# Patient Record
Sex: Male | Born: 1945 | Race: White | Hispanic: No | Marital: Married | Smoking: Never smoker
Health system: Southern US, Community
[De-identification: ages and names within clinical notes are randomized; demographics above are authoritative.]

## PROBLEM LIST (undated history)

## (undated) DIAGNOSIS — I10 Essential (primary) hypertension: Secondary | ICD-10-CM

## (undated) DIAGNOSIS — B001 Herpesviral vesicular dermatitis: Secondary | ICD-10-CM

## (undated) DIAGNOSIS — L570 Actinic keratosis: Secondary | ICD-10-CM

## (undated) DIAGNOSIS — Z6835 Body mass index (BMI) 35.0-35.9, adult: Secondary | ICD-10-CM

## (undated) HISTORY — DX: Herpesviral vesicular dermatitis: B00.1

## (undated) HISTORY — PX: COLONOSCOPY: SHX174

## (undated) HISTORY — DX: Actinic keratosis: L57.0

## (undated) HISTORY — PX: COLON SURGERY: SHX602

## (undated) HISTORY — PX: HERNIA REPAIR: SHX51

## (undated) HISTORY — DX: Essential (primary) hypertension: I10

## (undated) HISTORY — DX: Body mass index (BMI) 35.0-35.9, adult: Z68.35

---

## 2006-02-05 ENCOUNTER — Ambulatory Visit: Payer: Self-pay | Admitting: Gastroenterology

## 2007-05-28 ENCOUNTER — Ambulatory Visit: Payer: Self-pay | Admitting: Gastroenterology

## 2010-09-08 ENCOUNTER — Ambulatory Visit: Payer: Self-pay | Admitting: Emergency Medicine

## 2010-09-15 ENCOUNTER — Ambulatory Visit: Payer: Self-pay | Admitting: Emergency Medicine

## 2014-11-18 DIAGNOSIS — E669 Obesity, unspecified: Secondary | ICD-10-CM | POA: Insufficient documentation

## 2014-11-18 DIAGNOSIS — I10 Essential (primary) hypertension: Secondary | ICD-10-CM | POA: Insufficient documentation

## 2014-11-18 DIAGNOSIS — Z6835 Body mass index (BMI) 35.0-35.9, adult: Secondary | ICD-10-CM | POA: Insufficient documentation

## 2014-11-19 ENCOUNTER — Encounter: Payer: Self-pay | Admitting: Family Medicine

## 2014-11-24 ENCOUNTER — Ambulatory Visit (INDEPENDENT_AMBULATORY_CARE_PROVIDER_SITE_OTHER): Payer: BLUE CROSS/BLUE SHIELD | Admitting: Family Medicine

## 2014-11-24 ENCOUNTER — Encounter: Payer: Self-pay | Admitting: Family Medicine

## 2014-11-24 VITALS — BP 114/70 | HR 65 | Temp 99.1°F | Ht 68.0 in | Wt 233.0 lb

## 2014-11-24 DIAGNOSIS — L259 Unspecified contact dermatitis, unspecified cause: Secondary | ICD-10-CM | POA: Diagnosis not present

## 2014-11-24 DIAGNOSIS — L570 Actinic keratosis: Secondary | ICD-10-CM | POA: Diagnosis not present

## 2014-11-24 DIAGNOSIS — N401 Enlarged prostate with lower urinary tract symptoms: Secondary | ICD-10-CM | POA: Diagnosis not present

## 2014-11-24 DIAGNOSIS — Z0000 Encounter for general adult medical examination without abnormal findings: Secondary | ICD-10-CM

## 2014-11-24 DIAGNOSIS — I10 Essential (primary) hypertension: Secondary | ICD-10-CM

## 2014-11-24 DIAGNOSIS — N138 Other obstructive and reflux uropathy: Secondary | ICD-10-CM | POA: Insufficient documentation

## 2014-11-24 DIAGNOSIS — B001 Herpesviral vesicular dermatitis: Secondary | ICD-10-CM | POA: Diagnosis not present

## 2014-11-24 DIAGNOSIS — Z1211 Encounter for screening for malignant neoplasm of colon: Secondary | ICD-10-CM | POA: Diagnosis not present

## 2014-11-24 LAB — URINALYSIS, ROUTINE W REFLEX MICROSCOPIC
Bilirubin, UA: NEGATIVE
Glucose, UA: NEGATIVE
Ketones, UA: NEGATIVE
Leukocytes, UA: NEGATIVE
Nitrite, UA: NEGATIVE
Protein, UA: NEGATIVE
RBC, UA: NEGATIVE
Specific Gravity, UA: 1.015 (ref 1.005–1.030)
Urobilinogen, Ur: 0.2 mg/dL (ref 0.2–1.0)
pH, UA: 7 (ref 5.0–7.5)

## 2014-11-24 MED ORDER — TRIAMCINOLONE ACETONIDE 0.1 % EX CREA: 1.0000 "application " | TOPICAL_CREAM | Freq: Two times a day (BID) | CUTANEOUS | Status: DC

## 2014-11-24 MED ORDER — VALACYCLOVIR HCL 500 MG PO TABS: 1000.0000 mg | ORAL_TABLET | Freq: Two times a day (BID) | ORAL | Status: DC

## 2014-11-24 MED ORDER — BENAZEPRIL HCL 40 MG PO TABS
40.0000 mg | ORAL_TABLET | Freq: Every day | ORAL | Status: DC
Start: 2014-11-24 — End: 2015-11-30

## 2014-11-24 MED ORDER — TAMSULOSIN HCL 0.4 MG PO CAPS: 0.4000 mg | ORAL_CAPSULE | Freq: Every day | ORAL | Status: DC

## 2014-11-24 NOTE — Assessment & Plan Note (Signed)
Patient has nonspecific dermatitis from time to time uses triamcinolone cream on a rare basis

## 2014-11-24 NOTE — Assessment & Plan Note (Signed)
The current medical regimen is effective;  continue present plan and medications.  

## 2014-11-24 NOTE — Progress Notes (Signed)
BP 114/70 mmHg  Pulse 65  Temp(Src) 99.1 F (37.3 C)  Ht 5\' 8"  (1.727 m)  Wt 233 lb (105.688 kg)  BMI 35.44 kg/m2  SpO2 99%   Subjective:    Patient ID: Logan Willis, male    DOB: 01/28/1945, 69 y.o.   MRN: [id number]  HPI: Logan Willis is a 69 y.o. male  Chief Complaint  Patient presents with  . Annual Exam   Patient doing well for hypertension no complaints from medications taken faithfully. Patient does have nocturia gets up 3-4 times at night to urinate small amounts no frequency urgency dysuria Patient does have slow stream and slow to start.  Relevant past medical, surgical, family and social history reviewed and updated as indicated. Interim medical history since our last visit reviewed. Allergies and medications reviewed and updated.  Other than noted above Review of Systems  Constitutional: Negative.   HENT: Negative.   Eyes: Negative.   Respiratory: Negative.   Cardiovascular: Negative.   Gastrointestinal: Negative.   Endocrine: Negative.   Genitourinary: Negative.   Musculoskeletal: Negative.   Skin: Negative.   Allergic/Immunologic: Negative.   Neurological: Negative.   Hematological: Negative.   Psychiatric/Behavioral: Negative.     Per HPI unless specifically indicated above     Objective:    BP 114/70 mmHg  Pulse 65  Temp(Src) 99.1 F (37.3 C)  Ht 5\' 8"  (1.727 m)  Wt 233 lb (105.688 kg)  BMI 35.44 kg/m2  SpO2 99%  Wt Readings from Last 3 Encounters:  11/24/14 233 lb (105.688 kg)  05/05/14 233 lb (105.688 kg)    Physical Exam  Constitutional: He is oriented to person, place, and time. He appears well-developed and well-nourished.  HENT:  Head: Normocephalic and atraumatic.  Right Ear: External ear normal.  Left Ear: External ear normal.  Eyes: Conjunctivae and EOM are normal. Pupils are equal, round, and reactive to light.  Neck: Normal range of motion. Neck supple.  Cardiovascular: Normal rate, regular rhythm, normal heart  sounds and intact distal pulses.   Pulmonary/Chest: Effort normal and breath sounds normal.  Abdominal: Soft. Bowel sounds are normal. There is no splenomegaly or hepatomegaly.  Genitourinary: Rectum normal and penis normal.  Prostate enlarged  Musculoskeletal: Normal range of motion.  Neurological: He is alert and oriented to person, place, and time. He has normal reflexes.  Skin: No rash noted. No erythema.  Psychiatric: He has a normal mood and affect. His behavior is normal. Judgment and thought content normal.    No results found for this or any previous visit.    Assessment & Plan:   Problem List Items Addressed This Visit      Cardiovascular and Mediastinum   Essential hypertension    The current medical regimen is effective;  continue present plan and medications.       Relevant Medications   benazepril (LOTENSIN) 40 MG tablet   Other Relevant Orders   Comprehensive metabolic panel   Lipid panel   CBC with Differential/Platelet   TSH     Digestive   Cold sore    Uses Valtrex on a when necessary basis which works well      Relevant Medications   valACYclovir (VALTREX) 500 MG tablet     Musculoskeletal and Integument   Contact dermatitis    Patient has nonspecific dermatitis from time to time uses triamcinolone cream on a rare basis      Relevant Medications   triamcinolone cream (KENALOG) 0.1 %  Actinic keratosis    Due to multiple actinic keratosis will refer to dermatology Dr. Nehemiah Massed      Relevant Orders   Ambulatory referral to Dermatology     Genitourinary   BPH (benign prostatic hypertrophy) with urinary obstruction    Discuss with restriction at night and avoiding and use of medication Discussed Flomax and Proscar We will give prescription for patient to hold on Flomax      Relevant Medications   tamsulosin (FLOMAX) 0.4 MG CAPS capsule   Other Relevant Orders   Comprehensive metabolic panel   Lipid panel   CBC with  Differential/Platelet   PSA   Urinalysis, Routine w reflex microscopic (not at [facility])   TSH    Other Visit Diagnoses    Colon cancer screening    -  Primary    Relevant Orders    Ambulatory referral to General Surgery    PE (physical exam), annual            Follow up plan: Return in about 6 months (around 05/24/2015) for Blood pressure med check, BMP.

## 2014-11-24 NOTE — Assessment & Plan Note (Addendum)
Discuss with restriction at night and avoiding and use of medication Discussed Flomax and Proscar We will give prescription for patient to hold on Flomax

## 2014-11-24 NOTE — Assessment & Plan Note (Signed)
Due to multiple actinic keratosis will refer to dermatology Dr. Nehemiah Massed

## 2014-11-24 NOTE — Assessment & Plan Note (Signed)
Uses Valtrex on a when necessary basis which works well

## 2014-11-25 ENCOUNTER — Encounter: Payer: Self-pay | Admitting: Family Medicine

## 2014-11-25 ENCOUNTER — Other Ambulatory Visit: Payer: Self-pay

## 2014-11-25 ENCOUNTER — Telehealth: Payer: Self-pay

## 2014-11-25 LAB — CBC WITH DIFFERENTIAL/PLATELET
Basophils Absolute: 0 10*3/uL (ref 0.0–0.2)
Basos: 0 %
EOS (ABSOLUTE): 0.1 10*3/uL (ref 0.0–0.4)
Eos: 2 %
Hematocrit: 46 % (ref 37.5–51.0)
Hemoglobin: 15 g/dL (ref 12.6–17.7)
Immature Grans (Abs): 0 10*3/uL (ref 0.0–0.1)
Immature Granulocytes: 0 %
Lymphocytes Absolute: 1.1 10*3/uL (ref 0.7–3.1)
Lymphs: 26 %
MCH: 29.1 pg (ref 26.6–33.0)
MCHC: 32.6 g/dL (ref 31.5–35.7)
MCV: 89 fL (ref 79–97)
Monocytes Absolute: 0.3 10*3/uL (ref 0.1–0.9)
Monocytes: 7 %
Neutrophils Absolute: 2.7 10*3/uL (ref 1.4–7.0)
Neutrophils: 65 %
Platelets: 151 10*3/uL (ref 150–379)
RBC: 5.15 x10E6/uL (ref 4.14–5.80)
RDW: 14.7 % (ref 12.3–15.4)
WBC: 4.2 10*3/uL (ref 3.4–10.8)

## 2014-11-25 LAB — LIPID PANEL
Chol/HDL Ratio: 3.7 ratio units (ref 0.0–5.0)
Cholesterol, Total: 149 mg/dL (ref 100–199)
HDL: 40 mg/dL (ref >39)
LDL Calculated: 78 mg/dL (ref 0–99)
Triglycerides: 153 mg/dL — ABNORMAL HIGH (ref 0–149)
VLDL Cholesterol Cal: 31 mg/dL (ref 5–40)

## 2014-11-25 LAB — COMPREHENSIVE METABOLIC PANEL
ALT: 32 IU/L (ref 0–44)
AST: 18 IU/L (ref 0–40)
Albumin/Globulin Ratio: 2.2 (ref 1.1–2.5)
Albumin: 4.2 g/dL (ref 3.6–4.8)
Alkaline Phosphatase: 48 IU/L (ref 39–117)
BUN/Creatinine Ratio: 13 (ref 10–22)
BUN: 15 mg/dL (ref 8–27)
Bilirubin Total: 0.5 mg/dL (ref 0.0–1.2)
CO2: 26 mmol/L (ref 18–29)
Calcium: 9.3 mg/dL (ref 8.6–10.2)
Chloride: 102 mmol/L (ref 97–106)
Creatinine, Ser: 1.19 mg/dL (ref 0.76–1.27)
GFR calc Af Amer: 73 mL/min/{1.73_m2} (ref >59)
GFR calc non Af Amer: 63 mL/min/{1.73_m2} (ref >59)
Globulin, Total: 1.9 g/dL (ref 1.5–4.5)
Glucose: 80 mg/dL (ref 65–99)
Potassium: 4.8 mmol/L (ref 3.5–5.2)
Sodium: 142 mmol/L (ref 136–144)
Total Protein: 6.1 g/dL (ref 6.0–8.5)

## 2014-11-25 LAB — TSH: TSH: 2.35 u[IU]/mL (ref 0.450–4.500)

## 2014-11-25 LAB — PSA: Prostate Specific Ag, Serum: 1.9 ng/mL (ref 0.0–4.0)

## 2014-11-25 NOTE — Telephone Encounter (Signed)
Gastroenterology Pre-Procedure Review  Request Date: 12/10/14 Requesting Physician: Dr. Jeananne Rama  PATIENT REVIEW QUESTIONS: The patient responded to the following health history questions as indicated:    1. Are you having any GI issues? no 2. Do you have a personal history of Polyps? yes (repeat 3 years) 3. Do you have a family history of Colon Cancer or Polyps? no 4. Diabetes Mellitus? no 5. Joint replacements in the past 12 months?no 6. Major health problems in the past 3 months?no 7. Any artificial heart valves, MVP, or defibrillator?no    MEDICATIONS & ALLERGIES:    Patient reports the following regarding taking any anticoagulation/antiplatelet therapy:   Plavix, Coumadin, Eliquis, Xarelto, Lovenox, Pradaxa, Brilinta, or Effient? no Aspirin? yes (asa 81mg )  Patient confirms/reports the following medications:  Current Outpatient Prescriptions  Medication Sig Dispense Refill  . aspirin EC 81 MG tablet Take 81 mg by mouth daily.    . benazepril (LOTENSIN) 40 MG tablet Take 1 tablet (40 mg total) by mouth daily. 90 tablet 4  . tamsulosin (FLOMAX) 0.4 MG CAPS capsule Take 1 capsule (0.4 mg total) by mouth daily. 90 capsule 4  . triamcinolone cream (KENALOG) 0.1 % Apply 1 application topically 2 (two) times daily. 30 g 0  . valACYclovir (VALTREX) 500 MG tablet Take 2 tablets (1,000 mg total) by mouth 2 (two) times daily. 28 tablet 12   No current facility-administered medications for this visit.    Patient confirms/reports the following allergies:  No Known Allergies  No orders of the defined types were placed in this encounter.    AUTHORIZATION INFORMATION Primary Insurance: 1D#: Group #:  Secondary Insurance: 1D#: Group #:  SCHEDULE INFORMATION: Date: 12/10/14 Time: Location: [place]

## 2014-11-30 ENCOUNTER — Encounter: Payer: Self-pay | Admitting: *Deleted

## 2014-12-08 NOTE — Discharge Instructions (Signed)

## 2014-12-10 ENCOUNTER — Ambulatory Visit: Payer: BLUE CROSS/BLUE SHIELD | Admitting: Anesthesiology

## 2014-12-10 ENCOUNTER — Encounter
Admission: RE | Disposition: A | Discharge status: Home / Self Care | Payer: Self-pay | Source: Ambulatory Visit | Attending: Gastroenterology

## 2014-12-10 ENCOUNTER — Ambulatory Visit
Admission: RE | Admit: 2014-12-10 | Discharge: 2014-12-10 | Disposition: A | Discharge status: Home / Self Care | Payer: BLUE CROSS/BLUE SHIELD | Source: Ambulatory Visit | Attending: Gastroenterology | Admitting: Gastroenterology

## 2014-12-10 ENCOUNTER — Other Ambulatory Visit: Payer: Self-pay | Admitting: Gastroenterology

## 2014-12-10 DIAGNOSIS — D122 Benign neoplasm of ascending colon: Secondary | ICD-10-CM | POA: Insufficient documentation

## 2014-12-10 DIAGNOSIS — Z860100 Personal history of colon polyps, unspecified: Secondary | ICD-10-CM | POA: Insufficient documentation

## 2014-12-10 DIAGNOSIS — D123 Benign neoplasm of transverse colon: Secondary | ICD-10-CM

## 2014-12-10 DIAGNOSIS — Z1211 Encounter for screening for malignant neoplasm of colon: Secondary | ICD-10-CM | POA: Insufficient documentation

## 2014-12-10 DIAGNOSIS — D125 Benign neoplasm of sigmoid colon: Secondary | ICD-10-CM | POA: Insufficient documentation

## 2014-12-10 DIAGNOSIS — Z6835 Body mass index (BMI) 35.0-35.9, adult: Secondary | ICD-10-CM | POA: Insufficient documentation

## 2014-12-10 DIAGNOSIS — Z79899 Other long term (current) drug therapy: Secondary | ICD-10-CM | POA: Insufficient documentation

## 2014-12-10 DIAGNOSIS — D124 Benign neoplasm of descending colon: Secondary | ICD-10-CM | POA: Insufficient documentation

## 2014-12-10 DIAGNOSIS — Z7982 Long term (current) use of aspirin: Secondary | ICD-10-CM | POA: Diagnosis not present

## 2014-12-10 HISTORY — PX: COLONOSCOPY WITH PROPOFOL: SHX5780

## 2014-12-10 SURGERY — COLONOSCOPY WITH PROPOFOL
Anesthesia: Monitor Anesthesia Care | Wound class: Clean Contaminated

## 2014-12-10 MED ORDER — LACTATED RINGERS IV SOLN
INTRAVENOUS | Status: DC
  Administered 2014-12-10: 08:00:00 via INTRAVENOUS

## 2014-12-10 MED ORDER — LIDOCAINE HCL (CARDIAC) 20 MG/ML IV SOLN
INTRAVENOUS | Status: DC | PRN
  Administered 2014-12-10: 40 mg via INTRAVENOUS

## 2014-12-10 MED ORDER — ACETAMINOPHEN 325 MG PO TABS: 325.0000 mg | ORAL_TABLET | ORAL | Status: DC | PRN

## 2014-12-10 MED ORDER — ACETAMINOPHEN 160 MG/5ML PO SOLN: 325.0000 mg | ORAL | Status: DC | PRN

## 2014-12-10 MED ORDER — STERILE WATER FOR IRRIGATION IR SOLN
Status: DC | PRN
  Administered 2014-12-10: 200 mL

## 2014-12-10 MED ORDER — SODIUM CHLORIDE 0.9 % IV SOLN: INTRAVENOUS | Status: DC

## 2014-12-10 MED ORDER — PROPOFOL 10 MG/ML IV BOLUS
INTRAVENOUS | Status: DC | PRN
  Administered 2014-12-10 (×6): 20 mg via INTRAVENOUS

## 2014-12-10 SURGICAL SUPPLY — 30 items
CANISTER SUCT 1200ML W/VALVE (MISCELLANEOUS) ×2 IMPLANT
FCP ESCP3.2XJMB 240X2.8X (MISCELLANEOUS)
FORCEPS BIOP RAD 4 LRG CAP 4 (CUTTING FORCEPS) IMPLANT
FORCEPS BIOP RJ4 240 W/NDL (MISCELLANEOUS)
FORCEPS ESCP3.2XJMB 240X2.8X (MISCELLANEOUS) IMPLANT
GOWN CVR UNV OPN BCK APRN NK (MISCELLANEOUS) ×2 IMPLANT
GOWN ISOL THUMB LOOP REG UNIV (MISCELLANEOUS) ×2
HEMOCLIP INSTINCT (CLIP) IMPLANT
INJECTOR VARIJECT VIN23 (MISCELLANEOUS) IMPLANT
KIT CO2 TUBING (TUBING) ×2 IMPLANT
KIT DEFENDO VALVE AND CONN (KITS) IMPLANT
KIT ENDO PROCEDURE OLY (KITS) ×2 IMPLANT
LIGATOR MULTIBAND 6SHOOTER MBL (MISCELLANEOUS) IMPLANT
MARKER SPOT ENDO TATTOO 5ML (MISCELLANEOUS) IMPLANT
PAD GROUND ADULT SPLIT (MISCELLANEOUS) IMPLANT
RESOLUTION 360 CLIP ×2 IMPLANT
SNARE SHORT THROW 13M SML OVAL (MISCELLANEOUS) ×2 IMPLANT
SNARE SHORT THROW 30M LRG OVAL (MISCELLANEOUS) IMPLANT
SPOT EX ENDOSCOPIC TATTOO (MISCELLANEOUS)
SUCTION POLY TRAP 4CHAMBER (MISCELLANEOUS) IMPLANT
TRAP SUCTION POLY (MISCELLANEOUS) IMPLANT
TUBING CONN 6MMX3.1M (TUBING)
TUBING SUCTION CONN 0.25 STRL (TUBING) IMPLANT
UNDERPAD 30X60 958B10 (PK) (MISCELLANEOUS) IMPLANT
VALVE BIOPSY ENDO (VALVE) IMPLANT
VARIJECT INJECTOR VIN23 (MISCELLANEOUS)
WATER AUXILLARY (MISCELLANEOUS) IMPLANT
WATER STERILE IRR 250ML POUR (IV SOLUTION) ×2 IMPLANT
WATER STERILE IRR 500ML POUR (IV SOLUTION) ×2 IMPLANT
WIDE-EYE POLYPTRAP (MISCELLANEOUS) ×6 IMPLANT

## 2014-12-10 NOTE — Transfer of Care (Signed)
Immediate Anesthesia Transfer of Care Note  Patient: Logan Willis  Procedure(s) Performed: Procedure(s): COLONOSCOPY WITH PROPOFOL (N/A)  Patient Location: PACU  Anesthesia Type: MAC  Level of Consciousness: awake, alert  and patient cooperative  Airway and Oxygen Therapy: Patient Spontanous Breathing and Patient connected to supplemental oxygen  Post-op Assessment: Post-op Vital signs reviewed, Patient's Cardiovascular Status Stable, Respiratory Function Stable, Patent Airway and No signs of Nausea or vomiting  Post-op Vital Signs: Reviewed and stable  Complications: No apparent anesthesia complications

## 2014-12-10 NOTE — Op Note (Addendum)
[facility] Gastroenterology Patient Name: Logan Willis Procedure Date: 12/10/2014 7:54 AM MRN: [id number] Account #: [id number] Date of Birth: 1946/01/08 Admit Type: Outpatient Age: 69 Room: [facility] OR ROOM 01 Gender: Male Note Status: Supervisor Override Procedure:         Colonoscopy Indications:       High risk colon cancer surveillance: Personal history of                     colonic polyps Providers:         Lucilla Lame, MD Referring MD:      Guadalupe Maple, MD (Referring MD) Medicines:         Propofol per Anesthesia Complications:     No immediate complications. Procedure:         Pre-Anesthesia Assessment:                    - Prior to the procedure, a History and Physical was                     performed, and patient medications and allergies were                     reviewed. The patient's tolerance of previous anesthesia                     was also reviewed. The risks and benefits of the procedure                     and the sedation options and risks were discussed with the                     patient. All questions were answered, and informed consent                     was obtained. Prior Anticoagulants: The patient has taken                     no previous anticoagulant or antiplatelet agents. ASA                     Grade Assessment: II - A patient with mild systemic                     disease. After reviewing the risks and benefits, the                     patient was deemed in satisfactory condition to undergo                     the procedure.                    After obtaining informed consent, the colonoscope was                     passed under direct vision. Throughout the procedure, the                     patient's blood pressure, pulse, and oxygen saturations                     were monitored continuously. The [place]  colonoscope (S#: [id number]) was introduced through the anus                     and  advanced to the the ileocolonic anastomosis. The                     colonoscopy was performed without difficulty. The patient                     tolerated the procedure well. The quality of the bowel                     preparation was excellent. Findings:      The perianal and digital rectal examinations were normal.      Four sessile polyps were found in the ascending colon. The polyps were 4       to 9 mm in size. These polyps were removed with a cold snare. Resection       and retrieval were complete. To prevent bleeding post-intervention, one       hemostatic clip was successfully placed (MRI compatible). There was no       bleeding at the end of the procedure.      A 3 mm polyp was found in the sigmoid colon. The polyp was sessile. The       polyp was removed with a cold biopsy forceps. Resection and retrieval       were complete.      There was evidence of a prior end-to-side ileo-colonic anastomosis in       the transverse colon. This was patent. This was characterized by healthy       appearing mucosa. This was traversed. Impression:        - Four 4 to 9 mm polyps in the ascending colon. Resected                     and retrieved. MRI-compatible clip was placed.                    - One 3 mm polyp in the sigmoid colon. Resected and                     retrieved.                    - Patent end-to-side ileo-colonic anastomosis,                     characterized by healthy appearing mucosa. Recommendation:    - Await pathology results.                    - Repeat colonoscopy in 4 years for surveillance. Lucilla Lame, MD 12/10/2014 8:22:36 AM This report has been signed electronically. Number of Addenda: 0 Note Initiated On: 12/10/2014 7:54 AM Scope Withdrawal Time: 0 hours 9 minutes 52 seconds  Total Procedure Duration: 0 hours 15 minutes 41 seconds       [facility]

## 2014-12-10 NOTE — Anesthesia Postprocedure Evaluation (Signed)
Anesthesia Post Note  Patient: Logan Willis  Procedure(s) Performed: Procedure(s) (LRB): COLONOSCOPY WITH PROPOFOL (N/A)  Patient location during evaluation: PACU Anesthesia Type: MAC Level of consciousness: awake Pain management: pain level controlled Vital Signs Assessment: post-procedure vital signs reviewed and stable Respiratory status: spontaneous breathing Cardiovascular status: blood pressure returned to baseline Anesthetic complications: no    Trecia Rogers

## 2014-12-10 NOTE — H&P (Signed)
  [facility]  [address], [place], [place] [address] Phone: [phone] Fax : [phone]  Primary Care Physician:  Golden Pop, MD Primary Gastroenterologist:  Dr. Allen Norris  Pre-Procedure History & Physical: HPI:  Logan Willis is a 69 y.o. male is here for an colonoscopy.   Past Medical History  Diagnosis Date  . Body mass index 35.0-35.9, adult   . Cold sore     Past Surgical History  Procedure Laterality Date  . Hernia repair    . Colon surgery    . Colonoscopy      Prior to Admission medications   Medication Sig Start Date End Date Taking? Authorizing Provider  aspirin EC 81 MG tablet Take 81 mg by mouth daily.   Yes Historical Provider, MD  benazepril (LOTENSIN) 40 MG tablet Take 1 tablet (40 mg total) by mouth daily. 11/24/14  Yes Guadalupe Maple, MD  valACYclovir (VALTREX) 500 MG tablet Take 2 tablets (1,000 mg total) by mouth 2 (two) times daily. Patient taking differently: Take 1,000 mg by mouth 2 (two) times daily as needed.  11/24/14  Yes Guadalupe Maple, MD  tamsulosin (FLOMAX) 0.4 MG CAPS capsule Take 1 capsule (0.4 mg total) by mouth daily. Patient not taking: Reported on 11/25/2014 11/24/14   Guadalupe Maple, MD  triamcinolone cream (KENALOG) 0.1 % Apply 1 application topically 2 (two) times daily. Patient not taking: Reported on 12/10/2014 11/24/14   Guadalupe Maple, MD    Allergies as of 11/25/2014  . (No Known Allergies)    Family History  Problem Relation Age of Onset  . Heart disease Mother 13  . Heart disease Father 21  . Diabetes Sister     Social History   Social History  . Marital Status: Married    Spouse Name: N/A  . Number of Children: N/A  . Years of Education: N/A   Occupational History  . Not on file.   Social History Main Topics  . Smoking status: Never Smoker   . Smokeless tobacco: Never Used  . Alcohol Use: No  . Drug Use: No  . Sexual Activity: Not on file   Other Topics Concern  . Not on  file   Social History Narrative    Review of Systems: See HPI, otherwise negative ROS  Physical Exam: BP 121/75 mmHg  Pulse 75  Temp(Src) 97.9 F (36.6 C) (Temporal)  Resp 16  Ht 5\' 8"  (1.727 m)  Wt 227 lb (102.967 kg)  BMI 34.52 kg/m2  SpO2 98% General:   Alert,  pleasant and cooperative in NAD Head:  Normocephalic and atraumatic. Neck:  Supple; no masses or thyromegaly. Lungs:  Clear throughout to auscultation.    Heart:  Regular rate and rhythm. Abdomen:  Soft, nontender and nondistended. Normal bowel sounds, without guarding, and without rebound.   Neurologic:  Alert and  oriented x4;  grossly normal neurologically.  Impression/Plan: Logan Willis is here for an colonoscopy to be performed for history of colon polyps  Risks, benefits, limitations, and alternatives regarding  colonoscopy have been reviewed with the patient.  Questions have been answered.  All parties agreeable.   Ollen Bowl, MD  12/10/2014, 7:27 AM

## 2014-12-10 NOTE — Anesthesia Procedure Notes (Signed)
Procedure Name: MAC Performed by: Georga Bora Pre-anesthesia Checklist: Patient identified, Emergency Drugs available, Suction available, Patient being monitored and Timeout performed Patient Re-evaluated:Patient Re-evaluated prior to inductionOxygen Delivery Method: Nasal cannula Placement Confirmation: positive ETCO2

## 2014-12-10 NOTE — Anesthesia Preprocedure Evaluation (Signed)
Anesthesia Evaluation  Patient identified by MRN, date of birth, ID band Patient awake and Patient confused    Reviewed: Allergy & Precautions, H&P , NPO status , Patient's Chart, lab work & pertinent test results, Unable to perform ROS - Chart review only  Airway Mallampati: I  TM Distance: >3 FB Neck ROM: full  Mouth opening: Limited Mouth Opening  Dental no notable dental hx.    Pulmonary neg pulmonary ROS,    Pulmonary exam normal        Cardiovascular hypertension, Normal cardiovascular exam     Neuro/Psych negative neurological ROS  negative psych ROS   GI/Hepatic negative GI ROS, Neg liver ROS,   Endo/Other  negative endocrine ROS  Renal/GU negative Renal ROS  negative genitourinary   Musculoskeletal   Abdominal   Peds  Hematology negative hematology ROS (+)   Anesthesia Other Findings   Reproductive/Obstetrics negative OB ROS                             Anesthesia Physical Anesthesia Plan  ASA: II  Anesthesia Plan: MAC   Post-op Pain Management:    Induction: Intravenous  Airway Management Planned: Nasal Cannula  Additional Equipment:   Intra-op Plan:   Post-operative Plan:   Informed Consent: I have reviewed the patients History and Physical, chart, labs and discussed the procedure including the risks, benefits and alternatives for the proposed anesthesia with the patient or authorized representative who has indicated his/her understanding and acceptance.     Plan Discussed with: CRNA  Anesthesia Plan Comments:         Anesthesia Quick Evaluation

## 2014-12-10 NOTE — OR Nursing (Signed)
PATIENT HAS HAD SURGERY, HAS NO CECUM, REACHED EXTENT/ ANASTOMOSIS AT 0807.

## 2014-12-11 ENCOUNTER — Encounter: Payer: Self-pay | Admitting: Gastroenterology

## 2014-12-15 ENCOUNTER — Encounter: Payer: Self-pay | Admitting: Gastroenterology

## 2015-05-24 ENCOUNTER — Ambulatory Visit (INDEPENDENT_AMBULATORY_CARE_PROVIDER_SITE_OTHER): Payer: BLUE CROSS/BLUE SHIELD | Admitting: Family Medicine

## 2015-05-24 ENCOUNTER — Encounter: Payer: Self-pay | Admitting: Family Medicine

## 2015-05-24 VITALS — BP 120/62 | HR 66 | Temp 98.0°F | Ht 68.1 in | Wt 228.0 lb

## 2015-05-24 DIAGNOSIS — N138 Other obstructive and reflux uropathy: Secondary | ICD-10-CM

## 2015-05-24 DIAGNOSIS — N401 Enlarged prostate with lower urinary tract symptoms: Secondary | ICD-10-CM | POA: Diagnosis not present

## 2015-05-24 DIAGNOSIS — Z0000 Encounter for general adult medical examination without abnormal findings: Secondary | ICD-10-CM | POA: Diagnosis not present

## 2015-05-24 DIAGNOSIS — I10 Essential (primary) hypertension: Secondary | ICD-10-CM | POA: Diagnosis not present

## 2015-05-24 NOTE — Progress Notes (Signed)
   BP 120/62 mmHg  Pulse 66  Temp(Src) 98 F (36.7 C)  Ht 5' 8.1" (1.73 m)  Wt 228 lb (103.42 kg)  BMI 34.56 kg/m2  SpO2 98%   Subjective:    Patient ID: Logan Willis, male    DOB: 22-Apr-1945, 70 y.o.   MRN: [id number]  HPI: Logan Willis is a 70 y.o. male  Chief Complaint  Patient presents with  . Hypertension  Patient recheck hypertension complaints from Benzapril no lightheaded dizzy so otherwise feeling well. Takes medicines faithfully without side effects. Patient has some BPH with lower urinary tract symptoms never started tamsulosin is ready to do so still having symptoms. Has lost 5 pounds and doing well feeling good On some psoriasis issues triamcinolone helps but still has some itching skin problems is seen dermatologist for actinic keratosis has follow-up appointment in August will check again on other treatments for psoriasis. Uses triamcinolone sparingly.  Relevant past medical, surgical, family and social history reviewed and updated as indicated. Interim medical history since our last visit reviewed. Allergies and medications reviewed and updated.  Review of Systems  Constitutional: Negative.   Respiratory: Negative.   Cardiovascular: Negative.   Genitourinary: Positive for difficulty urinating.    Per HPI unless specifically indicated above     Objective:    BP 120/62 mmHg  Pulse 66  Temp(Src) 98 F (36.7 C)  Ht 5' 8.1" (1.73 m)  Wt 228 lb (103.42 kg)  BMI 34.56 kg/m2  SpO2 98%  Wt Readings from Last 3 Encounters:  05/24/15 228 lb (103.42 kg)  12/10/14 227 lb (102.967 kg)  11/24/14 233 lb (105.688 kg)    Physical Exam  Constitutional: He is oriented to person, place, and time. He appears well-developed and well-nourished. No distress.  HENT:  Head: Normocephalic and atraumatic.  Right Ear: Hearing normal.  Left Ear: Hearing normal.  Nose: Nose normal.  Eyes: Conjunctivae and lids are normal. Right eye exhibits no discharge. Left eye  exhibits no discharge. No scleral icterus.  Cardiovascular: Normal rate, regular rhythm and normal heart sounds.   Pulmonary/Chest: Effort normal and breath sounds normal. No respiratory distress.  Musculoskeletal: Normal range of motion.  Neurological: He is alert and oriented to person, place, and time.  Skin: Skin is intact.  Slight psoriasis changes on legs  Psychiatric: He has a normal mood and affect. His speech is normal and behavior is normal. Judgment and thought content normal. Cognition and memory are normal.        Assessment & Plan:   Problem List Items Addressed This Visit      Cardiovascular and Mediastinum   Essential hypertension - Primary    The current medical regimen is effective;  continue present plan and medications.       Relevant Orders   Basic metabolic panel     Genitourinary   BPH (benign prostatic hypertrophy) with urinary obstruction    Will start tamsulosin and observe symptoms       Other Visit Diagnoses    Healthcare maintenance        Relevant Orders    Hepatitis C Antibody     will continue to use triamcinolone when necessary and sparingly also discuss with dermatology at next visit.  Follow up plan: Return in about 6 months (around 11/24/2015) for Physical Exam.

## 2015-05-24 NOTE — Assessment & Plan Note (Signed)
The current medical regimen is effective;  continue present plan and medications.  

## 2015-05-24 NOTE — Assessment & Plan Note (Signed)
Will start tamsulosin and observe symptoms

## 2015-05-25 ENCOUNTER — Encounter: Payer: Self-pay | Admitting: Family Medicine

## 2015-05-25 LAB — BASIC METABOLIC PANEL
BUN/Creatinine Ratio: 18 (ref 10–24)
BUN: 22 mg/dL (ref 8–27)
CO2: 24 mmol/L (ref 18–29)
Calcium: 9.2 mg/dL (ref 8.6–10.2)
Chloride: 102 mmol/L (ref 96–106)
Creatinine, Ser: 1.19 mg/dL (ref 0.76–1.27)
GFR calc Af Amer: 73 mL/min/{1.73_m2} (ref >59)
GFR calc non Af Amer: 63 mL/min/{1.73_m2} (ref >59)
Glucose: 71 mg/dL (ref 65–99)
Potassium: 4.5 mmol/L (ref 3.5–5.2)
Sodium: 140 mmol/L (ref 134–144)

## 2015-05-25 LAB — HEPATITIS C ANTIBODY: Hep C Virus Ab: <0.1 s/co ratio (ref 0.0–0.9)

## 2015-11-17 ENCOUNTER — Encounter (INDEPENDENT_AMBULATORY_CARE_PROVIDER_SITE_OTHER): Payer: Self-pay

## 2015-11-30 ENCOUNTER — Ambulatory Visit (INDEPENDENT_AMBULATORY_CARE_PROVIDER_SITE_OTHER): Payer: BLUE CROSS/BLUE SHIELD | Admitting: Family Medicine

## 2015-11-30 ENCOUNTER — Encounter: Payer: Self-pay | Admitting: Family Medicine

## 2015-11-30 VITALS — BP 123/76 | HR 61 | Temp 97.6°F | Ht 68.0 in | Wt 229.0 lb

## 2015-11-30 DIAGNOSIS — Z0000 Encounter for general adult medical examination without abnormal findings: Secondary | ICD-10-CM

## 2015-11-30 DIAGNOSIS — I10 Essential (primary) hypertension: Secondary | ICD-10-CM

## 2015-11-30 DIAGNOSIS — N401 Enlarged prostate with lower urinary tract symptoms: Secondary | ICD-10-CM

## 2015-11-30 DIAGNOSIS — Z23 Encounter for immunization: Secondary | ICD-10-CM | POA: Diagnosis not present

## 2015-11-30 DIAGNOSIS — L259 Unspecified contact dermatitis, unspecified cause: Secondary | ICD-10-CM

## 2015-11-30 DIAGNOSIS — Z6835 Body mass index (BMI) 35.0-35.9, adult: Secondary | ICD-10-CM

## 2015-11-30 DIAGNOSIS — B001 Herpesviral vesicular dermatitis: Secondary | ICD-10-CM

## 2015-11-30 DIAGNOSIS — N138 Other obstructive and reflux uropathy: Secondary | ICD-10-CM | POA: Diagnosis not present

## 2015-11-30 LAB — URINALYSIS, ROUTINE W REFLEX MICROSCOPIC
Bilirubin, UA: NEGATIVE
Glucose, UA: NEGATIVE
Ketones, UA: NEGATIVE
Leukocytes, UA: NEGATIVE
Nitrite, UA: NEGATIVE
Protein, UA: NEGATIVE
RBC, UA: NEGATIVE
Specific Gravity, UA: 1.01 (ref 1.005–1.030)
Urobilinogen, Ur: 0.2 mg/dL (ref 0.2–1.0)
pH, UA: 6.5 (ref 5.0–7.5)

## 2015-11-30 MED ORDER — TRIAMCINOLONE ACETONIDE 0.1 % EX CREA: 1.0000 "application " | TOPICAL_CREAM | Freq: Two times a day (BID) | CUTANEOUS | 0 refills | Status: DC

## 2015-11-30 MED ORDER — VALACYCLOVIR HCL 500 MG PO TABS
1000.0000 mg | ORAL_TABLET | Freq: Two times a day (BID) | ORAL | 12 refills | Status: DC
Start: 2015-11-30 — End: 2017-01-29

## 2015-11-30 MED ORDER — BENAZEPRIL HCL 40 MG PO TABS: 40.0000 mg | ORAL_TABLET | Freq: Every day | ORAL | 4 refills | Status: DC

## 2015-11-30 NOTE — Assessment & Plan Note (Signed)
Doing well now does not want meds

## 2015-11-30 NOTE — Assessment & Plan Note (Signed)
The current medical regimen is effective;  continue present plan and medications.  

## 2015-11-30 NOTE — Assessment & Plan Note (Signed)
Discuss wt loss 

## 2015-11-30 NOTE — Progress Notes (Signed)
BP 123/76   Pulse 61   Temp 97.6 F (36.4 C)   Ht 5\' 8"  (1.727 m)   Wt 229 lb (103.9 kg)   SpO2 99%   BMI 34.82 kg/m    Subjective:    Patient ID: Logan Willis, male    DOB: [birth year], 70 y.o.   MRN: [id number]  HPI: Logan Willis is a 70 y.o. male  Chief Complaint  Patient presents with  . Annual Exam  Patient all in all doing well had some allergy issues this summer. Stopped taking Flomax as didn't do anything and may be aggravating his allergy symptoms. Didn't notice whether was doing any good or not so hasn't restarted the medicine. Blood pressure doing well with Benzapril no side effects no issues Also discussed sleep apnea patient currently doesn't have any symptoms but will watch for any changes.  Relevant past medical, surgical, family and social history reviewed and updated as indicated. Interim medical history since our last visit reviewed. Allergies and medications reviewed and updated.  Review of Systems  Constitutional: Negative.   HENT: Negative.   Eyes: Negative.   Respiratory: Negative.   Cardiovascular: Negative.   Gastrointestinal: Negative.   Endocrine: Negative.   Genitourinary: Negative.   Musculoskeletal: Negative.   Skin: Negative.   Allergic/Immunologic: Negative.   Neurological: Negative.   Hematological: Negative.   Psychiatric/Behavioral: Negative.     Per HPI unless specifically indicated above     Objective:    BP 123/76   Pulse 61   Temp 97.6 F (36.4 C)   Ht 5\' 8"  (1.727 m)   Wt 229 lb (103.9 kg)   SpO2 99%   BMI 34.82 kg/m   Wt Readings from Last 3 Encounters:  11/30/15 229 lb (103.9 kg)  05/24/15 228 lb (103.4 kg)  12/10/14 227 lb (103 kg)    Physical Exam  Constitutional: He is oriented to person, place, and time. He appears well-developed and well-nourished.  HENT:  Head: Normocephalic and atraumatic.  Right Ear: External ear normal.  Left Ear: External ear normal.  Eyes: Conjunctivae and EOM are normal.  Pupils are equal, round, and reactive to light.  Neck: Normal range of motion. Neck supple.  Cardiovascular: Normal rate, regular rhythm, normal heart sounds and intact distal pulses.   Pulmonary/Chest: Effort normal and breath sounds normal.  Abdominal: Soft. Bowel sounds are normal. There is no splenomegaly or hepatomegaly.  Genitourinary: Rectum normal and penis normal.  Genitourinary Comments: Enlarged prostate  Musculoskeletal: Normal range of motion.  Neurological: He is alert and oriented to person, place, and time. He has normal reflexes.  Skin: No rash noted. No erythema.  Psychiatric: He has a normal mood and affect. His behavior is normal. Judgment and thought content normal.    Results for orders placed or performed in visit on [id number]  Basic metabolic panel  Result Value Ref Range   Glucose 71 65 - 99 mg/dL   BUN 22 8 - 27 mg/dL   Creatinine, Ser 1.19 0.76 - 1.27 mg/dL   GFR calc non Af Amer 63 >59 mL/min/1.73   GFR calc Af Amer 73 >59 mL/min/1.73   BUN/Creatinine Ratio 18 10 - 24   Sodium 140 134 - 144 mmol/L   Potassium 4.5 3.5 - 5.2 mmol/L   Chloride 102 96 - 106 mmol/L   CO2 24 18 - 29 mmol/L   Calcium 9.2 8.6 - 10.2 mg/dL  Hepatitis C Antibody  Result Value Ref Range   Hep  C Virus Ab <0.1 0.0 - 0.9 s/co ratio      Assessment & Plan:   Problem List Items Addressed This Visit      Cardiovascular and Mediastinum   Essential hypertension - Primary    The current medical regimen is effective;  continue present plan and medications.       Relevant Medications   benazepril (LOTENSIN) 40 MG tablet   Other Relevant Orders   CBC with Differential/Platelet   Comprehensive metabolic panel   Lipid Profile   Urinalysis, Routine w reflex microscopic (not at [facility])   PSA   TSH     Digestive   Cold sore   Relevant Medications   valACYclovir (VALTREX) 500 MG tablet     Musculoskeletal and Integument   Contact dermatitis   Relevant Medications    triamcinolone cream (KENALOG) 0.1 %     Genitourinary   Benign prostatic hyperplasia with urinary obstruction    Doing well now does not want meds        Other   Body mass index 35.0-35.9, adult    Discuss wt loss       Other Visit Diagnoses    PE (physical exam), annual       Need for diphtheria-tetanus-pertussis (Tdap) vaccine       Relevant Orders   Tdap vaccine greater than or equal to 7yo IM (Completed)       Follow up plan: Return for BMP.

## 2015-12-01 ENCOUNTER — Encounter: Payer: Self-pay | Admitting: Family Medicine

## 2015-12-01 LAB — COMPREHENSIVE METABOLIC PANEL
ALT: 23 IU/L (ref 0–44)
AST: 17 IU/L (ref 0–40)
Albumin/Globulin Ratio: 2.4 — ABNORMAL HIGH (ref 1.2–2.2)
Albumin: 4.6 g/dL (ref 3.6–4.8)
Alkaline Phosphatase: 58 IU/L (ref 39–117)
BUN/Creatinine Ratio: 16 (ref 10–24)
BUN: 18 mg/dL (ref 8–27)
Bilirubin Total: 0.5 mg/dL (ref 0.0–1.2)
CO2: 25 mmol/L (ref 18–29)
Calcium: 9.2 mg/dL (ref 8.6–10.2)
Chloride: 103 mmol/L (ref 96–106)
Creatinine, Ser: 1.1 mg/dL (ref 0.76–1.27)
GFR calc Af Amer: 79 mL/min/{1.73_m2} (ref >59)
GFR calc non Af Amer: 69 mL/min/{1.73_m2} (ref >59)
Globulin, Total: 1.9 g/dL (ref 1.5–4.5)
Glucose: 64 mg/dL — ABNORMAL LOW (ref 65–99)
Potassium: 4.1 mmol/L (ref 3.5–5.2)
Sodium: 145 mmol/L — ABNORMAL HIGH (ref 134–144)
Total Protein: 6.5 g/dL (ref 6.0–8.5)

## 2015-12-01 LAB — CBC WITH DIFFERENTIAL/PLATELET
Basophils Absolute: 0 10*3/uL (ref 0.0–0.2)
Basos: 0 %
EOS (ABSOLUTE): 0.1 10*3/uL (ref 0.0–0.4)
Eos: 2 %
Hematocrit: 44 % (ref 37.5–51.0)
Hemoglobin: 14.9 g/dL (ref 12.6–17.7)
Immature Grans (Abs): 0 10*3/uL (ref 0.0–0.1)
Immature Granulocytes: 0 %
Lymphocytes Absolute: 1.2 10*3/uL (ref 0.7–3.1)
Lymphs: 26 %
MCH: 28.8 pg (ref 26.6–33.0)
MCHC: 33.9 g/dL (ref 31.5–35.7)
MCV: 85 fL (ref 79–97)
Monocytes Absolute: 0.5 10*3/uL (ref 0.1–0.9)
Monocytes: 11 %
Neutrophils Absolute: 2.7 10*3/uL (ref 1.4–7.0)
Neutrophils: 61 %
Platelets: 166 10*3/uL (ref 150–379)
RBC: 5.17 x10E6/uL (ref 4.14–5.80)
RDW: 13.8 % (ref 12.3–15.4)
WBC: 4.5 10*3/uL (ref 3.4–10.8)

## 2015-12-01 LAB — PSA: Prostate Specific Ag, Serum: 1.9 ng/mL (ref 0.0–4.0)

## 2015-12-01 LAB — LIPID PANEL
Chol/HDL Ratio: 3.9 ratio units (ref 0.0–5.0)
Cholesterol, Total: 157 mg/dL (ref 100–199)
HDL: 40 mg/dL (ref >39)
LDL Calculated: 94 mg/dL (ref 0–99)
Triglycerides: 115 mg/dL (ref 0–149)
VLDL Cholesterol Cal: 23 mg/dL (ref 5–40)

## 2015-12-01 LAB — TSH: TSH: 1.65 u[IU]/mL (ref 0.450–4.500)

## 2016-02-03 ENCOUNTER — Other Ambulatory Visit: Payer: Self-pay | Admitting: Family Medicine

## 2016-02-03 DIAGNOSIS — N401 Enlarged prostate with lower urinary tract symptoms: Principal | ICD-10-CM

## 2016-02-03 DIAGNOSIS — N138 Other obstructive and reflux uropathy: Secondary | ICD-10-CM

## 2016-02-05 ENCOUNTER — Other Ambulatory Visit: Payer: Self-pay | Admitting: Family Medicine

## 2016-02-05 DIAGNOSIS — I10 Essential (primary) hypertension: Secondary | ICD-10-CM

## 2016-02-09 ENCOUNTER — Other Ambulatory Visit: Payer: Self-pay | Admitting: Family Medicine

## 2016-02-09 DIAGNOSIS — L259 Unspecified contact dermatitis, unspecified cause: Secondary | ICD-10-CM

## 2016-02-09 NOTE — Telephone Encounter (Signed)
Routing to provider. Next appt 05/30/16.

## 2016-02-18 ENCOUNTER — Other Ambulatory Visit: Payer: Self-pay | Admitting: Family Medicine

## 2016-02-18 DIAGNOSIS — I10 Essential (primary) hypertension: Secondary | ICD-10-CM

## 2016-02-18 NOTE — Telephone Encounter (Signed)
La11st OV: 11/30/15 Next OV: 05/30/16  BMP Latest Ref Rng & Units 11/30/2015 05/24/2015 11/24/2014  Glucose 65 - 99 mg/dL 64(L) 71 80  BUN 8 - 27 mg/dL 18 22 15   Creatinine 0.76 - 1.27 mg/dL 1.10 1.19 1.19  BUN/Creat Ratio 10 - 24 16 18 13   Sodium 134 - 144 mmol/L 145(H) 140 142  Potassium 3.5 - 5.2 mmol/L 4.1 4.5 4.8  Chloride 96 - 106 mmol/L 103 102 102  CO2 18 - 29 mmol/L 25 24 26   Calcium 8.6 - 10.2 mg/dL 9.2 9.2 9.3

## 2016-05-30 ENCOUNTER — Ambulatory Visit (INDEPENDENT_AMBULATORY_CARE_PROVIDER_SITE_OTHER): Payer: BLUE CROSS/BLUE SHIELD | Admitting: Family Medicine

## 2016-05-30 ENCOUNTER — Encounter: Payer: Self-pay | Admitting: Family Medicine

## 2016-05-30 VITALS — BP 107/71 | HR 68 | Ht 68.0 in | Wt 233.0 lb

## 2016-05-30 DIAGNOSIS — N138 Other obstructive and reflux uropathy: Secondary | ICD-10-CM

## 2016-05-30 DIAGNOSIS — I10 Essential (primary) hypertension: Secondary | ICD-10-CM

## 2016-05-30 DIAGNOSIS — N401 Enlarged prostate with lower urinary tract symptoms: Secondary | ICD-10-CM

## 2016-05-30 DIAGNOSIS — L259 Unspecified contact dermatitis, unspecified cause: Secondary | ICD-10-CM

## 2016-05-30 MED ORDER — TAMSULOSIN HCL 0.4 MG PO CAPS: 0.4000 mg | ORAL_CAPSULE | Freq: Every day | ORAL | 2 refills | Status: DC

## 2016-05-30 MED ORDER — TRIAMCINOLONE ACETONIDE 0.1 % EX CREA: 1.0000 "application " | TOPICAL_CREAM | Freq: Two times a day (BID) | CUTANEOUS | 0 refills | Status: DC

## 2016-05-30 NOTE — Assessment & Plan Note (Signed)
Still using triamcinolone when necessary on various spots he gets especially on his legs these itch and then resolve with intermittent use of triamcinolone.

## 2016-05-30 NOTE — Assessment & Plan Note (Signed)
The current medical regimen is effective;  continue present plan and medications.  

## 2016-05-30 NOTE — Progress Notes (Signed)
BP 107/71   Pulse 68   Ht 5\' 8"  (1.727 m)   Wt 233 lb (105.7 kg)   SpO2 98%   BMI 35.43 kg/m    Subjective:    Patient ID: Logan Willis, male    DOB: 01/12/1945, 71 y.o.   MRN: [id number]  HPI: Logan Willis is a 71 y.o. male  Chief Complaint  Patient presents with  . Follow-up  . Hypertension  Patient doing well with Benzapril no complaints takes medications faithfully without problems and good blood pressure control no low blood pressure high blood pressure excursions. Uses triamcinolone when necessary especially on leg lesions and does good control. Taking Allegra for congestion and allergies which helps Relevant past medical, surgical, family and social history reviewed and updated as indicated. Interim medical history since our last visit reviewed. Allergies and medications reviewed and updated.  Review of Systems  Constitutional: Negative.   Respiratory: Negative.   Cardiovascular: Negative.     Per HPI unless specifically indicated above     Objective:    BP 107/71   Pulse 68   Ht 5\' 8"  (1.727 m)   Wt 233 lb (105.7 kg)   SpO2 98%   BMI 35.43 kg/m   Wt Readings from Last 3 Encounters:  05/30/16 233 lb (105.7 kg)  11/30/15 229 lb (103.9 kg)  05/24/15 228 lb (103.4 kg)    Physical Exam  Constitutional: He is oriented to person, place, and time. He appears well-developed and well-nourished.  HENT:  Head: Normocephalic and atraumatic.  Eyes: Conjunctivae and EOM are normal.  Neck: Normal range of motion.  Cardiovascular: Normal rate, regular rhythm and normal heart sounds.   Pulmonary/Chest: Effort normal and breath sounds normal.  Musculoskeletal: Normal range of motion.  Neurological: He is alert and oriented to person, place, and time.  Skin: No erythema.  Psychiatric: He has a normal mood and affect. His behavior is normal. Judgment and thought content normal.    Results for orders placed or performed in visit on 11/30/15  CBC with  Differential/Platelet  Result Value Ref Range   WBC 4.5 3.4 - 10.8 x10E3/uL   RBC 5.17 4.14 - 5.80 x10E6/uL   Hemoglobin 14.9 12.6 - 17.7 g/dL   Hematocrit 44.0 37.5 - 51.0 %   MCV 85 79 - 97 fL   MCH 28.8 26.6 - 33.0 pg   MCHC 33.9 31.5 - 35.7 g/dL   RDW 13.8 12.3 - 15.4 %   Platelets 166 150 - 379 x10E3/uL   Neutrophils 61 Not Estab. %   Lymphs 26 Not Estab. %   Monocytes 11 Not Estab. %   Eos 2 Not Estab. %   Basos 0 Not Estab. %   Neutrophils Absolute 2.7 1.4 - 7.0 x10E3/uL   Lymphocytes Absolute 1.2 0.7 - 3.1 x10E3/uL   Monocytes Absolute 0.5 0.1 - 0.9 x10E3/uL   EOS (ABSOLUTE) 0.1 0.0 - 0.4 x10E3/uL   Basophils Absolute 0.0 0.0 - 0.2 x10E3/uL   Immature Granulocytes 0 Not Estab. %   Immature Grans (Abs) 0.0 0.0 - 0.1 x10E3/uL  Comprehensive metabolic panel  Result Value Ref Range   Glucose 64 (L) 65 - 99 mg/dL   BUN 18 8 - 27 mg/dL   Creatinine, Ser 1.10 0.76 - 1.27 mg/dL   GFR calc non Af Amer 69 >59 mL/min/1.73   GFR calc Af Amer 79 >59 mL/min/1.73   BUN/Creatinine Ratio 16 10 - 24   Sodium 145 (H) 134 - 144  mmol/L   Potassium 4.1 3.5 - 5.2 mmol/L   Chloride 103 96 - 106 mmol/L   CO2 25 18 - 29 mmol/L   Calcium 9.2 8.6 - 10.2 mg/dL   Total Protein 6.5 6.0 - 8.5 g/dL   Albumin 4.6 3.6 - 4.8 g/dL   Globulin, Total 1.9 1.5 - 4.5 g/dL   Albumin/Globulin Ratio 2.4 (H) 1.2 - 2.2   Bilirubin Total 0.5 0.0 - 1.2 mg/dL   Alkaline Phosphatase 58 39 - 117 IU/L   AST 17 0 - 40 IU/L   ALT 23 0 - 44 IU/L  Lipid Profile  Result Value Ref Range   Cholesterol, Total 157 100 - 199 mg/dL   Triglycerides 115 0 - 149 mg/dL   HDL 40 >39 mg/dL   VLDL Cholesterol Cal 23 5 - 40 mg/dL   LDL Calculated 94 0 - 99 mg/dL   Chol/HDL Ratio 3.9 0.0 - 5.0 ratio units  Urinalysis, Routine w reflex microscopic (not at [facility])  Result Value Ref Range   Specific Gravity, UA 1.010 1.005 - 1.030   pH, UA 6.5 5.0 - 7.5   Color, UA Yellow Yellow   Appearance Ur Clear Clear   Leukocytes, UA  Negative Negative   Protein, UA Negative Negative/Trace   Glucose, UA Negative Negative   Ketones, UA Negative Negative   RBC, UA Negative Negative   Bilirubin, UA Negative Negative   Urobilinogen, Ur 0.2 0.2 - 1.0 mg/dL   Nitrite, UA Negative Negative  PSA  Result Value Ref Range   Prostate Specific Ag, Serum 1.9 0.0 - 4.0 ng/mL  TSH  Result Value Ref Range   TSH 1.650 0.450 - 4.500 uIU/mL      Assessment & Plan:   Problem List Items Addressed This Visit      Cardiovascular and Mediastinum   Essential hypertension - Primary    The current medical regimen is effective;  continue present plan and medications.       Relevant Orders   Basic metabolic panel     Musculoskeletal and Integument   Contact dermatitis    Still using triamcinolone when necessary on various spots he gets especially on his legs these itch and then resolve with intermittent use of triamcinolone.      Relevant Medications   triamcinolone cream (KENALOG) 0.1 %     Genitourinary   Benign prostatic hyperplasia with urinary obstruction   Relevant Medications   tamsulosin (FLOMAX) 0.4 MG CAPS capsule     discuss weight loss diet exercise nutrition  Follow up plan: Return in about 6 months (around 11/30/2016) for Physical Exam.

## 2016-05-31 ENCOUNTER — Encounter: Payer: Self-pay | Admitting: Family Medicine

## 2016-05-31 LAB — BASIC METABOLIC PANEL
BUN/Creatinine Ratio: 15 (ref 10–24)
BUN: 19 mg/dL (ref 8–27)
CO2: 25 mmol/L (ref 18–29)
Calcium: 9.2 mg/dL (ref 8.6–10.2)
Chloride: 104 mmol/L (ref 96–106)
Creatinine, Ser: 1.24 mg/dL (ref 0.76–1.27)
GFR calc Af Amer: 69 mL/min/{1.73_m2} (ref >59)
GFR calc non Af Amer: 59 mL/min/{1.73_m2} — ABNORMAL LOW (ref >59)
Glucose: 105 mg/dL — ABNORMAL HIGH (ref 65–99)
Potassium: 4.8 mmol/L (ref 3.5–5.2)
Sodium: 140 mmol/L (ref 134–144)

## 2016-09-26 ENCOUNTER — Ambulatory Visit (INDEPENDENT_AMBULATORY_CARE_PROVIDER_SITE_OTHER): Payer: BLUE CROSS/BLUE SHIELD

## 2016-09-26 DIAGNOSIS — Z23 Encounter for immunization: Secondary | ICD-10-CM

## 2016-12-19 ENCOUNTER — Encounter: Payer: BLUE CROSS/BLUE SHIELD | Admitting: Family Medicine

## 2017-01-10 ENCOUNTER — Encounter: Payer: BLUE CROSS/BLUE SHIELD | Admitting: Family Medicine

## 2017-01-29 ENCOUNTER — Encounter: Payer: Self-pay | Admitting: Family Medicine

## 2017-01-29 ENCOUNTER — Ambulatory Visit (INDEPENDENT_AMBULATORY_CARE_PROVIDER_SITE_OTHER): Payer: BLUE CROSS/BLUE SHIELD | Admitting: Family Medicine

## 2017-01-29 VITALS — BP 116/76 | HR 62 | Ht 68.0 in | Wt 237.0 lb

## 2017-01-29 DIAGNOSIS — N401 Enlarged prostate with lower urinary tract symptoms: Secondary | ICD-10-CM

## 2017-01-29 DIAGNOSIS — Z7189 Other specified counseling: Secondary | ICD-10-CM | POA: Diagnosis not present

## 2017-01-29 DIAGNOSIS — Z0001 Encounter for general adult medical examination with abnormal findings: Secondary | ICD-10-CM

## 2017-01-29 DIAGNOSIS — I10 Essential (primary) hypertension: Secondary | ICD-10-CM | POA: Diagnosis not present

## 2017-01-29 DIAGNOSIS — B001 Herpesviral vesicular dermatitis: Secondary | ICD-10-CM | POA: Diagnosis not present

## 2017-01-29 DIAGNOSIS — L259 Unspecified contact dermatitis, unspecified cause: Secondary | ICD-10-CM | POA: Diagnosis not present

## 2017-01-29 DIAGNOSIS — N138 Other obstructive and reflux uropathy: Secondary | ICD-10-CM

## 2017-01-29 DIAGNOSIS — Z0000 Encounter for general adult medical examination without abnormal findings: Secondary | ICD-10-CM

## 2017-01-29 LAB — URINALYSIS, ROUTINE W REFLEX MICROSCOPIC
Bilirubin, UA: NEGATIVE
Glucose, UA: NEGATIVE
Ketones, UA: NEGATIVE
Leukocytes, UA: NEGATIVE
Nitrite, UA: NEGATIVE
Protein, UA: NEGATIVE
RBC, UA: NEGATIVE
Specific Gravity, UA: 1.01 (ref 1.005–1.030)
Urobilinogen, Ur: 0.2 mg/dL (ref 0.2–1.0)
pH, UA: 6 (ref 5.0–7.5)

## 2017-01-29 MED ORDER — TRIAMCINOLONE ACETONIDE 0.1 % EX CREA: 1.0000 "application " | TOPICAL_CREAM | Freq: Two times a day (BID) | CUTANEOUS | 0 refills | Status: DC

## 2017-01-29 MED ORDER — TAMSULOSIN HCL 0.4 MG PO CAPS: 0.4000 mg | ORAL_CAPSULE | Freq: Every day | ORAL | 4 refills | Status: DC

## 2017-01-29 MED ORDER — VALACYCLOVIR HCL 500 MG PO TABS: 1000.0000 mg | ORAL_TABLET | Freq: Two times a day (BID) | ORAL | 12 refills | Status: DC

## 2017-01-29 MED ORDER — BENAZEPRIL HCL 40 MG PO TABS: 40.0000 mg | ORAL_TABLET | Freq: Every day | ORAL | 4 refills | Status: DC

## 2017-01-29 NOTE — Assessment & Plan Note (Signed)
A voluntary discussion about advance care planning including the explanation and discussion of advance directives was extensively discussed  with the patient.  Explanation about the health care proxy and Living will was reviewed and packet with forms with explanation of how to fill them out was given.    

## 2017-01-29 NOTE — Assessment & Plan Note (Signed)
The current medical regimen is effective;  continue present plan and medications.  

## 2017-01-29 NOTE — Progress Notes (Signed)
BP 116/76   Pulse 62   Ht 5\' 8"  (2.458 m)   Wt 237 lb (107.5 kg)   SpO2 99%   BMI 36.04 kg/m    Subjective:    Patient ID: Logan Willis, male    DOB: 13-Mar-1945, 72 y.o.   MRN: [id number]  HPI: JUN OSMENT is a 72 y.o. male  Annual exam Doing well with ambulation and blood pressure doing well with benazepril no complaints takes occasional Valtrex for occasional cold sore and triamcinolone for dermatitis.  Does well with no complaints or side effects  Relevant past medical, surgical, family and social history reviewed and updated as indicated. Interim medical history since our last visit reviewed. Allergies and medications reviewed and updated.  Review of Systems  Constitutional: Negative.   HENT: Negative.   Eyes: Negative.   Respiratory: Negative.   Cardiovascular: Negative.   Gastrointestinal: Negative.   Endocrine: Negative.   Genitourinary: Negative.   Musculoskeletal: Negative.   Skin: Negative.   Allergic/Immunologic: Negative.   Neurological: Negative.   Hematological: Negative.   Psychiatric/Behavioral: Negative.     Per HPI unless specifically indicated above     Objective:    BP 116/76   Pulse 62   Ht 5\' 8"  (1.727 m)   Wt 237 lb (107.5 kg)   SpO2 99%   BMI 36.04 kg/m   Wt Readings from Last 3 Encounters:  01/29/17 237 lb (107.5 kg)  05/30/16 233 lb (105.7 kg)  11/30/15 229 lb (103.9 kg)    Physical Exam  Constitutional: He is oriented to person, place, and time. He appears well-developed and well-nourished.  HENT:  Head: Normocephalic and atraumatic.  Right Ear: External ear normal.  Left Ear: External ear normal.  Eyes: Conjunctivae and EOM are normal. Pupils are equal, round, and reactive to light.  Neck: Normal range of motion. Neck supple.  Cardiovascular: Normal rate, regular rhythm, normal heart sounds and intact distal pulses.  Pulmonary/Chest: Effort normal and breath sounds normal.  Abdominal: Soft. Bowel sounds are normal.  There is no splenomegaly or hepatomegaly.  Genitourinary: Rectum normal and penis normal.  Genitourinary Comments: BPH chjanges  Musculoskeletal: Normal range of motion.  Neurological: He is alert and oriented to person, place, and time. He has normal reflexes.  Skin: No rash noted. No erythema.  Psychiatric: He has a normal mood and affect. His behavior is normal. Judgment and thought content normal.    Results for orders placed or performed in visit on 05/39/76  Basic metabolic panel  Result Value Ref Range   Glucose 105 (H) 65 - 99 mg/dL   BUN 19 8 - 27 mg/dL   Creatinine, Ser 1.24 0.76 - 1.27 mg/dL   GFR calc non Af Amer 59 (L) >59 mL/min/1.73   GFR calc Af Amer 69 >59 mL/min/1.73   BUN/Creatinine Ratio 15 10 - 24   Sodium 140 134 - 144 mmol/L   Potassium 4.8 3.5 - 5.2 mmol/L   Chloride 104 96 - 106 mmol/L   CO2 25 18 - 29 mmol/L   Calcium 9.2 8.6 - 10.2 mg/dL      Assessment & Plan:   Problem List Items Addressed This Visit      Cardiovascular and Mediastinum   Essential hypertension - Primary    The current medical regimen is effective;  continue present plan and medications.       Relevant Medications   benazepril (LOTENSIN) 40 MG tablet   Other Relevant Orders   CBC  with Differential/Platelet   Comprehensive metabolic panel   Lipid panel   TSH   Urinalysis, Routine w reflex microscopic     Digestive   Cold sore    The current medical regimen is effective;  continue present plan and medications.       Relevant Medications   valACYclovir (VALTREX) 500 MG tablet     Musculoskeletal and Integument   Contact dermatitis    The current medical regimen is effective;  continue present plan and medications.       Relevant Medications   triamcinolone cream (KENALOG) 0.1 %     Genitourinary   Benign prostatic hyperplasia with urinary obstruction    The current medical regimen is effective;  continue present plan and medications.       Relevant  Medications   tamsulosin (FLOMAX) 0.4 MG CAPS capsule   Other Relevant Orders   Comprehensive metabolic panel   Lipid panel   PSA   TSH     Other   Advanced care planning/counseling discussion    A voluntary discussion about advance care planning including the explanation and discussion of advance directives was extensively discussed  with the patient.  Explanation about the health care proxy and Living will was reviewed and packet with forms with explanation of how to fill them out was given.          Other Visit Diagnoses    PE (physical exam), annual           Follow up plan: Return in about 6 months (around 07/29/2017), or if symptoms worsen or fail to improve, for BMP.

## 2017-01-30 LAB — CBC WITH DIFFERENTIAL/PLATELET
Basophils Absolute: 0 10*3/uL (ref 0.0–0.2)
Basos: 0 %
EOS (ABSOLUTE): 0.1 10*3/uL (ref 0.0–0.4)
Eos: 2 %
Hematocrit: 42.8 % (ref 37.5–51.0)
Hemoglobin: 14.7 g/dL (ref 13.0–17.7)
Immature Grans (Abs): 0 10*3/uL (ref 0.0–0.1)
Immature Granulocytes: 0 %
Lymphocytes Absolute: 1.3 10*3/uL (ref 0.7–3.1)
Lymphs: 24 %
MCH: 29.4 pg (ref 26.6–33.0)
MCHC: 34.3 g/dL (ref 31.5–35.7)
MCV: 86 fL (ref 79–97)
Monocytes Absolute: 0.5 10*3/uL (ref 0.1–0.9)
Monocytes: 8 %
Neutrophils Absolute: 3.7 10*3/uL (ref 1.4–7.0)
Neutrophils: 66 %
Platelets: 158 10*3/uL (ref 150–379)
RBC: 5 x10E6/uL (ref 4.14–5.80)
RDW: 14.5 % (ref 12.3–15.4)
WBC: 5.6 10*3/uL (ref 3.4–10.8)

## 2017-01-30 LAB — COMPREHENSIVE METABOLIC PANEL
ALT: 22 IU/L (ref 0–44)
AST: 16 IU/L (ref 0–40)
Albumin/Globulin Ratio: 2.6 — ABNORMAL HIGH (ref 1.2–2.2)
Albumin: 4.5 g/dL (ref 3.6–4.8)
Alkaline Phosphatase: 54 IU/L (ref 39–117)
BUN/Creatinine Ratio: 14 (ref 10–24)
BUN: 20 mg/dL (ref 8–27)
Bilirubin Total: 0.2 mg/dL (ref 0.0–1.2)
CO2: 23 mmol/L (ref 20–29)
Calcium: 9.2 mg/dL (ref 8.6–10.2)
Chloride: 102 mmol/L (ref 96–106)
Creatinine, Ser: 1.41 mg/dL — ABNORMAL HIGH (ref 0.76–1.27)
GFR calc Af Amer: 58 mL/min/{1.73_m2} — ABNORMAL LOW (ref >59)
GFR calc non Af Amer: 50 mL/min/{1.73_m2} — ABNORMAL LOW (ref >59)
Globulin, Total: 1.7 g/dL (ref 1.5–4.5)
Glucose: 89 mg/dL (ref 65–99)
Potassium: 4.6 mmol/L (ref 3.5–5.2)
Sodium: 141 mmol/L (ref 134–144)
Total Protein: 6.2 g/dL (ref 6.0–8.5)

## 2017-01-30 LAB — TSH: TSH: 1.69 u[IU]/mL (ref 0.450–4.500)

## 2017-01-30 LAB — PSA: Prostate Specific Ag, Serum: 2.1 ng/mL (ref 0.0–4.0)

## 2017-01-30 LAB — LIPID PANEL
Chol/HDL Ratio: 3.5 ratio (ref 0.0–5.0)
Cholesterol, Total: 143 mg/dL (ref 100–199)
HDL: 41 mg/dL (ref >39)
LDL Calculated: 81 mg/dL (ref 0–99)
Triglycerides: 103 mg/dL (ref 0–149)
VLDL Cholesterol Cal: 21 mg/dL (ref 5–40)

## 2017-01-31 ENCOUNTER — Telehealth: Payer: Self-pay | Admitting: Family Medicine

## 2017-01-31 DIAGNOSIS — N183 Chronic kidney disease, stage 3 unspecified: Secondary | ICD-10-CM

## 2017-01-31 NOTE — Telephone Encounter (Signed)
Phone call Discussed with patient finding renal function patient taking Advil and aspirin every day.  Reviewed not to take these medications at all will discontinue will recheck BMP 1 month.

## 2017-03-23 ENCOUNTER — Other Ambulatory Visit: Payer: BLUE CROSS/BLUE SHIELD

## 2017-03-23 DIAGNOSIS — N183 Chronic kidney disease, stage 3 unspecified: Secondary | ICD-10-CM

## 2017-03-23 LAB — URINALYSIS, ROUTINE W REFLEX MICROSCOPIC
Bilirubin, UA: NEGATIVE
Glucose, UA: NEGATIVE
Ketones, UA: NEGATIVE
Leukocytes, UA: NEGATIVE
Nitrite, UA: NEGATIVE
Protein, UA: NEGATIVE
RBC, UA: NEGATIVE
Specific Gravity, UA: 1.015 (ref 1.005–1.030)
Urobilinogen, Ur: 0.2 mg/dL (ref 0.2–1.0)
pH, UA: 7 (ref 5.0–7.5)

## 2017-03-23 NOTE — Progress Notes (Signed)
Patient requested urine with bmp at todays visit. Verbal order received from [place].

## 2017-03-24 LAB — BASIC METABOLIC PANEL
BUN/Creatinine Ratio: 15 (ref 10–24)
BUN: 17 mg/dL (ref 8–27)
CO2: 17 mmol/L — ABNORMAL LOW (ref 20–29)
Calcium: 9.2 mg/dL (ref 8.6–10.2)
Chloride: 107 mmol/L — ABNORMAL HIGH (ref 96–106)
Creatinine, Ser: 1.17 mg/dL (ref 0.76–1.27)
GFR calc Af Amer: 73 mL/min/{1.73_m2} (ref >59)
GFR calc non Af Amer: 63 mL/min/{1.73_m2} (ref >59)
Glucose: 79 mg/dL (ref 65–99)
Potassium: 4.8 mmol/L (ref 3.5–5.2)
Sodium: 145 mmol/L — ABNORMAL HIGH (ref 134–144)

## 2017-03-26 ENCOUNTER — Encounter: Payer: Self-pay | Admitting: Family Medicine

## 2017-07-31 ENCOUNTER — Ambulatory Visit (INDEPENDENT_AMBULATORY_CARE_PROVIDER_SITE_OTHER): Payer: BLUE CROSS/BLUE SHIELD | Admitting: Family Medicine

## 2017-07-31 ENCOUNTER — Encounter: Payer: Self-pay | Admitting: Family Medicine

## 2017-07-31 VITALS — BP 96/62 | HR 71 | Ht 68.0 in | Wt 234.0 lb

## 2017-07-31 DIAGNOSIS — N401 Enlarged prostate with lower urinary tract symptoms: Secondary | ICD-10-CM | POA: Diagnosis not present

## 2017-07-31 DIAGNOSIS — N138 Other obstructive and reflux uropathy: Secondary | ICD-10-CM | POA: Diagnosis not present

## 2017-07-31 DIAGNOSIS — I10 Essential (primary) hypertension: Secondary | ICD-10-CM | POA: Diagnosis not present

## 2017-07-31 DIAGNOSIS — Z23 Encounter for immunization: Secondary | ICD-10-CM | POA: Diagnosis not present

## 2017-07-31 MED ORDER — BENAZEPRIL HCL 20 MG PO TABS: 20.0000 mg | ORAL_TABLET | Freq: Every day | ORAL | 2 refills | Status: DC

## 2017-07-31 NOTE — Patient Instructions (Signed)

## 2017-07-31 NOTE — Assessment & Plan Note (Signed)
The current medical regimen is effective;  continue present plan and medications.  

## 2017-07-31 NOTE — Assessment & Plan Note (Signed)
Discuss hypertension good control with low blood pressure readings will decrease benazepril from 40 mg to 20 mg.

## 2017-07-31 NOTE — Progress Notes (Signed)
BP 96/62   Pulse 71   Ht 5\' 8"  (1.727 m)   Wt 234 lb (106.1 kg)   SpO2 98%   BMI 35.58 kg/m    Subjective:    Patient ID: Logan Willis, male    DOB: 07/21/1945, 72 y.o.   MRN: [id number]  HPI: Logan Willis is a 72 y.o. male  Chief Complaint  Patient presents with  . Follow-up  . Hypertension  Patient all in all doing well no complaints taking Benzapril 40 mg a day without problems blood pressures low here and on previous readings no lightheaded dizzy spells or other low blood pressure type symptoms. Has stopped all nonsteroidal medications is taking Valtrex on a as needed basis and doing well tamsulosin for BPH stable and doing well. Patient also with slight decline of renal function which improved with stopping aspirin Advil Aleve. Last BMP showed return to normal function. Patient had been given an antifungal agent for chronic tinea pedis.  Did not take because of concerns discussed okay to take with kidneys may wait until this fall.  Relevant past medical, surgical, family and social history reviewed and updated as indicated. Interim medical history since our last visit reviewed. Allergies and medications reviewed and updated.  Review of Systems  Constitutional: Negative.   Respiratory: Negative.   Cardiovascular: Negative.     Per HPI unless specifically indicated above     Objective:    BP 96/62   Pulse 71   Ht 5\' 8"  (1.727 m)   Wt 234 lb (106.1 kg)   SpO2 98%   BMI 35.58 kg/m   Wt Readings from Last 3 Encounters:  07/31/17 234 lb (106.1 kg)  01/29/17 237 lb (107.5 kg)  05/30/16 233 lb (105.7 kg)    Physical Exam  Constitutional: He is oriented to person, place, and time. He appears well-developed and well-nourished.  HENT:  Head: Normocephalic and atraumatic.  Eyes: Conjunctivae and EOM are normal.  Neck: Normal range of motion.  Cardiovascular: Normal rate, regular rhythm and normal heart sounds.  Pulmonary/Chest: Effort normal and breath  sounds normal.  Musculoskeletal: Normal range of motion.  Neurological: He is alert and oriented to person, place, and time.  Skin: No erythema.  Psychiatric: He has a normal mood and affect. His behavior is normal. Judgment and thought content normal.    Results for orders placed or performed in visit on 19/37/90  Basic metabolic panel  Result Value Ref Range   Glucose 79 65 - 99 mg/dL   BUN 17 8 - 27 mg/dL   Creatinine, Ser 1.17 0.76 - 1.27 mg/dL   GFR calc non Af Amer 63 >59 mL/min/1.73   GFR calc Af Amer 73 >59 mL/min/1.73   BUN/Creatinine Ratio 15 10 - 24   Sodium 145 (H) 134 - 144 mmol/L   Potassium 4.8 3.5 - 5.2 mmol/L   Chloride 107 (H) 96 - 106 mmol/L   CO2 17 (L) 20 - 29 mmol/L   Calcium 9.2 8.6 - 10.2 mg/dL  Urinalysis, Routine w reflex microscopic  Result Value Ref Range   Specific Gravity, UA 1.015 1.005 - 1.030   pH, UA 7.0 5.0 - 7.5   Color, UA Yellow Yellow   Appearance Ur Clear Clear   Leukocytes, UA Negative Negative   Protein, UA Negative Negative/Trace   Glucose, UA Negative Negative   Ketones, UA Negative Negative   RBC, UA Negative Negative   Bilirubin, UA Negative Negative   Urobilinogen, Ur 0.2  0.2 - 1.0 mg/dL   Nitrite, UA Negative Negative      Assessment & Plan:   Problem List Items Addressed This Visit      Cardiovascular and Mediastinum   Essential hypertension - Primary    Discuss hypertension good control with low blood pressure readings will decrease benazepril from 40 mg to 20 mg.      Relevant Medications   benazepril (LOTENSIN) 20 MG tablet   Other Relevant Orders   Basic metabolic panel     Genitourinary   Benign prostatic hyperplasia with urinary obstruction    The current medical regimen is effective;  continue present plan and medications.        Other Visit Diagnoses    Need for 23-polyvalent pneumococcal polysaccharide vaccine       Relevant Orders   Pneumococcal polysaccharide vaccine 23-valent greater than or  equal to 2yo subcutaneous/IM (Completed)    Reviewed CKD with resolution and rechecking today.  Follow up plan: Return in about 6 months (around 01/31/2018) for Physical Exam.

## 2017-08-01 ENCOUNTER — Encounter: Payer: Self-pay | Admitting: Family Medicine

## 2017-08-01 LAB — BASIC METABOLIC PANEL
BUN/Creatinine Ratio: 15 (ref 10–24)
BUN: 19 mg/dL (ref 8–27)
CO2: 22 mmol/L (ref 20–29)
Calcium: 9.2 mg/dL (ref 8.6–10.2)
Chloride: 107 mmol/L — ABNORMAL HIGH (ref 96–106)
Creatinine, Ser: 1.28 mg/dL — ABNORMAL HIGH (ref 0.76–1.27)
GFR calc Af Amer: 65 mL/min/{1.73_m2} (ref >59)
GFR calc non Af Amer: 56 mL/min/{1.73_m2} — ABNORMAL LOW (ref >59)
Glucose: 81 mg/dL (ref 65–99)
Potassium: 4.4 mmol/L (ref 3.5–5.2)
Sodium: 142 mmol/L (ref 134–144)

## 2017-10-02 ENCOUNTER — Ambulatory Visit (INDEPENDENT_AMBULATORY_CARE_PROVIDER_SITE_OTHER): Payer: BLUE CROSS/BLUE SHIELD

## 2017-10-02 DIAGNOSIS — Z23 Encounter for immunization: Secondary | ICD-10-CM | POA: Diagnosis not present

## 2017-10-02 NOTE — Patient Instructions (Signed)

## 2018-02-05 ENCOUNTER — Encounter: Payer: Self-pay | Admitting: Family Medicine

## 2018-02-05 ENCOUNTER — Ambulatory Visit (INDEPENDENT_AMBULATORY_CARE_PROVIDER_SITE_OTHER): Payer: 59 | Admitting: Family Medicine

## 2018-02-05 VITALS — BP 113/68 | HR 70 | Temp 98.2°F | Ht 67.8 in | Wt 236.0 lb

## 2018-02-05 DIAGNOSIS — I10 Essential (primary) hypertension: Secondary | ICD-10-CM

## 2018-02-05 DIAGNOSIS — L259 Unspecified contact dermatitis, unspecified cause: Secondary | ICD-10-CM | POA: Diagnosis not present

## 2018-02-05 DIAGNOSIS — N138 Other obstructive and reflux uropathy: Secondary | ICD-10-CM

## 2018-02-05 DIAGNOSIS — Z0000 Encounter for general adult medical examination without abnormal findings: Secondary | ICD-10-CM

## 2018-02-05 DIAGNOSIS — N401 Enlarged prostate with lower urinary tract symptoms: Secondary | ICD-10-CM

## 2018-02-05 DIAGNOSIS — B001 Herpesviral vesicular dermatitis: Secondary | ICD-10-CM | POA: Diagnosis not present

## 2018-02-05 DIAGNOSIS — Z7189 Other specified counseling: Secondary | ICD-10-CM

## 2018-02-05 LAB — URINALYSIS, ROUTINE W REFLEX MICROSCOPIC
Bilirubin, UA: NEGATIVE
Glucose, UA: NEGATIVE
Ketones, UA: NEGATIVE
Leukocytes, UA: NEGATIVE
Nitrite, UA: NEGATIVE
Protein, UA: NEGATIVE
RBC, UA: NEGATIVE
Specific Gravity, UA: 1.02 (ref 1.005–1.030)
Urobilinogen, Ur: 0.2 mg/dL (ref 0.2–1.0)
pH, UA: 5.5 (ref 5.0–7.5)

## 2018-02-05 MED ORDER — VALACYCLOVIR HCL 500 MG PO TABS: 1000.0000 mg | ORAL_TABLET | Freq: Two times a day (BID) | ORAL | 12 refills | Status: DC

## 2018-02-05 MED ORDER — BENAZEPRIL HCL 20 MG PO TABS: 20.0000 mg | ORAL_TABLET | Freq: Every day | ORAL | 4 refills | Status: DC

## 2018-02-05 MED ORDER — TAMSULOSIN HCL 0.4 MG PO CAPS: 0.4000 mg | ORAL_CAPSULE | Freq: Every day | ORAL | 4 refills | Status: DC

## 2018-02-05 MED ORDER — TRIAMCINOLONE ACETONIDE 0.1 % EX CREA: 1.0000 "application " | TOPICAL_CREAM | Freq: Two times a day (BID) | CUTANEOUS | 0 refills | Status: DC

## 2018-02-05 NOTE — Assessment & Plan Note (Signed)
The current medical regimen is effective;  continue present plan and medications.  

## 2018-02-05 NOTE — Assessment & Plan Note (Signed)
A voluntary discussion about advanced care planning including explanation and discussion of advanced directives was extentively discussed with the patient.  Explained about the healthcare proxy and living will was reviewed and packet with forms with expiration of how to fill them out was given.  Time spent: Encounter 16+ min individuals present: Patient 

## 2018-02-05 NOTE — Progress Notes (Signed)
BP 113/68   Pulse 70   Temp 98.2 F (36.8 C) (Oral)   Ht 5' 7.8" (1.722 m)   Wt 236 lb (107 kg)   SpO2 98%   BMI 36.10 kg/m    Subjective:    Patient ID: Logan Willis, male    DOB: [birth year], 73 y.o.   MRN: [id number]  HPI: Logan Willis is a 73 y.o. male  Chief Complaint  Patient presents with  . Annual Exam  Patient all in all doing well no real complaints on review of medication blood pressure meds working well without problems. BPH with tamsulosin doing well. As needed rare use of triamcinolone cream and Valtrex. Use of Allegra or Allegra-D on a as needed basis for congestion and does okay. Unfortunately patient had not been able to lose weight this year.   Relevant past medical, surgical, family and social history reviewed and updated as indicated. Interim medical history since our last visit reviewed. Allergies and medications reviewed and updated.  Review of Systems  Constitutional: Negative.   HENT: Negative.   Eyes: Negative.   Respiratory: Negative.   Cardiovascular: Negative.   Gastrointestinal: Negative.   Endocrine: Negative.   Genitourinary: Negative.   Musculoskeletal: Negative.   Skin: Negative.   Allergic/Immunologic: Negative.   Neurological: Negative.   Hematological: Negative.   Psychiatric/Behavioral: Negative.     Per HPI unless specifically indicated above     Objective:    BP 113/68   Pulse 70   Temp 98.2 F (36.8 C) (Oral)   Ht 5' 7.8" (1.722 m)   Wt 236 lb (107 kg)   SpO2 98%   BMI 36.10 kg/m   Wt Readings from Last 3 Encounters:  02/05/18 236 lb (107 kg)  07/31/17 234 lb (106.1 kg)  01/29/17 237 lb (107.5 kg)    Physical Exam Constitutional:      Appearance: He is well-developed.  HENT:     Head: Normocephalic and atraumatic.     Right Ear: External ear normal.     Left Ear: External ear normal.  Eyes:     Conjunctiva/sclera: Conjunctivae normal.     Pupils: Pupils are equal, round, and reactive to light.    Neck:     Musculoskeletal: Normal range of motion and neck supple.  Cardiovascular:     Rate and Rhythm: Normal rate and regular rhythm.     Heart sounds: Normal heart sounds.  Pulmonary:     Effort: Pulmonary effort is normal.     Breath sounds: Normal breath sounds.  Abdominal:     General: Bowel sounds are normal.     Palpations: Abdomen is soft. There is no hepatomegaly or splenomegaly.  Genitourinary:    Penis: Normal.      Prostate: Normal.     Rectum: Normal.  Musculoskeletal: Normal range of motion.  Skin:    Findings: No erythema or rash.  Neurological:     Mental Status: He is alert and oriented to person, place, and time.     Deep Tendon Reflexes: Reflexes are normal and symmetric.  Psychiatric:        Behavior: Behavior normal.        Thought Content: Thought content normal.        Judgment: Judgment normal.     Results for orders placed or performed in visit on 43/32/95  Basic metabolic panel  Result Value Ref Range   Glucose 81 65 - 99 mg/dL   BUN 19 8 - 27  mg/dL   Creatinine, Ser 1.28 (H) 0.76 - 1.27 mg/dL   GFR calc non Af Amer 56 (L) >59 mL/min/1.73   GFR calc Af Amer 65 >59 mL/min/1.73   BUN/Creatinine Ratio 15 10 - 24   Sodium 142 134 - 144 mmol/L   Potassium 4.4 3.5 - 5.2 mmol/L   Chloride 107 (H) 96 - 106 mmol/L   CO2 22 20 - 29 mmol/L   Calcium 9.2 8.6 - 10.2 mg/dL      Assessment & Plan:   Problem List Items Addressed This Visit      Cardiovascular and Mediastinum   Essential hypertension    The current medical regimen is effective;  continue present plan and medications.         Digestive   Cold sore    The current medical regimen is effective;  continue present plan and medications.         Musculoskeletal and Integument   Contact dermatitis    The current medical regimen is effective;  continue present plan and medications.         Genitourinary   Benign prostatic hyperplasia with urinary obstruction    The current  medical regimen is effective;  continue present plan and medications.         Other   Advanced care planning/counseling discussion    A voluntary discussion about advanced care planning including explanation and discussion of advanced directives was extentively discussed with the patient.  Explained about the healthcare proxy and living will was reviewed and packet with forms with expiration of how to fill them out was given.  Time spent: Encounter 16+ min individuals present: Patient       Other Visit Diagnoses    Routine general medical examination at a health care facility    -  Primary   Relevant Orders   CBC with Differential/Platelet   Comprehensive metabolic panel   TSH   PSA   Lipid panel   Urinalysis, Routine w reflex microscopic       Follow up plan: Return in about 6 months (around 08/06/2018) for BMP.

## 2018-02-06 ENCOUNTER — Encounter: Payer: Self-pay | Admitting: Family Medicine

## 2018-02-06 LAB — COMPREHENSIVE METABOLIC PANEL
ALT: 23 IU/L (ref 0–44)
AST: 17 IU/L (ref 0–40)
Albumin/Globulin Ratio: 2.5 — ABNORMAL HIGH (ref 1.2–2.2)
Albumin: 4.2 g/dL (ref 3.8–4.8)
Alkaline Phosphatase: 51 IU/L (ref 39–117)
BUN/Creatinine Ratio: 15 (ref 10–24)
BUN: 18 mg/dL (ref 8–27)
Bilirubin Total: 0.5 mg/dL (ref 0.0–1.2)
CO2: 22 mmol/L (ref 20–29)
Calcium: 9.2 mg/dL (ref 8.6–10.2)
Chloride: 105 mmol/L (ref 96–106)
Creatinine, Ser: 1.17 mg/dL (ref 0.76–1.27)
GFR calc Af Amer: 73 mL/min/{1.73_m2} (ref >59)
GFR calc non Af Amer: 63 mL/min/{1.73_m2} (ref >59)
Globulin, Total: 1.7 g/dL (ref 1.5–4.5)
Glucose: 90 mg/dL (ref 65–99)
Potassium: 4.3 mmol/L (ref 3.5–5.2)
Sodium: 141 mmol/L (ref 134–144)
Total Protein: 5.9 g/dL — ABNORMAL LOW (ref 6.0–8.5)

## 2018-02-06 LAB — LIPID PANEL
Chol/HDL Ratio: 3.7 ratio (ref 0.0–5.0)
Cholesterol, Total: 154 mg/dL (ref 100–199)
HDL: 42 mg/dL (ref >39)
LDL Calculated: 98 mg/dL (ref 0–99)
Triglycerides: 69 mg/dL (ref 0–149)
VLDL Cholesterol Cal: 14 mg/dL (ref 5–40)

## 2018-02-06 LAB — CBC WITH DIFFERENTIAL/PLATELET
Basophils Absolute: 0 10*3/uL (ref 0.0–0.2)
Basos: 1 %
EOS (ABSOLUTE): 0.1 10*3/uL (ref 0.0–0.4)
Eos: 2 %
Hematocrit: 43.1 % (ref 37.5–51.0)
Hemoglobin: 15.2 g/dL (ref 13.0–17.7)
Immature Grans (Abs): 0 10*3/uL (ref 0.0–0.1)
Immature Granulocytes: 0 %
Lymphocytes Absolute: 0.9 10*3/uL (ref 0.7–3.1)
Lymphs: 26 %
MCH: 30.3 pg (ref 26.6–33.0)
MCHC: 35.3 g/dL (ref 31.5–35.7)
MCV: 86 fL (ref 79–97)
Monocytes Absolute: 0.2 10*3/uL (ref 0.1–0.9)
Monocytes: 6 %
Neutrophils Absolute: 2.4 10*3/uL (ref 1.4–7.0)
Neutrophils: 65 %
Platelets: 142 10*3/uL — ABNORMAL LOW (ref 150–450)
RBC: 5.02 x10E6/uL (ref 4.14–5.80)
RDW: 13.4 % (ref 11.6–15.4)
WBC: 3.6 10*3/uL (ref 3.4–10.8)

## 2018-02-06 LAB — TSH: TSH: 1.72 u[IU]/mL (ref 0.450–4.500)

## 2018-02-06 LAB — PSA: Prostate Specific Ag, Serum: 2.4 ng/mL (ref 0.0–4.0)

## 2018-08-06 ENCOUNTER — Other Ambulatory Visit: Payer: Self-pay

## 2018-08-06 ENCOUNTER — Encounter: Payer: Self-pay | Admitting: Family Medicine

## 2018-08-06 ENCOUNTER — Ambulatory Visit (INDEPENDENT_AMBULATORY_CARE_PROVIDER_SITE_OTHER): Payer: 59 | Admitting: Family Medicine

## 2018-08-06 DIAGNOSIS — I10 Essential (primary) hypertension: Secondary | ICD-10-CM | POA: Diagnosis not present

## 2018-08-06 DIAGNOSIS — L259 Unspecified contact dermatitis, unspecified cause: Secondary | ICD-10-CM | POA: Diagnosis not present

## 2018-08-06 DIAGNOSIS — N138 Other obstructive and reflux uropathy: Secondary | ICD-10-CM

## 2018-08-06 DIAGNOSIS — N401 Enlarged prostate with lower urinary tract symptoms: Secondary | ICD-10-CM | POA: Diagnosis not present

## 2018-08-06 DIAGNOSIS — B001 Herpesviral vesicular dermatitis: Secondary | ICD-10-CM | POA: Diagnosis not present

## 2018-08-06 MED ORDER — TRIAMCINOLONE ACETONIDE 0.1 % EX CREA: 1.0000 "application " | TOPICAL_CREAM | Freq: Two times a day (BID) | CUTANEOUS | 0 refills | Status: DC

## 2018-08-06 NOTE — Assessment & Plan Note (Signed)
The current medical regimen is effective;  continue present plan and medications.  

## 2018-08-06 NOTE — Assessment & Plan Note (Signed)
Stable with occasional use of triamcinolone

## 2018-08-06 NOTE — Progress Notes (Signed)
There were no vitals taken for this visit.   Subjective:    Patient ID: Logan Willis, male    DOB: [birth year], 73 y.o.   MRN: [id number]  HPI: Logan Willis is a 73 y.o. male  Med check Discussed with patient all in all doing well no blood pressure concerns or issues. Patient does have some intermittent stopped up the ears wants to have those checked has an appointment coming up in August to have that done.  Otherwise needs physical in January. Needs some more triamcinolone for occasional rash and itching which seems to work well.  Relevant past medical, surgical, family and social history reviewed and updated as indicated. Interim medical history since our last visit reviewed. Allergies and medications reviewed and updated.  Review of Systems  Constitutional: Negative.   Respiratory: Negative.   Cardiovascular: Negative.     Per HPI unless specifically indicated above     Objective:    There were no vitals taken for this visit.  Wt Readings from Last 3 Encounters:  02/05/18 236 lb (107 kg)  07/31/17 234 lb (106.1 kg)  01/29/17 237 lb (107.5 kg)    Physical Exam  Results for orders placed or performed in visit on 02/05/18  CBC with Differential/Platelet  Result Value Ref Range   WBC 3.6 3.4 - 10.8 x10E3/uL   RBC 5.02 4.14 - 5.80 x10E6/uL   Hemoglobin 15.2 13.0 - 17.7 g/dL   Hematocrit 43.1 37.5 - 51.0 %   MCV 86 79 - 97 fL   MCH 30.3 26.6 - 33.0 pg   MCHC 35.3 31.5 - 35.7 g/dL   RDW 13.4 11.6 - 15.4 %   Platelets 142 (L) 150 - 450 x10E3/uL   Neutrophils 65 Not Estab. %   Lymphs 26 Not Estab. %   Monocytes 6 Not Estab. %   Eos 2 Not Estab. %   Basos 1 Not Estab. %   Neutrophils Absolute 2.4 1.4 - 7.0 x10E3/uL   Lymphocytes Absolute 0.9 0.7 - 3.1 x10E3/uL   Monocytes Absolute 0.2 0.1 - 0.9 x10E3/uL   EOS (ABSOLUTE) 0.1 0.0 - 0.4 x10E3/uL   Basophils Absolute 0.0 0.0 - 0.2 x10E3/uL   Immature Granulocytes 0 Not Estab. %   Immature Grans (Abs) 0.0 0.0 -  0.1 x10E3/uL  Comprehensive metabolic panel  Result Value Ref Range   Glucose 90 65 - 99 mg/dL   BUN 18 8 - 27 mg/dL   Creatinine, Ser 1.17 0.76 - 1.27 mg/dL   GFR calc non Af Amer 63 >59 mL/min/1.73   GFR calc Af Amer 73 >59 mL/min/1.73   BUN/Creatinine Ratio 15 10 - 24   Sodium 141 134 - 144 mmol/L   Potassium 4.3 3.5 - 5.2 mmol/L   Chloride 105 96 - 106 mmol/L   CO2 22 20 - 29 mmol/L   Calcium 9.2 8.6 - 10.2 mg/dL   Total Protein 5.9 (L) 6.0 - 8.5 g/dL   Albumin 4.2 3.8 - 4.8 g/dL   Globulin, Total 1.7 1.5 - 4.5 g/dL   Albumin/Globulin Ratio 2.5 (H) 1.2 - 2.2   Bilirubin Total 0.5 0.0 - 1.2 mg/dL   Alkaline Phosphatase 51 39 - 117 IU/L   AST 17 0 - 40 IU/L   ALT 23 0 - 44 IU/L  TSH  Result Value Ref Range   TSH 1.720 0.450 - 4.500 uIU/mL  PSA  Result Value Ref Range   Prostate Specific Ag, Serum 2.4 0.0 - 4.0 ng/mL  Lipid panel  Result Value Ref Range   Cholesterol, Total 154 100 - 199 mg/dL   Triglycerides 69 0 - 149 mg/dL   HDL 42 >39 mg/dL   VLDL Cholesterol Cal 14 5 - 40 mg/dL   LDL Calculated 98 0 - 99 mg/dL   Chol/HDL Ratio 3.7 0.0 - 5.0 ratio  Urinalysis, Routine w reflex microscopic  Result Value Ref Range   Specific Gravity, UA 1.020 1.005 - 1.030   pH, UA 5.5 5.0 - 7.5   Color, UA Yellow Yellow   Appearance Ur Clear Clear   Leukocytes, UA Negative Negative   Protein, UA Negative Negative/Trace   Glucose, UA Negative Negative   Ketones, UA Negative Negative   RBC, UA Negative Negative   Bilirubin, UA Negative Negative   Urobilinogen, Ur 0.2 0.2 - 1.0 mg/dL   Nitrite, UA Negative Negative      Assessment & Plan:   Problem List Items Addressed This Visit      Cardiovascular and Mediastinum   Essential hypertension    The current medical regimen is effective;  continue present plan and medications.       Relevant Orders   Basic metabolic panel     Digestive   Cold sore    Uses as needed Valtrex with good effect        Musculoskeletal and  Integument   Contact dermatitis    Stable with occasional use of triamcinolone      Relevant Medications   triamcinolone cream (KENALOG) 0.1 %     Genitourinary   Benign prostatic hyperplasia with urinary obstruction    The current medical regimen is effective;  continue present plan and medications.          Telemedicine using audio/video telecommunications for a synchronous communication visit. Today's visit due to COVID-19 isolation precautions I connected with and verified that I am speaking with the correct person using two identifiers.   I discussed the limitations, risks, security and privacy concerns of performing an evaluation and management service by telecommunication and the availability of in person appointments. I also discussed with the patient that there may be a patient responsible charge related to this service. The patient expressed understanding and agreed to proceed. The patient's location is home. I am at home.   I discussed the assessment and treatment plan with the patient. The patient was provided an opportunity to ask questions and all were answered. The patient agreed with the plan and demonstrated an understanding of the instructions.   The patient was advised to call back or seek an in-person evaluation if the symptoms worsen or if the condition fails to improve as anticipated.   I provided 21+ minutes of time during this encounter.  Follow up plan: Return in about 6 months (around 02/06/2019) for Physical Exam.

## 2018-08-06 NOTE — Assessment & Plan Note (Signed)
Uses as needed Valtrex with good effect

## 2018-08-07 ENCOUNTER — Encounter: Payer: 59 | Admitting: Nurse Practitioner

## 2018-08-10 DIAGNOSIS — N200 Calculus of kidney: Secondary | ICD-10-CM

## 2018-08-10 HISTORY — DX: Calculus of kidney: N20.0

## 2018-08-12 ENCOUNTER — Encounter: Payer: 59 | Admitting: Nurse Practitioner

## 2018-08-12 ENCOUNTER — Other Ambulatory Visit: Payer: Self-pay

## 2018-08-12 ENCOUNTER — Ambulatory Visit (INDEPENDENT_AMBULATORY_CARE_PROVIDER_SITE_OTHER): Payer: 59 | Admitting: Nurse Practitioner

## 2018-08-12 ENCOUNTER — Encounter: Payer: Self-pay | Admitting: Nurse Practitioner

## 2018-08-12 VITALS — BP 118/74 | HR 62 | Temp 98.4°F

## 2018-08-12 DIAGNOSIS — H6120 Impacted cerumen, unspecified ear: Secondary | ICD-10-CM | POA: Insufficient documentation

## 2018-08-12 DIAGNOSIS — I10 Essential (primary) hypertension: Secondary | ICD-10-CM

## 2018-08-12 DIAGNOSIS — H6122 Impacted cerumen, left ear: Secondary | ICD-10-CM | POA: Diagnosis not present

## 2018-08-12 NOTE — Assessment & Plan Note (Signed)
Ceruminosis is noted to left ear.  Wax was removed by warm water rinse/pressure and manual debridement. Instructions for home care to prevent wax buildup are given.  Recommend OTC Debrox and avoidance of Qtips.  Return for worsening or continued issues.

## 2018-08-12 NOTE — Progress Notes (Signed)
BP 118/74   Pulse 62   Temp 98.4 F (36.9 C) (Oral)   SpO2 96%    Subjective:    Patient ID: Logan Willis, male    DOB: [birth year], 73 y.o.   MRN: [id number]  HPI: Logan Willis is a 73 y.o. male  Chief Complaint  Patient presents with  . Ear Problem    L ear, pt states it is closed up when he wakes up but then it opens up    EAR CLOGGED (LEFT SIDE) Wakes up in morning and it is clogged up, but throughout day improves.  Feels it may be ear wax.  Has not been doing any treatment at home.  Present time one month. Duration: months Involved ear(s): left Severity: no pain present Fever: no Otorrhea: no Upper respiratory infection symptoms: no Pruritus: no Hearing loss: no Water immersion no Using Q-tips: yes Recurrent otitis media: no Status: stable Treatments attempted: none  Relevant past medical, surgical, family and social history reviewed and updated as indicated. Interim medical history since our last visit reviewed. Allergies and medications reviewed and updated.  Review of Systems  Constitutional: Negative for activity change, diaphoresis, fatigue and fever.  HENT: Negative for ear discharge, ear pain and hearing loss.   Respiratory: Negative for cough, chest tightness, shortness of breath and wheezing.   Cardiovascular: Negative for chest pain, palpitations and leg swelling.  Gastrointestinal: Negative for abdominal distention, abdominal pain, constipation, diarrhea, nausea and vomiting.  Psychiatric/Behavioral: Negative.     Per HPI unless specifically indicated above     Objective:    BP 118/74   Pulse 62   Temp 98.4 F (36.9 C) (Oral)   SpO2 96%   Wt Readings from Last 3 Encounters:  02/05/18 236 lb (107 kg)  07/31/17 234 lb (106.1 kg)  01/29/17 237 lb (107.5 kg)    Physical Exam Vitals signs and nursing note reviewed.  Constitutional:      General: He is not in acute distress.    Appearance: He is well-developed. He is not ill-appearing.   HENT:     Head: Normocephalic and atraumatic.     Right Ear: Hearing, tympanic membrane, ear canal and external ear normal. No drainage.     Left Ear: Hearing, ear canal and external ear normal. No drainage. There is impacted cerumen.     Ears:     Comments: Small amount of cerumen in right ear, able to visualize TM.  Moderate amount left ear and unable to visualize TM.    Mouth/Throat:     Pharynx: Uvula midline.  Eyes:     General: Lids are normal.        Right eye: No discharge.        Left eye: No discharge.     Conjunctiva/sclera: Conjunctivae normal.     Pupils: Pupils are equal, round, and reactive to light.  Neck:     Musculoskeletal: Normal range of motion and neck supple.  Cardiovascular:     Rate and Rhythm: Normal rate and regular rhythm.     Heart sounds: Normal heart sounds, S1 normal and S2 normal. No murmur. No gallop.   Pulmonary:     Effort: Pulmonary effort is normal.     Breath sounds: Normal breath sounds.  Abdominal:     General: Bowel sounds are normal.     Palpations: Abdomen is soft.  Musculoskeletal: Normal range of motion.     Right lower leg: No edema.  Left lower leg: No edema.  Skin:    General: Skin is warm and dry.  Neurological:     Mental Status: He is alert and oriented to person, place, and time.  Psychiatric:        Mood and Affect: Mood normal.        Behavior: Behavior normal.        Thought Content: Thought content normal.        Judgment: Judgment normal.    Ceruminosis is noted to left ear.  Wax is removed by warm water rinse/pressure and manual debridement. Instructions for home care to prevent wax buildup are given.  Results for orders placed or performed in visit on 02/05/18  CBC with Differential/Platelet  Result Value Ref Range   WBC 3.6 3.4 - 10.8 x10E3/uL   RBC 5.02 4.14 - 5.80 x10E6/uL   Hemoglobin 15.2 13.0 - 17.7 g/dL   Hematocrit 43.1 37.5 - 51.0 %   MCV 86 79 - 97 fL   MCH 30.3 26.6 - 33.0 pg   MCHC 35.3 31.5  - 35.7 g/dL   RDW 13.4 11.6 - 15.4 %   Platelets 142 (L) 150 - 450 x10E3/uL   Neutrophils 65 Not Estab. %   Lymphs 26 Not Estab. %   Monocytes 6 Not Estab. %   Eos 2 Not Estab. %   Basos 1 Not Estab. %   Neutrophils Absolute 2.4 1.4 - 7.0 x10E3/uL   Lymphocytes Absolute 0.9 0.7 - 3.1 x10E3/uL   Monocytes Absolute 0.2 0.1 - 0.9 x10E3/uL   EOS (ABSOLUTE) 0.1 0.0 - 0.4 x10E3/uL   Basophils Absolute 0.0 0.0 - 0.2 x10E3/uL   Immature Granulocytes 0 Not Estab. %   Immature Grans (Abs) 0.0 0.0 - 0.1 x10E3/uL  Comprehensive metabolic panel  Result Value Ref Range   Glucose 90 65 - 99 mg/dL   BUN 18 8 - 27 mg/dL   Creatinine, Ser 1.17 0.76 - 1.27 mg/dL   GFR calc non Af Amer 63 >59 mL/min/1.73   GFR calc Af Amer 73 >59 mL/min/1.73   BUN/Creatinine Ratio 15 10 - 24   Sodium 141 134 - 144 mmol/L   Potassium 4.3 3.5 - 5.2 mmol/L   Chloride 105 96 - 106 mmol/L   CO2 22 20 - 29 mmol/L   Calcium 9.2 8.6 - 10.2 mg/dL   Total Protein 5.9 (L) 6.0 - 8.5 g/dL   Albumin 4.2 3.8 - 4.8 g/dL   Globulin, Total 1.7 1.5 - 4.5 g/dL   Albumin/Globulin Ratio 2.5 (H) 1.2 - 2.2   Bilirubin Total 0.5 0.0 - 1.2 mg/dL   Alkaline Phosphatase 51 39 - 117 IU/L   AST 17 0 - 40 IU/L   ALT 23 0 - 44 IU/L  TSH  Result Value Ref Range   TSH 1.720 0.450 - 4.500 uIU/mL  PSA  Result Value Ref Range   Prostate Specific Ag, Serum 2.4 0.0 - 4.0 ng/mL  Lipid panel  Result Value Ref Range   Cholesterol, Total 154 100 - 199 mg/dL   Triglycerides 69 0 - 149 mg/dL   HDL 42 >39 mg/dL   VLDL Cholesterol Cal 14 5 - 40 mg/dL   LDL Calculated 98 0 - 99 mg/dL   Chol/HDL Ratio 3.7 0.0 - 5.0 ratio  Urinalysis, Routine w reflex microscopic  Result Value Ref Range   Specific Gravity, UA 1.020 1.005 - 1.030   pH, UA 5.5 5.0 - 7.5   Color, UA Yellow Yellow  Appearance Ur Clear Clear   Leukocytes, UA Negative Negative   Protein, UA Negative Negative/Trace   Glucose, UA Negative Negative   Ketones, UA Negative Negative    RBC, UA Negative Negative   Bilirubin, UA Negative Negative   Urobilinogen, Ur 0.2 0.2 - 1.0 mg/dL   Nitrite, UA Negative Negative      Assessment & Plan:   Problem List Items Addressed This Visit      Cardiovascular and Mediastinum   Essential hypertension   Relevant Orders   Basic metabolic panel     Nervous and Auditory   Cerumen impaction - Primary    Ceruminosis is noted to left ear.  Wax was removed by warm water rinse/pressure and manual debridement. Instructions for home care to prevent wax buildup are given.  Recommend OTC Debrox and avoidance of Qtips.  Return for worsening or continued issues.          Follow up plan: Return if symptoms worsen or fail to improve.

## 2018-08-12 NOTE — Patient Instructions (Signed)
DEBROX  Earwax Buildup, Adult The ears produce a substance called earwax that helps keep bacteria out of the ear and protects the skin in the ear canal. Occasionally, earwax can build up in the ear and cause discomfort or hearing loss. What increases the risk? This condition is more likely to develop in people who:  Are male.  Are elderly.  Naturally produce more earwax.  Clean their ears often with cotton swabs.  Use earplugs often.  Use in-ear headphones often.  Wear hearing aids.  Have narrow ear canals.  Have earwax that is overly thick or sticky.  Have eczema.  Are dehydrated.  Have excess hair in the ear canal. What are the signs or symptoms? Symptoms of this condition include:  Reduced or muffled hearing.  A feeling of fullness in the ear or feeling that the ear is plugged.  Fluid coming from the ear.  Ear pain.  Ear itch.  Ringing in the ear.  Coughing.  An obvious piece of earwax that can be seen inside the ear canal. How is this diagnosed? This condition may be diagnosed based on:  Your symptoms.  Your medical history.  An ear exam. During the exam, your health care provider will look into your ear with an instrument called an otoscope. You may have tests, including a hearing test. How is this treated? This condition may be treated by:  Using ear drops to soften the earwax.  Having the earwax removed by a health care provider. The health care provider may: ? Flush the ear with water. ? Use an instrument that has a loop on the end (curette). ? Use a suction device.  Surgery to remove the wax buildup. This may be done in severe cases. Follow these instructions at home:   Take over-the-counter and prescription medicines only as told by your health care provider.  Do not put any objects, including cotton swabs, into your ear. You can clean the opening of your ear canal with a washcloth or facial tissue.  Follow instructions from your  health care provider about cleaning your ears. Do not over-clean your ears.  Drink enough fluid to keep your urine clear or pale yellow. This will help to thin the earwax.  Keep all follow-up visits as told by your health care provider. If earwax builds up in your ears often or if you use hearing aids, consider seeing your health care provider for routine, preventive ear cleanings. Ask your health care provider how often you should schedule your cleanings.  If you have hearing aids, clean them according to instructions from the manufacturer and your health care provider. Contact a health care provider if:  You have ear pain.  You develop a fever.  You have blood, pus, or other fluid coming from your ear.  You have hearing loss.  You have ringing in your ears that does not go away.  Your symptoms do not improve with treatment.  You feel like the room is spinning (vertigo). Summary  Earwax can build up in the ear and cause discomfort or hearing loss.  The most common symptoms of this condition include reduced or muffled hearing and a feeling of fullness in the ear or feeling that the ear is plugged.  This condition may be diagnosed based on your symptoms, your medical history, and an ear exam.  This condition may be treated by using ear drops to soften the earwax or by having the earwax removed by a health care provider.  Do not  put any objects, including cotton swabs, into your ear. You can clean the opening of your ear canal with a washcloth or facial tissue. This information is not intended to replace advice given to you by your health care provider. Make sure you discuss any questions you have with your health care provider. Document Released: 02/03/2004 Document Revised: 12/08/2016 Document Reviewed: 03/08/2016 Elsevier Patient Education  2020 [employer].

## 2018-08-13 LAB — BASIC METABOLIC PANEL
BUN/Creatinine Ratio: 18 (ref 10–24)
BUN: 22 mg/dL (ref 8–27)
CO2: 21 mmol/L (ref 20–29)
Calcium: 9.3 mg/dL (ref 8.6–10.2)
Chloride: 104 mmol/L (ref 96–106)
Creatinine, Ser: 1.24 mg/dL (ref 0.76–1.27)
GFR calc Af Amer: 67 mL/min/{1.73_m2} (ref >59)
GFR calc non Af Amer: 58 mL/min/{1.73_m2} — ABNORMAL LOW (ref >59)
Glucose: 79 mg/dL (ref 65–99)
Potassium: 5 mmol/L (ref 3.5–5.2)
Sodium: 141 mmol/L (ref 134–144)

## 2018-08-13 NOTE — Progress Notes (Signed)
Normal test results noted.  Please call patient and make them aware of normal results and will continue to monitor at regular visits.  Have a great day.  Look forward to seeing you at your next visit.

## 2018-08-19 ENCOUNTER — Observation Stay
Admission: EM | Admit: 2018-08-19 | Discharge: 2018-08-20 | Disposition: A | Discharge status: Home / Self Care | Payer: No Typology Code available for payment source | Attending: Internal Medicine | Admitting: Internal Medicine

## 2018-08-19 ENCOUNTER — Other Ambulatory Visit: Payer: Self-pay

## 2018-08-19 ENCOUNTER — Emergency Department: Payer: No Typology Code available for payment source

## 2018-08-19 DIAGNOSIS — Z79899 Other long term (current) drug therapy: Secondary | ICD-10-CM | POA: Diagnosis not present

## 2018-08-19 DIAGNOSIS — N179 Acute kidney failure, unspecified: Secondary | ICD-10-CM | POA: Diagnosis not present

## 2018-08-19 DIAGNOSIS — N281 Cyst of kidney, acquired: Secondary | ICD-10-CM | POA: Insufficient documentation

## 2018-08-19 DIAGNOSIS — Z20828 Contact with and (suspected) exposure to other viral communicable diseases: Secondary | ICD-10-CM | POA: Insufficient documentation

## 2018-08-19 DIAGNOSIS — N200 Calculus of kidney: Secondary | ICD-10-CM

## 2018-08-19 DIAGNOSIS — N132 Hydronephrosis with renal and ureteral calculous obstruction: Principal | ICD-10-CM | POA: Insufficient documentation

## 2018-08-19 DIAGNOSIS — N201 Calculus of ureter: Secondary | ICD-10-CM

## 2018-08-19 DIAGNOSIS — N401 Enlarged prostate with lower urinary tract symptoms: Secondary | ICD-10-CM | POA: Insufficient documentation

## 2018-08-19 DIAGNOSIS — N138 Other obstructive and reflux uropathy: Secondary | ICD-10-CM | POA: Diagnosis not present

## 2018-08-19 DIAGNOSIS — I10 Essential (primary) hypertension: Secondary | ICD-10-CM | POA: Diagnosis not present

## 2018-08-19 DIAGNOSIS — K5900 Constipation, unspecified: Secondary | ICD-10-CM | POA: Diagnosis not present

## 2018-08-19 DIAGNOSIS — Z87442 Personal history of urinary calculi: Secondary | ICD-10-CM | POA: Insufficient documentation

## 2018-08-19 DIAGNOSIS — Z8249 Family history of ischemic heart disease and other diseases of the circulatory system: Secondary | ICD-10-CM | POA: Insufficient documentation

## 2018-08-19 LAB — COMPREHENSIVE METABOLIC PANEL
ALT: 22 U/L (ref 0–44)
AST: 18 U/L (ref 15–41)
Albumin: 4.4 g/dL (ref 3.5–5.0)
Alkaline Phosphatase: 48 U/L (ref 38–126)
Anion gap: 9 (ref 5–15)
BUN: 21 mg/dL (ref 8–23)
CO2: 23 mmol/L (ref 22–32)
Calcium: 9.1 mg/dL (ref 8.9–10.3)
Chloride: 108 mmol/L (ref 98–111)
Creatinine, Ser: 1.63 mg/dL — ABNORMAL HIGH (ref 0.61–1.24)
GFR calc Af Amer: 48 mL/min — ABNORMAL LOW (ref >60)
GFR calc non Af Amer: 42 mL/min — ABNORMAL LOW (ref >60)
Glucose, Bld: 113 mg/dL — ABNORMAL HIGH (ref 70–99)
Potassium: 4.3 mmol/L (ref 3.5–5.1)
Sodium: 140 mmol/L (ref 135–145)
Total Bilirubin: 1.3 mg/dL — ABNORMAL HIGH (ref 0.3–1.2)
Total Protein: 6.8 g/dL (ref 6.5–8.1)

## 2018-08-19 LAB — URINALYSIS, COMPLETE (UACMP) WITH MICROSCOPIC
Bacteria, UA: NONE SEEN
Bilirubin Urine: NEGATIVE
Glucose, UA: NEGATIVE mg/dL
Ketones, ur: 5 mg/dL — AB
Leukocytes,Ua: NEGATIVE
Nitrite: NEGATIVE
Protein, ur: 100 mg/dL — AB
RBC / HPF: >50 RBC/hpf — ABNORMAL HIGH (ref 0–5)
Specific Gravity, Urine: 1.03 (ref 1.005–1.030)
Squamous Epithelial / LPF: NONE SEEN (ref 0–5)
pH: 5 (ref 5.0–8.0)

## 2018-08-19 LAB — SARS CORONAVIRUS 2 (TAT 6-24 HRS): SARS Coronavirus 2: NEGATIVE

## 2018-08-19 LAB — CBC
HCT: 44.5 % (ref 39.0–52.0)
Hemoglobin: 15.1 g/dL (ref 13.0–17.0)
MCH: 29.3 pg (ref 26.0–34.0)
MCHC: 33.9 g/dL (ref 30.0–36.0)
MCV: 86.2 fL (ref 80.0–100.0)
Platelets: 159 10*3/uL (ref 150–400)
RBC: 5.16 MIL/uL (ref 4.22–5.81)
RDW: 14.1 % (ref 11.5–15.5)
WBC: 9.9 10*3/uL (ref 4.0–10.5)
nRBC: 0 % (ref 0.0–0.2)

## 2018-08-19 MED ORDER — MORPHINE SULFATE (PF) 2 MG/ML IV SOLN
INTRAVENOUS | Status: AC
  Filled 2018-08-19: qty 2

## 2018-08-19 MED ORDER — ONDANSETRON HCL 4 MG/2ML IJ SOLN: 4.0000 mg | Freq: Four times a day (QID) | INTRAMUSCULAR | Status: DC | PRN

## 2018-08-19 MED ORDER — SODIUM CHLORIDE 0.9 % IV SOLN
INTRAVENOUS | Status: DC
  Administered 2018-08-19 (×2): via INTRAVENOUS

## 2018-08-19 MED ORDER — IOHEXOL 300 MG/ML  SOLN
100.0000 mL | Freq: Once | INTRAMUSCULAR | Status: AC | PRN
  Administered 2018-08-19: 06:00:00 100 mL via INTRAVENOUS

## 2018-08-19 MED ORDER — ACETAMINOPHEN 650 MG RE SUPP: 650.0000 mg | Freq: Four times a day (QID) | RECTAL | Status: DC | PRN

## 2018-08-19 MED ORDER — MORPHINE SULFATE (PF) 4 MG/ML IV SOLN
4.0000 mg | Freq: Once | INTRAVENOUS | Status: AC
  Administered 2018-08-19: 07:00:00 4 mg via INTRAVENOUS

## 2018-08-19 MED ORDER — OXYCODONE-ACETAMINOPHEN 5-325 MG PO TABS: 1.0000 | ORAL_TABLET | ORAL | 0 refills | Status: DC | PRN

## 2018-08-19 MED ORDER — HYDRALAZINE HCL 20 MG/ML IJ SOLN: 10.0000 mg | Freq: Four times a day (QID) | INTRAMUSCULAR | Status: DC | PRN

## 2018-08-19 MED ORDER — HYDROCODONE-ACETAMINOPHEN 5-325 MG PO TABS
1.0000 | ORAL_TABLET | ORAL | Status: DC | PRN
  Administered 2018-08-19: 1 via ORAL
  Administered 2018-08-19 (×2): 2 via ORAL
  Filled 2018-08-19: qty 2
  Filled 2018-08-19: qty 1
  Filled 2018-08-19 (×2): qty 2

## 2018-08-19 MED ORDER — ENOXAPARIN SODIUM 40 MG/0.4ML ~~LOC~~ SOLN
40.0000 mg | SUBCUTANEOUS | Status: DC
  Administered 2018-08-19: 20:00:00 40 mg via SUBCUTANEOUS
  Filled 2018-08-19: qty 0.4

## 2018-08-19 MED ORDER — MORPHINE SULFATE (PF) 2 MG/ML IV SOLN: 2.0000 mg | INTRAVENOUS | Status: DC | PRN

## 2018-08-19 MED ORDER — HYDROMORPHONE HCL 1 MG/ML IJ SOLN
1.0000 mg | INTRAMUSCULAR | Status: DC | PRN
  Administered 2018-08-19: 09:00:00 1 mg via INTRAVENOUS

## 2018-08-19 MED ORDER — SODIUM CHLORIDE 0.9 % IV SOLN: INTRAVENOUS | Status: DC

## 2018-08-19 MED ORDER — MORPHINE SULFATE (PF) 4 MG/ML IV SOLN
INTRAVENOUS | Status: AC
  Filled 2018-08-19: qty 1

## 2018-08-19 MED ORDER — ACETAMINOPHEN 325 MG PO TABS: 650.0000 mg | ORAL_TABLET | Freq: Four times a day (QID) | ORAL | Status: DC | PRN

## 2018-08-19 MED ORDER — TAMSULOSIN HCL 0.4 MG PO CAPS
0.4000 mg | ORAL_CAPSULE | Freq: Every day | ORAL | Status: DC
  Administered 2018-08-19 – 2018-08-20 (×2): 0.4 mg via ORAL
  Filled 2018-08-19 (×2): qty 1

## 2018-08-19 MED ORDER — TRIAMCINOLONE ACETONIDE 0.1 % EX CREA
1.0000 "application " | TOPICAL_CREAM | Freq: Two times a day (BID) | CUTANEOUS | Status: DC
  Filled 2018-08-19: qty 15

## 2018-08-19 MED ORDER — MORPHINE SULFATE (PF) 4 MG/ML IV SOLN
4.0000 mg | Freq: Once | INTRAVENOUS | Status: AC
  Administered 2018-08-19: 4 mg via INTRAVENOUS

## 2018-08-19 MED ORDER — MORPHINE SULFATE (PF) 4 MG/ML IV SOLN
4.0000 mg | Freq: Once | INTRAVENOUS | Status: AC
  Administered 2018-08-19: 4 mg via INTRAVENOUS
  Filled 2018-08-19: qty 1

## 2018-08-19 MED ORDER — ONDANSETRON HCL 4 MG/2ML IJ SOLN
4.0000 mg | Freq: Once | INTRAMUSCULAR | Status: AC
  Administered 2018-08-19: 06:00:00 4 mg via INTRAVENOUS
  Filled 2018-08-19: qty 2

## 2018-08-19 MED ORDER — ONDANSETRON HCL 4 MG PO TABS: 4.0000 mg | ORAL_TABLET | Freq: Four times a day (QID) | ORAL | Status: DC | PRN

## 2018-08-19 MED ORDER — BENAZEPRIL HCL 20 MG PO TABS: 20.0000 mg | ORAL_TABLET | Freq: Every day | ORAL | Status: DC

## 2018-08-19 MED ORDER — SENNOSIDES-DOCUSATE SODIUM 8.6-50 MG PO TABS: 1.0000 | ORAL_TABLET | Freq: Every evening | ORAL | Status: DC | PRN

## 2018-08-19 MED ORDER — HYDROMORPHONE HCL 1 MG/ML IJ SOLN
INTRAMUSCULAR | Status: AC
  Filled 2018-08-19: qty 1

## 2018-08-19 NOTE — ED Provider Notes (Signed)
Patient with continued pain after multiple doses of morphine will admit to the hospital service   Lavonia Drafts, MD 08/19/18 [phone]

## 2018-08-19 NOTE — ED Notes (Signed)
ED TO INPATIENT HANDOFF REPORT  ED Nurse Name and Phone #: Gwenette Greet Pana Name/Age/Gender [facility] 73 y.o. male Room/Bed: ED05A/ED05A  Code Status   Code Status: Not on file  Home/SNF/Other Home Patient oriented to: self, place, time and situation Is this baseline? Yes   Triage Complete: Triage complete  Chief Complaint [name]  Triage Note Patient c/o left flank pain radiating to abdomen. Patient reports hx of kidney stones.    Allergies No Known Allergies  Level of Care/Admitting Diagnosis ED Disposition    ED Disposition Condition Comment   Admit  The patient appears reasonably stabilized for admission considering the current resources, flow, and capabilities available in the ED at this time, and I doubt any other [facility] requiring further screening and/or treatment in the ED prior to admission is  present.       B Medical/Surgery History Past Medical History:  Diagnosis Date  . Body mass index 35.0-35.9, adult   . Cold sore    Past Surgical History:  Procedure Laterality Date  . COLON SURGERY    . COLONOSCOPY    . COLONOSCOPY WITH PROPOFOL N/A 12/10/2014   Procedure: COLONOSCOPY WITH PROPOFOL;  Surgeon: Lucilla Lame, MD;  Location: [facility];  Service: Endoscopy;  Laterality: N/A;  . HERNIA REPAIR       A IV Location/Drains/Wounds Patient Lines/Drains/Airways Status   Active Line/Drains/Airways    Name:   Placement date:   Placement time:   Site:   Days:   Peripheral IV 08/19/18 Right Antecubital   08/19/18    0530    Antecubital   less than 1          Intake/Output Last 24 hours No intake or output data in the 24 hours ending 08/19/18 0753  Labs/Imaging Results for orders placed or performed during the hospital encounter of 08/19/18 (from the past 48 hour(s))  Urinalysis, Complete w Microscopic     Status: Abnormal   Collection Time: 08/19/18  5:30 AM  Result Value Ref Range   Color, Urine AMBER (A) YELLOW    Comment:  BIOCHEMICALS MAY BE AFFECTED BY COLOR   APPearance HAZY (A) CLEAR   Specific Gravity, Urine 1.030 1.005 - 1.030   pH 5.0 5.0 - 8.0   Glucose, UA NEGATIVE NEGATIVE mg/dL   Hgb urine dipstick LARGE (A) NEGATIVE   Bilirubin Urine NEGATIVE NEGATIVE   Ketones, ur 5 (A) NEGATIVE mg/dL   Protein, ur 100 (A) NEGATIVE mg/dL   Nitrite NEGATIVE NEGATIVE   Leukocytes,Ua NEGATIVE NEGATIVE   RBC / HPF >50 (H) 0 - 5 RBC/hpf   WBC, UA 21-50 0 - 5 WBC/hpf   Bacteria, UA NONE SEEN NONE SEEN   Squamous Epithelial / LPF NONE SEEN 0 - 5   Mucus PRESENT     Comment: Performed at [facility], [address] [place], [place] [address]  CBC     Status: None   Collection Time: 08/19/18  5:30 AM  Result Value Ref Range   WBC 9.9 4.0 - 10.5 K/uL   RBC 5.16 4.22 - 5.81 MIL/uL   Hemoglobin 15.1 13.0 - 17.0 g/dL   HCT 44.5 39.0 - 52.0 %   MCV 86.2 80.0 - 100.0 fL   MCH 29.3 26.0 - 34.0 pg   MCHC 33.9 30.0 - 36.0 g/dL   RDW 14.1 11.5 - 15.5 %   Platelets 159 150 - 400 K/uL   nRBC 0.0 0.0 - 0.2 %  Comment: Performed at [facility], [address] [place], [place] [address]  Comprehensive metabolic panel     Status: Abnormal   Collection Time: 08/19/18  5:30 AM  Result Value Ref Range   Sodium 140 135 - 145 mmol/L   Potassium 4.3 3.5 - 5.1 mmol/L   Chloride 108 98 - 111 mmol/L   CO2 23 22 - 32 mmol/L   Glucose, Bld 113 (H) 70 - 99 mg/dL   BUN 21 8 - 23 mg/dL   Creatinine, Ser 1.63 (H) 0.61 - 1.24 mg/dL   Calcium 9.1 8.9 - 10.3 mg/dL   Total Protein 6.8 6.5 - 8.1 g/dL   Albumin 4.4 3.5 - 5.0 g/dL   AST 18 15 - 41 U/L   ALT 22 0 - 44 U/L   Alkaline Phosphatase 48 38 - 126 U/L   Total Bilirubin 1.3 (H) 0.3 - 1.2 mg/dL   GFR calc non Af Amer 42 (L) >60 mL/min   GFR calc Af Amer 48 (L) >60 mL/min   Anion gap 9 5 - 15    Comment: Performed at [facility], [address] [place], [place] [address]   Ct Abdomen Pelvis W Contrast  Result Date: 08/19/2018 CLINICAL  DATA:  Flank pain with stone disease suspected EXAM: CT ABDOMEN AND PELVIS WITH CONTRAST TECHNIQUE: Multidetector CT imaging of the abdomen and pelvis was performed using the standard protocol following bolus administration of intravenous contrast. CONTRAST:  126mL OMNIPAQUE IOHEXOL 300 MG/ML  SOLN COMPARISON:  Noncontrast CT earlier the same day FINDINGS: Lower chest:  No contributory findings. Hepatobiliary: No focal liver abnormality.No evidence of biliary obstruction or stone. Pancreas: Unremarkable. Spleen: Unremarkable. Adrenals/Urinary Tract: Negative adrenals. 9 x 4 mm stone at the left UPJ with irregular shape. There is left hydronephrosis and delayed renal excretion . Right renal cysts including a 21 cm simple cyst projecting anteriorly and inferiorly from the kidney with parenchymal distortion. Pelviectasis on the right without hydronephrosis. Stomach/Bowel: No obstruction. Appendectomy. Mild left colonic diverticulosis. Vascular/Lymphatic: No acute vascular abnormality. No mass or adenopathy. Reproductive:Symmetric enlargement of the prostate gland. Other: No ascites or pneumoperitoneum. Umbilical hernia repair using mesh. Musculoskeletal: No acute abnormalities. Spondylosis with mild lumbar dextrocurvature. IMPRESSION: 1. Obstructing 9 x 4 mm left UPJ calculus. 2. Right renal cysts measuring up to 21 cm. Electronically Signed   By: Monte Fantasia M.D.   On: 08/19/2018 06:53   Ct Renal Stone Study  Result Date: 08/19/2018 CLINICAL DATA:  Sudden onset left flank pain EXAM: CT ABDOMEN AND PELVIS WITHOUT CONTRAST TECHNIQUE: Multidetector CT imaging of the abdomen and pelvis was performed following the standard protocol without IV contrast. COMPARISON:  08/04/2015 FINDINGS: Lower chest:  No contributory findings. Hepatobiliary: No focal liver abnormality.No evidence of biliary obstruction or stone. Pancreas: Unremarkable. Spleen: Unremarkable. Adrenals/Urinary Tract: Negative adrenals.  Irregularly-shaped stone at the left UPJ measuring 8 x 4 mm, with mild hydronephrosis and perinephric stranding.Large right renal cysts measuring up to 21 cm at the right lower kidney where there is renal parenchymal distortion. Stomach/Bowel: No obstruction. Appendectomy. Mild left colonic diverticulosis. Vascular/Lymphatic: No acute vascular abnormality. No mass or adenopathy. Reproductive:Mild symmetric prostate enlargement. Other: No ascites or pneumoperitoneum. Umbilical hernia repair using mesh Musculoskeletal: No acute abnormalities. IMPRESSION: 1. 8 x 4 mm left UPJ calculus causing mild hydronephrosis. 2. Right renal cysts measuring up to 21 cm. Electronically Signed   By: Monte Fantasia M.D.   On: 08/19/2018 06:31    Pending Labs [employer] (  From admission, onward)    Start     Ordered   08/19/18 0738  SARS CORONAVIRUS 2 Nasal Swab Aptima Multi Swab  (Asymptomatic/Tier 2 Patients Labs)  Once,   STAT    Question Answer Comment  Is this test for diagnosis or screening Screening   Symptomatic for COVID-19 as defined by CDC No   Hospitalized for COVID-19 No   Admitted to ICU for COVID-19 No   Previously tested for COVID-19 No   Resident in a congregate (group) care setting No   Employed in healthcare setting No      08/19/18 0737          Vitals/Pain Today's Vitals   08/19/18 0548 08/19/18 0645 08/19/18 0700 08/19/18 0743  BP:      Pulse:  63 61   Resp:      Temp:      TempSrc:      SpO2:  97% 96%   Weight:      Height:      PainSc: 8    4     Isolation Precautions No active isolations  Medications Medications  morphine 2 MG/ML injection (  Not Given 08/19/18 0736)  morphine 4 MG/ML injection (has no administration in time range)  morphine 4 MG/ML injection 4 mg (4 mg Intravenous Given 08/19/18 0549)  ondansetron (ZOFRAN) injection 4 mg (4 mg Intravenous Given 08/19/18 0549)  iohexol (OMNIPAQUE) 300 MG/ML solution 100 mL (100 mLs Intravenous Contrast Given 08/19/18  0608)  morphine 4 MG/ML injection 4 mg (4 mg Intravenous Given 08/19/18 0645)  morphine 4 MG/ML injection 4 mg (4 mg Intravenous Given 08/19/18 0736)    Mobility walks Low fall risk   Focused Assessments see assessments   R Recommendations: See Admitting Provider Note  Report given to:   Additional Notes: See [facility]

## 2018-08-19 NOTE — ED Triage Notes (Signed)
Patient c/o left flank pain radiating to abdomen. Patient reports hx of kidney stones.

## 2018-08-19 NOTE — H&P (Addendum)
[place] [facility] [place] NAME: [facility]    MR#:  [id number]  DATE OF BIRTH:  [birth year]  DATE OF ADMISSION:  08/19/2018  PRIMARY CARE PHYSICIAN: Guadalupe Maple, MD   REQUESTING/REFERRING PHYSICIAN: Lavonia Drafts, MD  CHIEF COMPLAINT:   Chief Complaint  Patient presents with   Flank Pain    HISTORY OF PRESENT ILLNESS:  73 year old male with past medical history of benign prostatic hyperplasia with urinary obstruction, kidney stones, and essential hypertension presenting to the ED with complaints of left flank pain with radiation to the left lower quadrant.  Patient reports onset of symptoms since Saturday which has become progressively more symptomatic over the past few days.  He has prior history of kidney stone and tubulovillous adenoma.  He denies dysuria, hematuria, fevers or chills, diarrhea or constipation. Patient however stated that he had nausea and vomiting x 1 on Sunday.  On arrival to the ED, he was afebrile with blood pressure 161/23mm Hg and pulse rate 65 beats/min. There were no focal neurological deficits; he was alert and oriented x4.  Initial labs revealed a creatinine level of 1.63, which was increased from his baseline of 1.24 from 7 days ago.  CBC unremarkable.  UA negative for UTI.  Renal ultrasound and CT abdomen and pelvis findings shows 8 x 4 mm left UPJ calculus causing mild hydronephrosis with right renal cyst consistent with ureteropelvic junction (UPJ)obstruction.  Due to continued pain after multiple doses of morphine patient was admitted to the hospitalist service for further management.  PAST MEDICAL HISTORY:   Past Medical History:  Diagnosis Date   Body mass index 35.0-35.9, adult    Cold sore     PAST SURGICAL HISTORY:   Past Surgical History:  Procedure Laterality Date   COLON SURGERY     COLONOSCOPY     COLONOSCOPY WITH PROPOFOL N/A 12/10/2014   Procedure: COLONOSCOPY WITH PROPOFOL;   Surgeon: Lucilla Lame, MD;  Location: [place];  Service: Endoscopy;  Laterality: N/A;   HERNIA REPAIR      SOCIAL HISTORY:   Social History   Tobacco Use   Smoking status: Never Smoker   Smokeless tobacco: Never Used  Substance Use Topics   Alcohol use: No    FAMILY HISTORY:   Family History  Problem Relation Age of Onset   Heart disease Mother 52   Heart disease Father 22   Diabetes Sister    Stroke Sister    Cancer Neg Hx    COPD Neg Hx     DRUG ALLERGIES:  No Known Allergies  REVIEW OF SYSTEMS:   Review of Systems  Constitutional: Negative for chills, fever, malaise/fatigue and weight loss.  HENT: Negative for congestion, hearing loss and sore throat.   Eyes: Negative for blurred vision and double vision.  Respiratory: Negative for cough, shortness of breath and wheezing.   Cardiovascular: Negative for chest pain, palpitations, orthopnea and leg swelling.  Gastrointestinal: Positive for nausea and vomiting. Negative for abdominal pain and diarrhea.  Genitourinary: Positive for flank pain. Negative for dysuria and urgency.  Musculoskeletal: Negative for myalgias.  Skin: Negative for rash.  Neurological: Negative for dizziness, sensory change, speech change, focal weakness and headaches.  Psychiatric/Behavioral: Negative for depression.   MEDICATIONS AT HOME:   Prior to Admission medications   Medication Sig Start Date End Date Taking? Authorizing Provider  benazepril (LOTENSIN) 20 MG tablet Take 1 tablet (20 mg total) by mouth daily. 02/05/18  Guadalupe Maple, MD  fexofenadine-pseudoephedrine (ALLEGRA-D 24) 180-240 MG 24 hr tablet Take 1 tablet by mouth daily as needed.    [provider]  oxyCODONE-acetaminophen (PERCOCET) 5-325 MG tablet Take 1 tablet by mouth every 4 (four) hours as needed. 08/19/18 08/19/19  Gregor Hams, MD  tamsulosin (FLOMAX) 0.4 MG CAPS capsule Take 1 capsule (0.4 mg total) by mouth daily. 02/05/18    Guadalupe Maple, MD  triamcinolone cream (KENALOG) 0.1 % Apply 1 application topically 2 (two) times daily. 08/06/18   Guadalupe Maple, MD  valACYclovir (VALTREX) 500 MG tablet Take 2 tablets (1,000 mg total) by mouth 2 (two) times daily. 02/05/18   Guadalupe Maple, MD      VITAL SIGNS:  Blood pressure 137/79, pulse 67, temperature 98.2 F (36.8 C), temperature source Oral, resp. rate 18, height 5\' 10"  (1.778 m), weight 102.1 kg, SpO2 97 %.  PHYSICAL EXAMINATION:   Physical Exam  GENERAL:  73 y.o.-year-old patient lying in the bed with no acute distress.  EYES: Pupils equal, round, reactive to light and accommodation. No scleral icterus. Extraocular muscles intact.  HEENT: Head atraumatic, normocephalic. Oropharynx and nasopharynx clear.  NECK:  Supple, no jugular venous distention. No thyroid enlargement, no tenderness.  LUNGS: Normal breath sounds bilaterally, no wheezing, rales,rhonchi or crepitation. No use of accessory muscles of respiration.  CARDIOVASCULAR: S1, S2 normal. No murmurs, rubs, or gallops.  ABDOMEN: Mild left costovertebral angle tenderness.  Nondistended. Bowel sounds present. No organomegaly or mass.  EXTREMITIES: No pedal edema, cyanosis, or clubbing. No rash or lesions. + pedal pulses MUSCULOSKELETAL: Normal bulk, and power was 5+ grip and elbow, knee, and ankle flexion and extension bilaterally.  NEUROLOGIC:Alert and oriented x 3. CN 2-12 intact. Sensation to light touch and cold stimuli intact bilaterally. . Babinski is downgoing. DTR's (biceps, patellar, and achilles) 2+ and symmetric throughout. Gait not tested due to safety concern. PSYCHIATRIC: The patient is alert and oriented x 3.  SKIN: No obvious rash, lesion, or ulcer.    DATA REVIEWED:  LABORATORY PANEL:   CBC Recent Labs  Lab 08/19/18 0530  WBC 9.9  HGB 15.1  HCT 44.5  PLT 159    ------------------------------------------------------------------------------------------------------------------  Chemistries  Recent Labs  Lab 08/19/18 0530  NA 140  K 4.3  CL 108  CO2 23  GLUCOSE 113*  BUN 21  CREATININE 1.63*  CALCIUM 9.1  AST 18  ALT 22  ALKPHOS 48  BILITOT 1.3*   ------------------------------------------------------------------------------------------------------------------  Cardiac Enzymes No results for input(s): TROPONINI in the last 168 hours. ------------------------------------------------------------------------------------------------------------------  RADIOLOGY:  Ct Abdomen Pelvis W Contrast  Result Date: 08/19/2018 CLINICAL DATA:  Flank pain with stone disease suspected EXAM: CT ABDOMEN AND PELVIS WITH CONTRAST TECHNIQUE: Multidetector CT imaging of the abdomen and pelvis was performed using the standard protocol following bolus administration of intravenous contrast. CONTRAST:  167mL OMNIPAQUE IOHEXOL 300 MG/ML  SOLN COMPARISON:  Noncontrast CT earlier the same day FINDINGS: Lower chest:  No contributory findings. Hepatobiliary: No focal liver abnormality.No evidence of biliary obstruction or stone. Pancreas: Unremarkable. Spleen: Unremarkable. Adrenals/Urinary Tract: Negative adrenals. 9 x 4 mm stone at the left UPJ with irregular shape. There is left hydronephrosis and delayed renal excretion . Right renal cysts including a 21 cm simple cyst projecting anteriorly and inferiorly from the kidney with parenchymal distortion. Pelviectasis on the right without hydronephrosis. Stomach/Bowel: No obstruction. Appendectomy. Mild left colonic diverticulosis. Vascular/Lymphatic: No acute vascular abnormality. No mass or adenopathy. Reproductive:Symmetric enlargement of the  prostate gland. Other: No ascites or pneumoperitoneum. Umbilical hernia repair using mesh. Musculoskeletal: No acute abnormalities. Spondylosis with mild lumbar dextrocurvature.  IMPRESSION: 1. Obstructing 9 x 4 mm left UPJ calculus. 2. Right renal cysts measuring up to 21 cm. Electronically Signed   By: Monte Fantasia M.D.   On: 08/19/2018 06:53   Ct Renal Stone Study  Result Date: 08/19/2018 CLINICAL DATA:  Sudden onset left flank pain EXAM: CT ABDOMEN AND PELVIS WITHOUT CONTRAST TECHNIQUE: Multidetector CT imaging of the abdomen and pelvis was performed following the standard protocol without IV contrast. COMPARISON:  08/04/2015 FINDINGS: Lower chest:  No contributory findings. Hepatobiliary: No focal liver abnormality.No evidence of biliary obstruction or stone. Pancreas: Unremarkable. Spleen: Unremarkable. Adrenals/Urinary Tract: Negative adrenals. Irregularly-shaped stone at the left UPJ measuring 8 x 4 mm, with mild hydronephrosis and perinephric stranding.Large right renal cysts measuring up to 21 cm at the right lower kidney where there is renal parenchymal distortion. Stomach/Bowel: No obstruction. Appendectomy. Mild left colonic diverticulosis. Vascular/Lymphatic: No acute vascular abnormality. No mass or adenopathy. Reproductive:Mild symmetric prostate enlargement. Other: No ascites or pneumoperitoneum. Umbilical hernia repair using mesh Musculoskeletal: No acute abnormalities. IMPRESSION: 1. 8 x 4 mm left UPJ calculus causing mild hydronephrosis. 2. Right renal cysts measuring up to 21 cm. Electronically Signed   By: Monte Fantasia M.D.   On: 08/19/2018 06:31    EKG:  EKG: there are no previous tracings available for comparison.  IMPRESSION AND PLAN:   73 y.o. male past medical history of benign prostatic hyperplasia with urinary obstruction, kidney stones, and essential hypertension presenting to the ED with complaints of left flank pain with radiation to the left lower quadrant.  1. Left flank pain - Patient with hx of kidney stones and BPH - CT abdomen/pelvis and CT renal study shows 8 x 4 mm left UPJ calculus causing mild hydronephrosis and right renal  cysts - Admit to medsurg unit for observation - Conservative management - No signs of pyelonephritis - Monitor renal function - Keep NPO - IVFs hydration while NPO - Morphine IV/PO PRN pain - Ondansetron PRN nausea/vomiting - Urology consult   2.  AKI -Likely Postrenal due to obstruction - Avoid nephrotoxins - IVFs - Monitor renal function   3. HTN + Goal BP <130/80 -Hold Benazepril in the setting of AKI  4. Hx of BPH - Flomax  5. DVT prophylaxis - Hold anti-coagulation for possible pyeloplasty   All the records are reviewed and case discussed with ED provider. Management plans discussed with the patient, family and they are in agreement.  CODE STATUS: FULL  TOTAL TIME TAKING CARE OF THIS PATIENT: 50 minutes.    on 08/19/2018 at 8:33 AM  Rufina Falco, DNP, FNP-BC [facility] Nurse Practitioner Between 7am to 6pm - Pager [phone]  After 6pm go to [email or web address] - password [email or web address] [facility]  Office  [phone]  CC: Primary care physician; Guadalupe Maple, MD

## 2018-08-19 NOTE — Progress Notes (Signed)
Advanced care plan. Purpose of the Encounter: CODE STATUS Parties in Attendance: Patient Patient's Decision Capacity: Good Subjective/Patient's story: 73 year old male with past medical history of benign prostatic hyperplasia with urinary obstruction, kidney stones, and essential hypertension presenting to the ED with complaints of left flank pain with radiation to the left lower quadrant. Patient reports onset of symptoms since Saturday which has become progressively more symptomatic over the past few days.  He has prior history of kidney stone and tubulovillous adenoma.  He denies dysuria, hematuria, fevers or chills, diarrhea or constipation. Patient however stated that he had nausea and vomiting x 1 on Sunday. On arrival to the ED, he was afebrile with blood pressure 161/45mm Hg and pulse rate 65 beats/min. There were no focal neurological deficits; he was alert and oriented x4.  Initial labs revealed a creatinine level of 1.63, which was increased from his baseline of 1.24 from 7 days ago.  CBC unremarkable.  UA negative for UTI.  Renal ultrasound and CT abdomen and pelvis findings shows 8 x 4 mm left UPJ calculus causing mild hydronephrosis with right renal cyst consistent with ureteropelvic junction (UPJ)obstruction.  Due to continued pain after multiple doses of morphine patient was admitted to the hospitalist service for further management. Objective/Medical story Patient needs admission for aggressive pain management secondary to kidney stone. Needs urology evaluation and further intervention like lithotripsy. Goals of care determination:  Advance care directives goals of care treatment plan discussed Patient wants everything done for now which includes CPR, intubation ventilator if the need arises CODE STATUS: Full code Time spent discussing advanced care planning: 16 minutes

## 2018-08-19 NOTE — ED Notes (Signed)
Report to [place], rn.

## 2018-08-19 NOTE — TOC Initial Note (Signed)
Transition of Care [facility]) - Initial/Assessment Note    Patient Details  Name: [facility] MRN: [id number] Date of Birth: May 10, 1945  Transition of [facility] CM/SW Contact:    Shelbie Hutching, RN Phone Number: 08/19/2018, 2:11 PM  Clinical Narrative:                 Patient placed under observation for left flank pain from kidney stone.  Patient is from home and is independent.  Patient reports that he still works full time for [employer] and still carries insurance with them.  Patient has only Medicare part A and not B.  No discharge needs identified.   Expected Discharge Plan: Home/Self Care Barriers to Discharge: Continued Medical Work up   Patient Goals and CMS Choice        Expected Discharge Plan and Services Expected Discharge Plan: Home/Self Care       Living arrangements for the past 2 months: Single Family Home                                      Prior Living Arrangements/Services Living arrangements for the past 2 months: Single Family Home Lives with:: Spouse Patient language and need for interpreter reviewed:: No Do you feel safe going back to the place where you live?: Yes      Need for Family Participation in Patient Care: No (Comment) Care giver support system in place?: No (comment)   Criminal Activity/Legal Involvement Pertinent to Current Situation/Hospitalization: No - Comment as needed  Activities of Daily Living Home Assistive Devices/Equipment: None ADL Screening (condition at time of admission) Patient's cognitive ability adequate to safely complete daily activities?: Yes Is the patient deaf or have difficulty hearing?: No Does the patient have difficulty seeing, even when wearing glasses/contacts?: No Does the patient have difficulty concentrating, remembering, or making decisions?: No Patient able to express need for assistance with ADLs?: Yes Does the patient have difficulty dressing or bathing?: No Independently performs ADLs?: Yes  (appropriate for developmental age) Does the patient have difficulty walking or climbing stairs?: No Weakness of Legs: None Weakness of Arms/Hands: None  Permission Sought/Granted                  Emotional Assessment Appearance:: Appears stated age Attitude/Demeanor/Rapport: Engaged Affect (typically observed): Accepting Orientation: : Oriented to Self, Oriented to Place, Oriented to  Time, Oriented to Situation Alcohol / Substance Use: Not Applicable Psych Involvement: No (comment)  Admission diagnosis:  Ureterolithiasis [N20.1] Patient Active Problem List   Diagnosis Date Noted  . Renal stone 08/19/2018  . Cerumen impaction 08/12/2018  . Advanced care planning/counseling discussion 01/29/2017  . Benign prostatic hyperplasia with urinary obstruction 11/24/2014  . Contact dermatitis 11/24/2014  . Actinic keratosis 11/24/2014  . Cold sore 11/24/2014  . Essential hypertension   . Body mass index 35.0-35.9, adult    PCP:  Guadalupe Maple, MD Pharmacy:   [id number] - [place], [address] [place] [address] Phone: [phone] Fax: [phone]     Social Determinants of Health (SDOH) Interventions    Readmission Risk Interventions No flowsheet data found.

## 2018-08-19 NOTE — ED Notes (Signed)
1st attempt to call report, floor unable to take report at this time.

## 2018-08-19 NOTE — Consult Note (Signed)
08/19/18 9:52 AM   Versie Starks 04-Dec-1945 [id number]  CC: Left flank pain  HPI: I was asked to see Mr. Mannella in consultation for left-sided flank pain from Dr. Corky Downs.  He is a healthy 73 year old male who presents to the emergency department with 48 hours of sharp and colicky left-sided flank pain that radiates to the left groin.  He also has had some nausea and one episode of emesis.  There are no aggravating or alleviating factors.  CT demonstrated a 8 mm left UPJ stone with mild upstream hydronephrosis.  There were no other renal stones.  CT was also notable for a large 20 cm simple right renal cyst.  There is no right-sided hydronephrosis.  He denies any fevers, chills, gross hematuria, dysuria, or other urinary symptoms.  He takes Flomax at baseline for BPH which is well controlled.  He has never required any surgical intervention for stones before, and passed a stone spontaneously around 20 years ago.  Currently, his pain is very well controlled and he appears very comfortable.  He had cereal for breakfast this morning at 6 AM.   PMH: Past Medical History:  Diagnosis Date  . Body mass index 35.0-35.9, adult   . Cold sore     Surgical History: Past Surgical History:  Procedure Laterality Date  . COLON SURGERY    . COLONOSCOPY    . COLONOSCOPY WITH PROPOFOL N/A 12/10/2014   Procedure: COLONOSCOPY WITH PROPOFOL;  Surgeon: Lucilla Lame, MD;  Location: [facility];  Service: Endoscopy;  Laterality: N/A;  . HERNIA REPAIR      Allergies: No Known Allergies  Family History: Family History  Problem Relation Age of Onset  . Heart disease Mother 82  . Heart disease Father 1  . Diabetes Sister   . Stroke Sister   . Cancer Neg Hx   . COPD Neg Hx     Social History:  reports that he has never smoked. He has never used smokeless tobacco. He reports that he does not drink alcohol or use drugs.  ROS: Please see flowsheet from today's date for complete review of  systems.  Physical Exam: BP 137/78   Pulse 66   Temp 98.2 F (36.8 C) (Oral)   Resp 17   Ht 5\' 10"  (1.778 m)   Wt 102.1 kg   SpO2 94%   BMI 32.28 kg/m    Constitutional:  Alert and oriented, No acute distress.  Appears comfortable Cardiovascular: Regular rate and rhythm Respiratory: Clear to auscultation bilaterally GI: Abdomen is soft, nontender, nondistended, no abdominal masses GU: Left CVA tenderness Lymph: No cervical or inguinal lymphadenopathy. Skin: No rashes, bruises or suspicious lesions. Neurologic: Grossly intact, no focal deficits, moving all 4 extremities. Psychiatric: Normal mood and affect.  Laboratory Data: Reviewed WBC 9.9 Creatinine 1.63 Urinalysis with no bacteria, greater than 50 RBCs, 20-50 WBCs, nitrite negative, no leukocytes  Pertinent Imaging: I have personally reviewed the CT stone protocol.  8 mm left UPJ stone with mild upstream hydronephrosis. 750HU, clearly seen on scout CT, 14cm SSD.  No other renal stones.  Right 20 cm large simple renal cyst with no hydronephrosis.  Assessment & Plan:   In summary, Logan Willis is a 73 year old healthy male with 48 hours of left-sided flank pain secondary to an 8 mm left UPJ stone with mild upstream hydronephrosis.  He has no clinical or laboratory signs of infection, and his pain and nausea are currently well controlled in the emergency department.  We  discussed various treatment options for urolithiasis including observation with or without medical expulsive therapy, shockwave lithotripsy (SWL), ureteroscopy and laser lithotripsy with stent placement, and percutaneous nephrolithotomy.  We discussed that management is based on stone size, location, density, patient co-morbidities, and patient preference.   Stones <81mm in size have a >80% spontaneous passage rate. Data surrounding the use of tamsulosin for medical expulsive therapy is controversial, but meta analyses suggests it is most efficacious for distal  stones between 5-56mm in size. Possible side effects include dizziness/lightheadedness, and retrograde ejaculation.  SWL has a lower stone free rate in a single procedure, but also a lower complication rate compared to ureteroscopy and avoids a stent and associated stent related symptoms. Possible complications include renal hematoma, steinstrasse, and need for additional treatment.  Ureteroscopy with laser lithotripsy and stent placement has a higher stone free rate than SWL in a single procedure, however increased complication rate including possible infection, ureteral injury, bleeding, and stent related morbidity. Common stent related symptoms include dysuria, urgency/frequency, and flank pain.  After an extensive discussion of the risks and benefits of the above treatment options, the patient would like to proceed with left shockwave lithotripsy on Thursday 8/13.  We discussed return precautions including uncontrolled pain or fever over 101.3.  Okay for discharge today with oral Dilaudid and Zofran No NSAIDs or aspirin in anticipation of shockwave lithotripsy Thursday Will complete surgical paperwork today prior to discharge Continue Flomax and strain urine Call if questions  [place] [employer], [employer] [address], [place], [place] [address] [phone]

## 2018-08-19 NOTE — ED Provider Notes (Signed)
[facility] Emergency Department Provider Note   First MD Initiated Contact with Patient 08/19/18 [phone]     (approximate)  I have reviewed the triage vital signs and the nursing notes.   HISTORY  Chief Complaint Flank Pain    HPI Logan Willis is a 73 y.o. male with below list of previous medical conditions including kidney stones presents to the emergency department secondary to 8 out of 10 left flank pain with radiation to the left lower quadrant which patient states is been occurring since Saturday.  Patient denies any dysuria no hematuria.  Patient denies any fever.  Patient denies any nausea or vomiting.  Patient denies any constipation or diarrhea.        Past Medical History:  Diagnosis Date  . Body mass index 35.0-35.9, adult   . Cold sore     Patient Active Problem List   Diagnosis Date Noted  . Cerumen impaction 08/12/2018  . Advanced care planning/counseling discussion 01/29/2017  . Benign prostatic hyperplasia with urinary obstruction 11/24/2014  . Contact dermatitis 11/24/2014  . Actinic keratosis 11/24/2014  . Cold sore 11/24/2014  . Essential hypertension   . Body mass index 35.0-35.9, adult     Past Surgical History:  Procedure Laterality Date  . COLON SURGERY    . COLONOSCOPY    . COLONOSCOPY WITH PROPOFOL N/A 12/10/2014   Procedure: COLONOSCOPY WITH PROPOFOL;  Surgeon: Lucilla Lame, MD;  Location: [facility];  Service: Endoscopy;  Laterality: N/A;  . HERNIA REPAIR      Prior to Admission medications   Medication Sig Start Date End Date Taking? Authorizing Provider  benazepril (LOTENSIN) 20 MG tablet Take 1 tablet (20 mg total) by mouth daily. 02/05/18   Guadalupe Maple, MD  fexofenadine-pseudoephedrine (ALLEGRA-D 24) 180-240 MG 24 hr tablet Take 1 tablet by mouth daily as needed.    [provider]  tamsulosin (FLOMAX) 0.4 MG CAPS capsule Take 1 capsule (0.4 mg total) by mouth daily. 02/05/18   Guadalupe Maple, MD  triamcinolone cream (KENALOG) 0.1 % Apply 1 application topically 2 (two) times daily. 08/06/18   Guadalupe Maple, MD  valACYclovir (VALTREX) 500 MG tablet Take 2 tablets (1,000 mg total) by mouth 2 (two) times daily. 02/05/18   Guadalupe Maple, MD    Allergies Patient has no known allergies.  Family History  Problem Relation Age of Onset  . Heart disease Mother 20  . Heart disease Father 42  . Diabetes Sister   . Stroke Sister   . Cancer Neg Hx   . COPD Neg Hx     Social History Social History   Tobacco Use  . Smoking status: Never Smoker  . Smokeless tobacco: Never Used  Substance Use Topics  . Alcohol use: No  . Drug use: No    Review of Systems Constitutional: No fever/chills Eyes: No visual changes. ENT: No sore throat. Cardiovascular: Denies chest pain. Respiratory: Denies shortness of breath. Gastrointestinal: Positive for left flank pain.  No nausea, no vomiting.  No diarrhea.  No constipation. Genitourinary: Negative for dysuria. Musculoskeletal: Negative for neck pain.  Negative for back pain. Integumentary: Negative for rash. Neurological: Negative for headaches, focal weakness or numbness.  ____________________________________________   PHYSICAL EXAM:  VITAL SIGNS: ED Triage Vitals  Enc Vitals Group     BP 08/19/18 0528 (!) 161/81     Pulse Rate 08/19/18 0528 (!) 58     Resp 08/19/18 0528 18  Temp 08/19/18 0528 98.2 F (36.8 C)     Temp Source 08/19/18 0528 Oral     SpO2 08/19/18 0528 97 %     Weight 08/19/18 0522 102.1 kg (225 lb)     Height 08/19/18 0522 1.778 m (5\' 10" )     Head Circumference --      Peak Flow --      Pain Score 08/19/18 0522 8     Pain Loc --      Pain Edu? --      Excl. in [place]? --     Constitutional: Alert and oriented.  Apparent discomfort Eyes: Conjunctivae are normal.  Mouth/Throat: Mucous membranes are moist. Neck: No stridor.  No meningeal signs.   Cardiovascular: Normal rate, regular rhythm.  Good peripheral circulation. Grossly normal heart sounds. Respiratory: Normal respiratory effort.  No retractions. Gastrointestinal: Soft and nontender. No distention.  Musculoskeletal: No lower extremity tenderness nor edema. No gross deformities of extremities. Neurologic:  Normal speech and language. No gross focal neurologic deficits are appreciated.  Skin:  Skin is warm, dry and intact. Psychiatric: Mood and affect are normal. Speech and behavior are normal.  ____________________________________________   LABS (all labs ordered are listed, but only abnormal results are displayed)  Labs Reviewed  URINALYSIS, COMPLETE (UACMP) WITH MICROSCOPIC - Abnormal; Notable for the following components:      Result Value   Color, Urine AMBER (*)    APPearance HAZY (*)    Hgb urine dipstick LARGE (*)    Ketones, ur 5 (*)    Protein, ur 100 (*)    RBC / HPF >50 (*)    All other components within normal limits  COMPREHENSIVE METABOLIC PANEL - Abnormal; Notable for the following components:   Glucose, Bld 113 (*)    Creatinine, Ser 1.63 (*)    Total Bilirubin 1.3 (*)    GFR calc non Af Amer 42 (*)    GFR calc Af Amer 48 (*)    All other components within normal limits  CBC   ___________________________  RADIOLOGY I, [place] N BROWN, personally viewed and evaluated these images (plain radiographs) as part of my medical decision making, as well as reviewing the written report by the radiologist.  ED MD interpretation: 8 x 4 mm left UPJ calculus with mild hydronephrosis.  Right renal cyst measuring 21 cm.  Official radiology report(s): Ct Renal Stone Study  Result Date: 08/19/2018 CLINICAL DATA:  Sudden onset left flank pain EXAM: CT ABDOMEN AND PELVIS WITHOUT CONTRAST TECHNIQUE: Multidetector CT imaging of the abdomen and pelvis was performed following the standard protocol without IV contrast. COMPARISON:  08/04/2015 FINDINGS: Lower chest:  No contributory findings. Hepatobiliary: No  focal liver abnormality.No evidence of biliary obstruction or stone. Pancreas: Unremarkable. Spleen: Unremarkable. Adrenals/Urinary Tract: Negative adrenals. Irregularly-shaped stone at the left UPJ measuring 8 x 4 mm, with mild hydronephrosis and perinephric stranding.Large right renal cysts measuring up to 21 cm at the right lower kidney where there is renal parenchymal distortion. Stomach/Bowel: No obstruction. Appendectomy. Mild left colonic diverticulosis. Vascular/Lymphatic: No acute vascular abnormality. No mass or adenopathy. Reproductive:Mild symmetric prostate enlargement. Other: No ascites or pneumoperitoneum. Umbilical hernia repair using mesh Musculoskeletal: No acute abnormalities. IMPRESSION: 1. 8 x 4 mm left UPJ calculus causing mild hydronephrosis. 2. Right renal cysts measuring up to 21 cm. Electronically Signed   By: Monte Fantasia M.D.   On: 08/19/2018 06:31      Procedures   ____________________________________________   INITIAL IMPRESSION / MDM /  ASSESSMENT AND PLAN / ED COURSE  As part of my medical decision making, I reviewed the following data within the electronic MEDICAL RECORD NUMBER  73 year old male presenting with above-stated history and physical exam consistent with left ureterolithiasis which was confirmed on CT which revealed a 8 x 4 mm left UPJ calculus with mild hydronephrosis.  Incidental finding of a right renal cyst measuring 21 cm was also noted.  Patient was informed of all clinical findings including the cyst.  Patient given IV morphine 4 mg and minimal improvement of pain and as such an additional 4 mg was administered.  Patient also given IV Zofran 4 mg.     ____________________________________________  FINAL CLINICAL IMPRESSION(S) / ED DIAGNOSES  Final diagnoses:  Ureterolithiasis     MEDICATIONS GIVEN DURING THIS VISIT:  Medications  morphine 4 MG/ML injection 4 mg (4 mg Intravenous Given 08/19/18 0549)  ondansetron (ZOFRAN) injection 4 mg (4  mg Intravenous Given 08/19/18 0549)  iohexol (OMNIPAQUE) 300 MG/ML solution 100 mL (100 mLs Intravenous Contrast Given 08/19/18 0608)     ED Discharge Orders    None      *Please note:  DONYEA BEVERLIN was evaluated in Emergency Department on 08/19/2018 for the symptoms described in the history of present illness. He was evaluated in the context of the global COVID-19 pandemic, which necessitated consideration that the patient might be at risk for infection with the SARS-CoV-2 virus that causes COVID-19. Institutional protocols and algorithms that pertain to the evaluation of patients at risk for COVID-19 are in a state of rapid change based on information released by regulatory bodies including the CDC and federal and state organizations. These policies and algorithms were followed during the patient's care in the ED.  Some ED evaluations and interventions may be delayed as a result of limited staffing during the pandemic.*  Note:  This document was prepared using Dragon voice recognition software and may include unintentional dictation errors.   Gregor Hams, MD 08/19/18 [phone]

## 2018-08-20 ENCOUNTER — Telehealth: Payer: Self-pay | Admitting: Radiology

## 2018-08-20 ENCOUNTER — Other Ambulatory Visit: Payer: Self-pay | Admitting: Radiology

## 2018-08-20 DIAGNOSIS — N201 Calculus of ureter: Secondary | ICD-10-CM

## 2018-08-20 LAB — CBC
HCT: 40.7 % (ref 39.0–52.0)
Hemoglobin: 13.4 g/dL (ref 13.0–17.0)
MCH: 29.5 pg (ref 26.0–34.0)
MCHC: 32.9 g/dL (ref 30.0–36.0)
MCV: 89.6 fL (ref 80.0–100.0)
Platelets: 118 10*3/uL — ABNORMAL LOW (ref 150–400)
RBC: 4.54 MIL/uL (ref 4.22–5.81)
RDW: 14 % (ref 11.5–15.5)
WBC: 8.2 10*3/uL (ref 4.0–10.5)
nRBC: 0 % (ref 0.0–0.2)

## 2018-08-20 LAB — BASIC METABOLIC PANEL
Anion gap: 7 (ref 5–15)
BUN: 27 mg/dL — ABNORMAL HIGH (ref 8–23)
CO2: 27 mmol/L (ref 22–32)
Calcium: 8.6 mg/dL — ABNORMAL LOW (ref 8.9–10.3)
Chloride: 105 mmol/L (ref 98–111)
Creatinine, Ser: 1.94 mg/dL — ABNORMAL HIGH (ref 0.61–1.24)
GFR calc Af Amer: 39 mL/min — ABNORMAL LOW (ref >60)
GFR calc non Af Amer: 34 mL/min — ABNORMAL LOW (ref >60)
Glucose, Bld: 110 mg/dL — ABNORMAL HIGH (ref 70–99)
Potassium: 4.3 mmol/L (ref 3.5–5.1)
Sodium: 139 mmol/L (ref 135–145)

## 2018-08-20 MED ORDER — OXYCODONE-ACETAMINOPHEN 5-325 MG PO TABS: 1.0000 | ORAL_TABLET | ORAL | 0 refills | Status: DC | PRN

## 2018-08-20 MED ORDER — DULCOLAX 5 MG PO TBEC: 5.0000 mg | DELAYED_RELEASE_TABLET | Freq: Every day | ORAL | 1 refills | Status: DC | PRN

## 2018-08-20 MED ORDER — ALUM & MAG HYDROXIDE-SIMETH 200-200-20 MG/5ML PO SUSP
30.0000 mL | ORAL | Status: DC | PRN
  Administered 2018-08-20: 05:00:00 30 mL via ORAL
  Filled 2018-08-20: qty 30

## 2018-08-20 NOTE — Progress Notes (Signed)
Pt being discharged home, discharge instructions and prescriptions reviewed with pt, states understanding, pt with no complaints 

## 2018-08-20 NOTE — Progress Notes (Signed)
Urology Inpatient Progress Note  Subjective: Logan Willis is a 73 y.o. male who presented to the ED on 08/19/2018 with complaints of 48 hours of left flank pain radiating to the abdomen.  CT stone study with 8 mm left UPJ calculus with mild hydronephrosis, noninfected.  Also with a >20 cm right renal simple cyst.  Today patient reports resolution of left flank pain.  He was up ambulating in his room at the time of my visit.  He states he continues to have some periumbilical pain.  He believes this to be associated with constipation and says that he took a Maalox yesterday with some symptom palliation.  He has no questions or concerns at this time.  Anti-infectives: Anti-infectives (From admission, onward)   None      Current Facility-Administered Medications  Medication Dose Route Frequency Provider Last Rate Last Dose  . acetaminophen (TYLENOL) tablet 650 mg  650 mg Oral Q6H PRN Saundra Shelling, MD       Or  . acetaminophen (TYLENOL) suppository 650 mg  650 mg Rectal Q6H PRN Pyreddy, Reatha Harps, MD      . alum & mag hydroxide-simeth (MAALOX/MYLANTA) 200-200-20 MG/5ML suspension 30 mL  30 mL Oral Q4H PRN Saundra Shelling, MD   30 mL at 08/20/18 0524  . enoxaparin (LOVENOX) injection 40 mg  40 mg Subcutaneous Q24H Saundra Shelling, MD   40 mg at 08/19/18 2008  . hydrALAZINE (APRESOLINE) injection 10 mg  10 mg Intravenous Q6H PRN Lang Snow, NP      . HYDROcodone-acetaminophen (NORCO/VICODIN) 5-325 MG per tablet 1-2 tablet  1-2 tablet Oral Q4H PRN Saundra Shelling, MD   1 tablet at 08/19/18 2317  . HYDROmorphone (DILAUDID) injection 1 mg  1 mg Intravenous Q4H PRN Lang Snow, NP   1 mg at 08/19/18 0921  . ondansetron (ZOFRAN) tablet 4 mg  4 mg Oral Q6H PRN Saundra Shelling, MD       Or  . ondansetron (ZOFRAN) injection 4 mg  4 mg Intravenous Q6H PRN Pyreddy, Pavan, MD      . senna-docusate (Senokot-S) tablet 1 tablet  1 tablet Oral QHS PRN Saundra Shelling, MD      . tamsulosin  (FLOMAX) capsule 0.4 mg  0.4 mg Oral Daily Pyreddy, Pavan, MD   0.4 mg at 08/20/18 0716  . triamcinolone cream (KENALOG) 0.1 % 1 application  1 application Topical BID Pyreddy, Pavan, MD         Objective: Vital signs in last 24 hours: Temp:  [97.8 F (36.6 C)-98.7 F (37.1 C)] 97.9 F (36.6 C) (08/11 0440) Pulse Rate:  [57-62] 57 (08/11 0440) Resp:  [16-20] 18 (08/11 0440) BP: (105-133)/(64-73) 105/66 (08/11 0440) SpO2:  [95 %-98 %] 98 % (08/11 0440)  Intake/Output from previous day: 08/10 0701 - 08/11 0700 In: 1638.5 [P.O.:240; I.V.:1398.5] Out: 250 [Urine:250] Intake/Output this shift: No intake/output data recorded.  Physical Exam Vitals signs reviewed.  Constitutional:      General: He is not in acute distress.    Appearance: He is not ill-appearing, toxic-appearing or diaphoretic.  HENT:     Head: Normocephalic and atraumatic.  Pulmonary:     Effort: Pulmonary effort is normal. No respiratory distress.  Abdominal:     General: Abdomen is protuberant.  Neurological:     Mental Status: He is alert and oriented to person, place, and time.  Psychiatric:        Mood and Affect: Mood normal.  Behavior: Behavior normal.    Lab Results:  Recent Labs    08/19/18 0530 08/20/18 0627  WBC 9.9 8.2  HGB 15.1 13.4  HCT 44.5 40.7  PLT 159 118*   BMET Recent Labs    08/19/18 0530 08/20/18 0627  NA 140 139  K 4.3 4.3  CL 108 105  CO2 23 27  GLUCOSE 113* 110*  BUN 21 27*  CREATININE 1.63* 1.94*  CALCIUM 9.1 8.6*   Studies/Results: Ct Renal Stone Study  Result Date: 08/19/2018 CLINICAL DATA:  Sudden onset left flank pain EXAM: CT ABDOMEN AND PELVIS WITHOUT CONTRAST TECHNIQUE: Multidetector CT imaging of the abdomen and pelvis was performed following the standard protocol without IV contrast. COMPARISON:  08/04/2015 FINDINGS: Lower chest:  No contributory findings. Hepatobiliary: No focal liver abnormality.No evidence of biliary obstruction or stone.  Pancreas: Unremarkable. Spleen: Unremarkable. Adrenals/Urinary Tract: Negative adrenals. Irregularly-shaped stone at the left UPJ measuring 8 x 4 mm, with mild hydronephrosis and perinephric stranding.Large right renal cysts measuring up to 21 cm at the right lower kidney where there is renal parenchymal distortion. Stomach/Bowel: No obstruction. Appendectomy. Mild left colonic diverticulosis. Vascular/Lymphatic: No acute vascular abnormality. No mass or adenopathy. Reproductive:Mild symmetric prostate enlargement. Other: No ascites or pneumoperitoneum. Umbilical hernia repair using mesh Musculoskeletal: No acute abnormalities. IMPRESSION: 1. 8 x 4 mm left UPJ calculus causing mild hydronephrosis. 2. Right renal cysts measuring up to 21 cm. Electronically Signed   By: Monte Fantasia M.D.   On: 08/19/2018 06:31   I personally reviewed the imaging above and agree with radiologic findings.  Assessment & Plan: Patient is clinically stable with adequate pain control.  Okay to discharge at this time.  We will proceed with ESWL on an outpatient basis this Thursday, 08/22/2018.  Patient should be discharged on pain medication, a bowel regimen of 2 Dulcolax today and 1 tomorrow.  No NSAIDs at this time given procedure in 48 hours.  Our surgical scheduler, Amy, will contact him this afternoon with details regarding his procedure.  Thank you for involving me in the care of this patient, please contact me with any questions or concerns.  Debroah Loop, PA-C 08/20/2018

## 2018-08-20 NOTE — Discharge Summary (Signed)
[facility] [place] NAME: Logan Willis    MR#:  [id number]  DATE OF BIRTH:  1945-06-06  DATE OF ADMISSION:  08/19/2018 ADMITTING PHYSICIAN: Saundra Shelling, MD  DATE OF DISCHARGE: 08/20/2018 10:04 AM  PRIMARY CARE PHYSICIAN: Guadalupe Maple, MD   ADMISSION DIAGNOSIS:  Ureterolithiasis [N20.1]  DISCHARGE DIAGNOSIS:  Active Problems:   Renal stone Flank pain secondary to renal stone Acute kidney injury  SECONDARY DIAGNOSIS:   Past Medical History:  Diagnosis Date  . Body mass index 35.0-35.9, adult   . Cold sore      ADMITTING HISTORY 73 year old male with past medical history of benign prostatic hyperplasia with urinary obstruction, kidney stones, and essential hypertension presenting to the ED with complaints of left flank pain with radiation to the left lower quadrant. Patient reports onset of symptoms since Saturday which has become progressively more symptomatic over the past few days.  He has prior history of kidney stone and tubulovillous adenoma.  He denies dysuria, hematuria, fevers or chills, diarrhea or constipation. Patient however stated that he had nausea and vomiting x 1 on Sunday. On arrival to the ED, he was afebrile with blood pressure 161/16mm Hg and pulse rate 65 beats/min. There were no focal neurological deficits; he was alert and oriented x4.  Initial labs revealed a creatinine level of 1.63, which was increased from his baseline of 1.24 from 7 days ago.  CBC unremarkable.  UA negative for UTI.  Renal ultrasound and CT abdomen and pelvis findings shows 8 x 4 mm left UPJ calculus causing mild hydronephrosis with right renal cyst consistent with ureteropelvic junction (UPJ)obstruction.  Due to continued pain after multiple doses of morphine patient was admitted to the hospitalist service for further management.   HOSPITAL COURSE:  Patient admitted to medical floor.  Was hydrated with IV fluids and flank pain was  controlled with IV narcotic pain medication.  Was worked up with CT kidney stone protocol which showed left ureteropelvic junction calculus 8/4 millimeter in size.  Patient was seen by urology.  Urology services have recommended lithotripsy as outpatient and has scheduled for Thursday.  Patient's pain is well controlled.  Tolerated diet well.  Will be discharged home and follow-up with urology on Thursday for lithotripsy.  CONSULTS OBTAINED:  Treatment Team:  Billey Co, MD  DRUG ALLERGIES:  No Known Allergies  DISCHARGE MEDICATIONS:   Allergies as of 08/20/2018   No Known Allergies     Medication List    STOP taking these medications   triamcinolone cream 0.1 % Commonly known as: KENALOG     TAKE these medications   benazepril 20 MG tablet Commonly known as: LOTENSIN Take 1 tablet (20 mg total) by mouth daily.   Dulcolax 5 MG EC tablet Generic drug: bisacodyl Take 1 tablet (5 mg total) by mouth daily as needed for moderate constipation.   fexofenadine-pseudoephedrine 180-240 MG 24 hr tablet Commonly known as: ALLEGRA-D 24 Take 1 tablet by mouth daily as needed.   oxyCODONE-acetaminophen 5-325 MG tablet Commonly known as: Percocet Take 1 tablet by mouth every 4 (four) hours as needed for moderate pain or severe pain.   tamsulosin 0.4 MG Caps capsule Commonly known as: FLOMAX Take 1 capsule (0.4 mg total) by mouth daily.   valACYclovir 500 MG tablet Commonly known as: Valtrex Take 2 tablets (1,000 mg total) by mouth 2 (two) times daily.       Today  Patient seen today No complaints  of flank pain No fever Tolerating diet well Hemodynamically stable VITAL SIGNS:  Blood pressure 105/66, pulse (!) 57, temperature 97.9 F (36.6 C), resp. rate 18, height 5\' 10"  (1.778 m), weight 102.1 kg, SpO2 98 %.  I/O:    Intake/Output Summary (Last 24 hours) at 08/20/2018 1106 Last data filed at 08/20/2018 0900 Gross per 24 hour  Intake 1742.38 ml  Output 250 ml   Net 1492.38 ml    PHYSICAL EXAMINATION:  Physical Exam  GENERAL:  73 y.o.-year-old patient lying in the bed with no acute distress.  LUNGS: Normal breath sounds bilaterally, no wheezing, rales,rhonchi or crepitation. No use of accessory muscles of respiration.  CARDIOVASCULAR: S1, S2 normal. No murmurs, rubs, or gallops.  ABDOMEN: Soft, non-tender, non-distended. Bowel sounds present. No organomegaly or mass.  NEUROLOGIC: Moves all 4 extremities. PSYCHIATRIC: The patient is alert and oriented x 3.  SKIN: No obvious rash, lesion, or ulcer.   DATA REVIEW:   CBC Recent Labs  Lab 08/20/18 0627  WBC 8.2  HGB 13.4  HCT 40.7  PLT 118*    Chemistries  Recent Labs  Lab 08/19/18 0530 08/20/18 0627  NA 140 139  K 4.3 4.3  CL 108 105  CO2 23 27  GLUCOSE 113* 110*  BUN 21 27*  CREATININE 1.63* 1.94*  CALCIUM 9.1 8.6*  AST 18  --   ALT 22  --   ALKPHOS 48  --   BILITOT 1.3*  --     Cardiac Enzymes No results for input(s): TROPONINI in the last 168 hours.  Microbiology Results  Results for orders placed or performed during the hospital encounter of 08/19/18  SARS CORONAVIRUS 2 Nasal Swab Aptima Multi Swab     Status: None   Collection Time: 08/19/18  7:41 AM   Specimen: Aptima Multi Swab; Nasal Swab  Result Value Ref Range Status   SARS Coronavirus 2 NEGATIVE NEGATIVE Final    Comment: (NOTE) SARS-CoV-2 target nucleic acids are NOT DETECTED. The SARS-CoV-2 RNA is generally detectable in upper and lower respiratory specimens during the acute phase of infection. Negative results do not preclude SARS-CoV-2 infection, do not rule out co-infections with other pathogens, and should not be used as the sole basis for treatment or other patient management decisions. Negative results must be combined with clinical observations, patient history, and epidemiological information. The expected result is Negative. Fact Sheet for  Patients: SugarRoll.be Fact Sheet for Healthcare Providers: https://www.woods-mathews.com/ This test is not yet approved or cleared by the [place] FDA and  has been authorized for detection and/or diagnosis of SARS-CoV-2 by FDA under an Emergency Use Authorization (EUA). This EUA will remain  in effect (meaning this test can be used) for the duration of the COVID-19 declaration under Section 56 4(b)(1) of the Act, 21 U.S.C. section 360bbb-3(b)(1), unless the authorization is terminated or revoked sooner. Performed at [facility], [place] [address], [place], [place] [address]     RADIOLOGY:  Ct Abdomen Pelvis W Contrast  Result Date: 08/19/2018 CLINICAL DATA:  Flank pain with stone disease suspected EXAM: CT ABDOMEN AND PELVIS WITH CONTRAST TECHNIQUE: Multidetector CT imaging of the abdomen and pelvis was performed using the standard protocol following bolus administration of intravenous contrast. CONTRAST:  184mL OMNIPAQUE IOHEXOL 300 MG/ML  SOLN COMPARISON:  Noncontrast CT earlier the same day FINDINGS: Lower chest:  No contributory findings. Hepatobiliary: No focal liver abnormality.No evidence of biliary obstruction or stone. Pancreas: Unremarkable. Spleen: Unremarkable. Adrenals/Urinary Tract: Negative adrenals. 9  x 4 mm stone at the left UPJ with irregular shape. There is left hydronephrosis and delayed renal excretion . Right renal cysts including a 21 cm simple cyst projecting anteriorly and inferiorly from the kidney with parenchymal distortion. Pelviectasis on the right without hydronephrosis. Stomach/Bowel: No obstruction. Appendectomy. Mild left colonic diverticulosis. Vascular/Lymphatic: No acute vascular abnormality. No mass or adenopathy. Reproductive:Symmetric enlargement of the prostate gland. Other: No ascites or pneumoperitoneum. Umbilical hernia repair using mesh. Musculoskeletal: No acute abnormalities. Spondylosis with mild  lumbar dextrocurvature. IMPRESSION: 1. Obstructing 9 x 4 mm left UPJ calculus. 2. Right renal cysts measuring up to 21 cm. Electronically Signed   By: Monte Fantasia M.D.   On: 08/19/2018 06:53   Ct Renal Stone Study  Result Date: 08/19/2018 CLINICAL DATA:  Sudden onset left flank pain EXAM: CT ABDOMEN AND PELVIS WITHOUT CONTRAST TECHNIQUE: Multidetector CT imaging of the abdomen and pelvis was performed following the standard protocol without IV contrast. COMPARISON:  08/04/2015 FINDINGS: Lower chest:  No contributory findings. Hepatobiliary: No focal liver abnormality.No evidence of biliary obstruction or stone. Pancreas: Unremarkable. Spleen: Unremarkable. Adrenals/Urinary Tract: Negative adrenals. Irregularly-shaped stone at the left UPJ measuring 8 x 4 mm, with mild hydronephrosis and perinephric stranding.Large right renal cysts measuring up to 21 cm at the right lower kidney where there is renal parenchymal distortion. Stomach/Bowel: No obstruction. Appendectomy. Mild left colonic diverticulosis. Vascular/Lymphatic: No acute vascular abnormality. No mass or adenopathy. Reproductive:Mild symmetric prostate enlargement. Other: No ascites or pneumoperitoneum. Umbilical hernia repair using mesh Musculoskeletal: No acute abnormalities. IMPRESSION: 1. 8 x 4 mm left UPJ calculus causing mild hydronephrosis. 2. Right renal cysts measuring up to 21 cm. Electronically Signed   By: Monte Fantasia M.D.   On: 08/19/2018 06:31    Follow up with PCP in 1 week.  Management plans discussed with the patient, family and they are in agreement.  CODE STATUS: Full code    Code Status Orders  (From admission, onward)         Start     Ordered   08/19/18 0908  Full code  Continuous     08/19/18 0907        Code Status History    This patient has a current code status but no historical code status.   Advance Care Planning Activity      TOTAL TIME TAKING CARE OF THIS PATIENT ON DAY OF DISCHARGE: more  than 35 minutes.   Saundra Shelling M.D on 08/20/2018 at 11:06 AM  Between 7am to 6pm - Pager - [phone]  After 6pm go to [email or web address] - password [email or web address] [facility]  Office  [phone]  CC: Primary care physician; Guadalupe Maple, MD  Note: This dictation was prepared with Dragon dictation along with smaller phrase technology. Any transcriptional errors that result from this process are unintentional.

## 2018-08-20 NOTE — Telephone Encounter (Signed)
Notified patient of extracorporeal shockwave lithotripsy scheduled on 08/22/2018 at 7:45. Patient should arrive at that time to Same Day Surgery. Pre-op & discharge instructions for lithotripsy were provided to patient.    Advised patient to hold NSAIDS & aspirin products for 3 days prior to the procedure beginning on 08/20/2018.  Patient's questions were answered & he expresses understanding of these instructions.    Ranell Patrick, RN

## 2018-08-22 ENCOUNTER — Encounter
Admission: RE | Disposition: A | Discharge status: Home / Self Care | Payer: Self-pay | Source: Home / Self Care | Attending: Urology

## 2018-08-22 ENCOUNTER — Encounter: Payer: Self-pay | Admitting: *Deleted

## 2018-08-22 ENCOUNTER — Other Ambulatory Visit: Payer: Self-pay

## 2018-08-22 ENCOUNTER — Ambulatory Visit
Admission: RE | Admit: 2018-08-22 | Discharge: 2018-08-22 | Disposition: A | Discharge status: Home / Self Care | Payer: 59 | Attending: Urology | Admitting: Urology

## 2018-08-22 ENCOUNTER — Ambulatory Visit: Payer: 59

## 2018-08-22 DIAGNOSIS — E669 Obesity, unspecified: Secondary | ICD-10-CM | POA: Diagnosis not present

## 2018-08-22 DIAGNOSIS — Z6832 Body mass index (BMI) 32.0-32.9, adult: Secondary | ICD-10-CM | POA: Diagnosis not present

## 2018-08-22 DIAGNOSIS — N132 Hydronephrosis with renal and ureteral calculous obstruction: Secondary | ICD-10-CM | POA: Diagnosis not present

## 2018-08-22 DIAGNOSIS — I10 Essential (primary) hypertension: Secondary | ICD-10-CM | POA: Insufficient documentation

## 2018-08-22 DIAGNOSIS — N281 Cyst of kidney, acquired: Secondary | ICD-10-CM | POA: Insufficient documentation

## 2018-08-22 DIAGNOSIS — N201 Calculus of ureter: Secondary | ICD-10-CM

## 2018-08-22 DIAGNOSIS — N200 Calculus of kidney: Secondary | ICD-10-CM | POA: Diagnosis present

## 2018-08-22 HISTORY — PX: EXTRACORPOREAL SHOCK WAVE LITHOTRIPSY: SHX1557

## 2018-08-22 SURGERY — LITHOTRIPSY, ESWL
Anesthesia: Moderate Sedation | Laterality: Left

## 2018-08-22 MED ORDER — SENNOSIDES-DOCUSATE SODIUM 8.6-50 MG PO TABS: 2.0000 | ORAL_TABLET | Freq: Two times a day (BID) | ORAL | 1 refills | Status: AC

## 2018-08-22 MED ORDER — ONDANSETRON HCL 4 MG/2ML IJ SOLN
4.0000 mg | Freq: Once | INTRAMUSCULAR | Status: AC | PRN
  Administered 2018-08-22: 09:00:00 4 mg via INTRAVENOUS

## 2018-08-22 MED ORDER — OXYCODONE-ACETAMINOPHEN 5-325 MG PO TABS: 1.0000 | ORAL_TABLET | ORAL | 0 refills | Status: AC | PRN

## 2018-08-22 MED ORDER — CIPROFLOXACIN HCL 500 MG PO TABS
ORAL_TABLET | ORAL | Status: AC
  Administered 2018-08-22: 09:00:00 500 mg via ORAL
  Filled 2018-08-22: qty 1

## 2018-08-22 MED ORDER — DIPHENHYDRAMINE HCL 25 MG PO CAPS
25.0000 mg | ORAL_CAPSULE | ORAL | Status: AC
  Administered 2018-08-22: 09:00:00 25 mg via ORAL

## 2018-08-22 MED ORDER — ONDANSETRON HCL 4 MG/2ML IJ SOLN
INTRAMUSCULAR | Status: AC
  Administered 2018-08-22: 4 mg via INTRAVENOUS
  Filled 2018-08-22: qty 2

## 2018-08-22 MED ORDER — DIAZEPAM 5 MG PO TABS
ORAL_TABLET | ORAL | Status: AC
  Administered 2018-08-22: 09:00:00 10 mg via ORAL
  Filled 2018-08-22: qty 2

## 2018-08-22 MED ORDER — DIPHENHYDRAMINE HCL 25 MG PO CAPS
ORAL_CAPSULE | ORAL | Status: AC
  Administered 2018-08-22: 25 mg via ORAL
  Filled 2018-08-22: qty 1

## 2018-08-22 MED ORDER — ONDANSETRON HCL 2 MG/ML IV SOLN: 4.0000 mg | Freq: Once | INTRAVENOUS | Status: DC | PRN

## 2018-08-22 MED ORDER — CIPROFLOXACIN HCL 500 MG PO TABS
500.0000 mg | ORAL_TABLET | ORAL | Status: AC
  Administered 2018-08-22: 09:00:00 500 mg via ORAL

## 2018-08-22 MED ORDER — DIAZEPAM 5 MG PO TABS
10.0000 mg | ORAL_TABLET | ORAL | Status: AC
  Administered 2018-08-22: 09:00:00 10 mg via ORAL

## 2018-08-22 MED ORDER — SODIUM CHLORIDE 0.9 % IV SOLN
INTRAVENOUS | Status: DC
  Administered 2018-08-22: 09:00:00 via INTRAVENOUS

## 2018-08-22 NOTE — Discharge Instructions (Signed)

## 2018-08-22 NOTE — H&P (Signed)
UROLOGY H&P UPDATE  Agree with prior H&P dated 08/19/18. Ongoing left flank pain secondary to an 38mm left UPJ stone with hydronephrosis.   Cardiac: RRR Lungs: CTA bilaterally  Laterality: LEFT Procedure: SWL  Urine: Urinalysis 08/19/18 no bacteria, >50 RBCs, 20-50 WBCs, nitrite negative  Stone clearly seen on KUB today, 61mm left proximal ureter, 750 HU, 14cm SSD.  Informed consent obtained, we specifically discussed the risks of bleeding, infection, post-operative pain, steinstrasse, obstructive fragments, and possible need for additional procedures.  Billey Co, MD 08/22/2018

## 2018-09-09 ENCOUNTER — Ambulatory Visit
Admission: RE | Admit: 2018-09-09 | Discharge: 2018-09-09 | Disposition: A | Discharge status: Home / Self Care | Payer: 59 | Source: Ambulatory Visit | Attending: Urology | Admitting: Urology

## 2018-09-09 ENCOUNTER — Encounter: Payer: Self-pay | Admitting: Urology

## 2018-09-09 ENCOUNTER — Other Ambulatory Visit: Payer: Self-pay | Admitting: Urology

## 2018-09-09 ENCOUNTER — Other Ambulatory Visit: Payer: Self-pay | Admitting: Family Medicine

## 2018-09-09 ENCOUNTER — Ambulatory Visit (INDEPENDENT_AMBULATORY_CARE_PROVIDER_SITE_OTHER): Payer: 59 | Admitting: Urology

## 2018-09-09 ENCOUNTER — Other Ambulatory Visit: Payer: Self-pay

## 2018-09-09 VITALS — BP 109/68 | HR 79 | Ht 67.0 in | Wt 236.0 lb

## 2018-09-09 DIAGNOSIS — N200 Calculus of kidney: Secondary | ICD-10-CM

## 2018-09-09 DIAGNOSIS — N281 Cyst of kidney, acquired: Secondary | ICD-10-CM

## 2018-09-09 DIAGNOSIS — N201 Calculus of ureter: Secondary | ICD-10-CM

## 2018-09-09 NOTE — Progress Notes (Signed)
   09/09/2018 3:26 PM   [name] Dec 09, 1945 [id number]  Reason for visit: Follow up left SWL  HPI: I saw Mr. Logan Willis back in clinic after follow-up of shockwave lithotripsy for an 8 mm left UPJ stone.  He had complete resolution of his pain after the procedure and has been doing well.  Follow-up KUB today shows no residual fragments.  Of note, he has a very large right 20 cm renal cyst that is completely asymptomatic.  Physical Exam: BP 109/68 (BP Location: Left Arm, Patient Position: Sitting, Cuff Size: Normal)   Pulse 79   Ht 5\' 7"  (1.702 m)   Wt 236 lb (107 kg)   BMI 36.96 kg/m    In summary, he is a 73 year old male with 1 prior episode of stones status post successful left shockwave lithotripsy for an 8 mm left UPJ stone.  He is completely asymptomatic, and there were no residual fragments on KUB today.  We discussed general stone prevention strategies including adequate hydration with goal of producing 2.5 L of urine daily, increasing citric acid intake, increasing calcium intake during high oxalate meals, minimizing animal protein, and decreasing salt intake. Information about dietary recommendations given today.   RTC as needed or if develops right-sided flank pain  A total of 15 minutes were spent face-to-face with the patient, greater than 50% was spent in patient education, counseling, and coordination of care regarding nephrolithiasis and renal cyst.   Billey Co, [place] [address], [place], [place] [address] [phone]

## 2018-09-24 ENCOUNTER — Other Ambulatory Visit: Payer: Self-pay

## 2018-09-24 ENCOUNTER — Ambulatory Visit (INDEPENDENT_AMBULATORY_CARE_PROVIDER_SITE_OTHER): Payer: 59

## 2018-09-24 DIAGNOSIS — Z23 Encounter for immunization: Secondary | ICD-10-CM | POA: Diagnosis not present

## 2019-02-12 ENCOUNTER — Encounter: Payer: Self-pay | Admitting: Nurse Practitioner

## 2019-02-12 ENCOUNTER — Other Ambulatory Visit: Payer: Self-pay

## 2019-02-12 ENCOUNTER — Ambulatory Visit (INDEPENDENT_AMBULATORY_CARE_PROVIDER_SITE_OTHER): Payer: No Typology Code available for payment source | Admitting: Nurse Practitioner

## 2019-02-12 VITALS — BP 111/71 | HR 69 | Temp 97.6°F | Ht 68.0 in | Wt 238.8 lb

## 2019-02-12 DIAGNOSIS — Z0000 Encounter for general adult medical examination without abnormal findings: Secondary | ICD-10-CM

## 2019-02-12 DIAGNOSIS — Z6835 Body mass index (BMI) 35.0-35.9, adult: Secondary | ICD-10-CM

## 2019-02-12 DIAGNOSIS — Z87442 Personal history of urinary calculi: Secondary | ICD-10-CM | POA: Insufficient documentation

## 2019-02-12 DIAGNOSIS — N401 Enlarged prostate with lower urinary tract symptoms: Secondary | ICD-10-CM

## 2019-02-12 DIAGNOSIS — I10 Essential (primary) hypertension: Secondary | ICD-10-CM

## 2019-02-12 DIAGNOSIS — N138 Other obstructive and reflux uropathy: Secondary | ICD-10-CM

## 2019-02-12 DIAGNOSIS — Z1211 Encounter for screening for malignant neoplasm of colon: Secondary | ICD-10-CM

## 2019-02-12 LAB — MICROALBUMIN, URINE WAIVED
Creatinine, Urine Waived: 50 mg/dL (ref 10–300)
Microalb, Ur Waived: 10 mg/L (ref 0–19)
Microalb/Creat Ratio: <30 mg/g (ref <30)

## 2019-02-12 MED ORDER — TAMSULOSIN HCL 0.4 MG PO CAPS: 0.4000 mg | ORAL_CAPSULE | Freq: Every day | ORAL | 4 refills | Status: DC

## 2019-02-12 MED ORDER — BENAZEPRIL HCL 20 MG PO TABS: 20.0000 mg | ORAL_TABLET | Freq: Every day | ORAL | 4 refills | Status: DC

## 2019-02-12 NOTE — Assessment & Plan Note (Signed)
Chronic, stable with minimal symptoms.  Check PSA today.  Continue current medication regimen and adjust as needed.  Return in 6 months. °

## 2019-02-12 NOTE — Patient Instructions (Signed)
We are recommending the vaccine to everyone who has not had an allergic reaction to any of the components of the vaccine. If you have specific questions about the vaccine, please bring them up with your health care provider to discuss them.   We will likely not be getting the vaccine in the office for the first rounds of vaccinations. The way they are releasing the vaccines is going to be through the health systems (like [place], [place], [facility], [place]) or through your county health department.   The [facility] Department is giving vaccines to those 75+ starting 01/15/19  M-F 7AM to 4PM [place] [address] [place], [place] in a drive through tent  If you are 65+ you can get a vaccine through [facility] by signing up for an appointment.  You can sign up by going to: [email or web address].  You can get more information by going to: [email or web address]   DASH Eating Plan DASH stands for "Dietary Approaches to Stop Hypertension." The DASH eating plan is a healthy eating plan that has been shown to reduce high blood pressure (hypertension). It may also reduce your risk for type 2 diabetes, heart disease, and stroke. The DASH eating plan may also help with weight loss. What are tips for following this plan?  General guidelines  Avoid eating more than 2,300 mg (milligrams) of salt (sodium) a day. If you have hypertension, you may need to reduce your sodium intake to 1,500 mg a day.  Limit alcohol intake to no more than 1 drink a day for nonpregnant women and 2 drinks a day for men. One drink equals 12 oz of beer, 5 oz of wine, or 1 oz of hard liquor.  Work with your health care provider to maintain a healthy body weight or to lose weight. Ask what an ideal weight is for you.  Get at least 30 minutes of exercise that causes your heart to beat faster (aerobic exercise) most days of the week. Activities may include walking, swimming,  or biking.  Work with your health care provider or diet and nutrition specialist (dietitian) to adjust your eating plan to your individual calorie needs. Reading food labels   Check food labels for the amount of sodium per serving. Choose foods with less than 5 percent of the Daily Value of sodium. Generally, foods with less than 300 mg of sodium per serving fit into this eating plan.  To find whole grains, look for the word "whole" as the first word in the ingredient list. Shopping  Buy products labeled as "low-sodium" or "no salt added."  Buy fresh foods. Avoid canned foods and premade or frozen meals. Cooking  Avoid adding salt when cooking. Use salt-free seasonings or herbs instead of table salt or sea salt. Check with your health care provider or pharmacist before using salt substitutes.  Do not fry foods. Cook foods using healthy methods such as baking, boiling, grilling, and broiling instead.  Cook with heart-healthy oils, such as olive, canola, soybean, or sunflower oil. Meal planning  Eat a balanced diet that includes: ? 5 or more servings of fruits and vegetables each day. At each meal, try to fill half of your plate with fruits and vegetables. ? Up to 6-8 servings of whole grains each day. ? Less than 6 oz of lean meat, poultry, or fish each day. A 3-oz serving of meat is about the same size as a deck of cards. One egg equals 1 oz. ?  2 servings of low-fat dairy each day. ? A serving of nuts, seeds, or beans 5 times each week. ? Heart-healthy fats. Healthy fats called Omega-3 fatty acids are found in foods such as flaxseeds and coldwater fish, like sardines, salmon, and mackerel.  Limit how much you eat of the following: ? Canned or prepackaged foods. ? Food that is high in trans fat, such as fried foods. ? Food that is high in saturated fat, such as fatty meat. ? Sweets, desserts, sugary drinks, and other foods with added sugar. ? Full-fat dairy products.  Do not salt  foods before eating.  Try to eat at least 2 vegetarian meals each week.  Eat more home-cooked food and less restaurant, buffet, and fast food.  When eating at a restaurant, ask that your food be prepared with less salt or no salt, if possible. What foods are recommended? The items listed may not be a complete list. Talk with your dietitian about what dietary choices are best for you. Grains Whole-grain or whole-wheat bread. Whole-grain or whole-wheat pasta. Brown rice. [place]. Bulgur. Whole-grain and low-sodium cereals. Pita bread. Low-fat, low-sodium crackers. Whole-wheat flour tortillas. Vegetables Fresh or frozen vegetables (raw, steamed, roasted, or grilled). Low-sodium or reduced-sodium tomato and vegetable juice. Low-sodium or reduced-sodium tomato sauce and tomato paste. Low-sodium or reduced-sodium canned vegetables. Fruits All fresh, dried, or frozen fruit. Canned fruit in natural juice (without added sugar). Meat and other protein foods Skinless chicken or [place]. Ground chicken or [place]. Pork with fat trimmed off. Fish and seafood. Egg whites. Dried beans, peas, or lentils. Unsalted nuts, nut butters, and seeds. Unsalted canned beans. Lean cuts of beef with fat trimmed off. Low-sodium, lean deli meat. Dairy Low-fat (1%) or fat-free (skim) milk. Fat-free, low-fat, or reduced-fat cheeses. Nonfat, low-sodium ricotta or cottage cheese. Low-fat or nonfat yogurt. Low-fat, low-sodium cheese. Fats and oils Soft margarine without trans fats. Vegetable oil. Low-fat, reduced-fat, or light mayonnaise and salad dressings (reduced-sodium). Canola, safflower, olive, soybean, and sunflower oils. Avocado. Seasoning and other foods Herbs. Spices. Seasoning mixes without salt. Unsalted popcorn and pretzels. Fat-free sweets. What foods are not recommended? The items listed may not be a complete list. Talk with your dietitian about what dietary choices are best for you. Grains Baked goods  made with fat, such as croissants, muffins, or some breads. Dry pasta or rice meal packs. Vegetables Creamed or fried vegetables. Vegetables in a cheese sauce. Regular canned vegetables (not low-sodium or reduced-sodium). Regular canned tomato sauce and paste (not low-sodium or reduced-sodium). Regular tomato and vegetable juice (not low-sodium or reduced-sodium). Angie Fava. Olives. Fruits Canned fruit in a light or heavy syrup. Fried fruit. Fruit in cream or butter sauce. Meat and other protein foods Fatty cuts of meat. Ribs. Fried meat. Berniece Salines. Sausage. Bologna and other processed lunch meats. Salami. Fatback. Hotdogs. Bratwurst. Salted nuts and seeds. Canned beans with added salt. Canned or smoked fish. Whole eggs or egg yolks. Chicken or [place] with skin. Dairy Whole or 2% milk, cream, and half-and-half. Whole or full-fat cream cheese. Whole-fat or sweetened yogurt. Full-fat cheese. Nondairy creamers. Whipped toppings. Processed cheese and cheese spreads. Fats and oils Butter. Stick margarine. Lard. Shortening. Ghee. Bacon fat. Tropical oils, such as coconut, palm kernel, or palm oil. Seasoning and other foods Salted popcorn and pretzels. Onion salt, garlic salt, seasoned salt, table salt, and sea salt. Worcestershire sauce. Tartar sauce. Barbecue sauce. Teriyaki sauce. Soy sauce, including reduced-sodium. Steak sauce. Canned and packaged gravies. Fish sauce. Oyster sauce. Cocktail sauce.  Horseradish that you find on the shelf. Ketchup. Mustard. Meat flavorings and tenderizers. Bouillon cubes. Hot sauce and Tabasco sauce. Premade or packaged marinades. Premade or packaged taco seasonings. Relishes. Regular salad dressings. Where to find more information:  National Heart, Lung, and [place]: https://Nayak-eaton.com/  American Heart Association: www.heart.org Summary  The DASH eating plan is a healthy eating plan that has been shown to reduce high blood pressure (hypertension). It may also reduce  your risk for type 2 diabetes, heart disease, and stroke.  With the DASH eating plan, you should limit salt (sodium) intake to 2,300 mg a day. If you have hypertension, you may need to reduce your sodium intake to 1,500 mg a day.  When on the DASH eating plan, aim to eat more fresh fruits and vegetables, whole grains, lean proteins, low-fat dairy, and heart-healthy fats.  Work with your health care provider or diet and nutrition specialist (dietitian) to adjust your eating plan to your individual calorie needs. This information is not intended to replace advice given to you by your health care provider. Make sure you discuss any questions you have with your health care provider. Document Revised: 12/08/2016 Document Reviewed: 12/20/2015 Elsevier Patient Education  2020 [employer].

## 2019-02-12 NOTE — Progress Notes (Signed)
BP 111/71   Pulse 69   Temp 97.6 F (36.4 C) (Oral)   Ht 5\' 8"  (1.727 m)   Wt 238 lb 12.8 oz (108.3 kg)   SpO2 97%   BMI 36.31 kg/m    Subjective:    Patient ID: Logan Willis, male    DOB: [birth year], 74 y.o.   MRN: [id number]  HPI: Logan Willis is a 74 y.o. male presenting on 02/12/2019 for comprehensive medical examination. Current medical complaints include:none  He currently lives with: wife Interim Problems from his last visit: no   HYPERTENSION Continues on Benazepril 20 MG daily. Hypertension status: stable  Satisfied with current treatment? yes Duration of hypertension: chronic BP monitoring frequency:  not checking BP range:  BP medication side effects:  no Medication compliance: good compliance Previous BP meds:Benazepril Aspirin: no Recurrent headaches: no Visual changes: no Palpitations: no Dyspnea: no Chest pain: no Lower extremity edema: no Dizzy/lightheaded: no  The 10-year ASCVD risk score Mikey Bussing DC Jr., et al., 2013) is: 16.9%   Values used to calculate the score:     Age: 62 years     Sex: Male     Is Non-Hispanic African American: No     Diabetic: No     Tobacco smoker: No     Systolic Blood Pressure: 99991111 mmHg     Is BP treated: Yes     HDL Cholesterol: 42 mg/dL     Total Cholesterol: 154 mg/dL  BPH Continues on Flomax daily. BPH status: stable Satisfied with current treatment?: yes Medication side effects: no Medication compliance: good compliance Duration: chronic Nocturia: 1-2x per night Urinary frequency:no Incomplete voiding: no Urgency: no Weak urinary stream: no Straining to start stream: no Dysuria: no Onset: gradual Severity: mild   Functional Status Survey: Is the patient deaf or have difficulty hearing?: No Does the patient have difficulty seeing, even when wearing glasses/contacts?: No Does the patient have difficulty concentrating, remembering, or making decisions?: No Does the patient have difficulty walking  or climbing stairs?: No Does the patient have difficulty dressing or bathing?: No Does the patient have difficulty doing errands alone such as visiting a doctor's office or shopping?: No  FALL RISK: Fall Risk  02/12/2019 02/05/2018 07/31/2017 01/29/2017 11/30/2015  Falls in the past year? 0 0 No No No  Number falls in past yr: 0 0 - - -  Injury with Fall? 0 0 - - -  Follow up Falls evaluation completed - - - -    Depression Screen Depression screen [facility] 2/9 02/12/2019 02/05/2018 07/31/2017 01/29/2017 11/30/2015  Decreased Interest 0 0 0 0 0  Down, Depressed, Hopeless 0 0 0 0 0  PHQ - 2 Score 0 0 0 0 0  Altered sleeping - 1 - - -  Tired, decreased energy - 0 - - -  Change in appetite - 0 - - -  Feeling bad or failure about yourself  - 0 - - -  Trouble concentrating - 0 - - -  Moving slowly or fidgety/restless - 0 - - -  Suicidal thoughts - 0 - - -  PHQ-9 Score - 1 - - -  Difficult doing work/chores - Not difficult at all - - -    Advanced Directives <no information>  Past Medical History:  Past Medical History:  Diagnosis Date  . Body mass index 35.0-35.9, adult   . Cold sore   . Hypertension   . Kidney stone 08/2018    Surgical History:  Past Surgical History:  Procedure Laterality Date  . COLON SURGERY    . COLONOSCOPY    . COLONOSCOPY WITH PROPOFOL N/A 12/10/2014   Procedure: COLONOSCOPY WITH PROPOFOL;  Surgeon: Lucilla Lame, MD;  Location: [facility];  Service: Endoscopy;  Laterality: N/A;  . EXTRACORPOREAL SHOCK WAVE LITHOTRIPSY Left 08/22/2018   Procedure: EXTRACORPOREAL SHOCK WAVE LITHOTRIPSY (ESWL);  Surgeon: Billey Co, MD;  Location: [facility];  Service: Urology;  Laterality: Left;  . HERNIA REPAIR      Medications:  Current Outpatient Medications on File Prior to Visit  Medication Sig  . fexofenadine-pseudoephedrine (ALLEGRA-D 24) 180-240 MG 24 hr tablet Take 1 tablet by mouth daily as needed.  . triamcinolone cream (KENALOG) 0.1 % Apply 1 application  topically 2 (two) times daily.  . valACYclovir (VALTREX) 500 MG tablet Take 2 tablets (1,000 mg total) by mouth 2 (two) times daily. (Patient not taking: Reported on 08/22/2018)   No current facility-administered medications on file prior to visit.    Allergies:  No Known Allergies  Social History:  Social History   Socioeconomic History  . Marital status: Married    Spouse name: Not on file  . Number of children: Not on file  . Years of education: Not on file  . Highest education level: Not on file  Occupational History  . Not on file  Tobacco Use  . Smoking status: Never Smoker  . Smokeless tobacco: Never Used  Substance and Sexual Activity  . Alcohol use: No  . Drug use: No  . Sexual activity: Yes    Birth control/protection: None  Other Topics Concern  . Not on file  Social History Narrative  . Not on file   Social Determinants of Health   Financial Resource Strain:   . Difficulty of Paying Living Expenses: Not on file  Food Insecurity:   . Worried About Charity fundraiser in the Last Year: Not on file  . Ran Out of Food in the Last Year: Not on file  Transportation Needs:   . Lack of Transportation (Medical): Not on file  . Lack of Transportation (Non-Medical): Not on file  Physical Activity:   . Days of Exercise per Week: Not on file  . Minutes of Exercise per Session: Not on file  Stress:   . Feeling of Stress : Not on file  Social Connections:   . Frequency of Communication with Friends and Family: Not on file  . Frequency of Social Gatherings with Friends and Family: Not on file  . Attends Religious Services: Not on file  . Active Member of Clubs or Organizations: Not on file  . Attends Archivist Meetings: Not on file  . Marital Status: Not on file  Intimate Partner Violence:   . Fear of Current or Ex-Partner: Not on file  . Emotionally Abused: Not on file  . Physically Abused: Not on file  . Sexually Abused: Not on file   Social  History   Tobacco Use  Smoking Status Never Smoker  Smokeless Tobacco Never Used   Social History   Substance and Sexual Activity  Alcohol Use No    Family History:  Family History  Problem Relation Age of Onset  . Heart disease Mother 75  . Heart disease Father 66  . Diabetes Sister   . Stroke Sister   . Cancer Neg Hx   . COPD Neg Hx     Past medical history, surgical history, medications, allergies, family history and  social history reviewed with patient today and changes made to appropriate areas of the chart.   Review of Systems - negative All other ROS negative except what is listed above and in the HPI.      Objective:    BP 111/71   Pulse 69   Temp 97.6 F (36.4 C) (Oral)   Ht 5\' 8"  (1.727 m)   Wt 238 lb 12.8 oz (108.3 kg)   SpO2 97%   BMI 36.31 kg/m   Wt Readings from Last 3 Encounters:  02/12/19 238 lb 12.8 oz (108.3 kg)  09/09/18 236 lb (107 kg)  08/22/18 225 lb (102.1 kg)    Physical Exam Vitals and nursing note reviewed.  Constitutional:      General: He is awake. He is not in acute distress.    Appearance: He is well-developed. He is obese. He is not ill-appearing.  HENT:     Head: Normocephalic and atraumatic.     Right Ear: Hearing, tympanic membrane, ear canal and external ear normal. No drainage.     Left Ear: Hearing, tympanic membrane, ear canal and external ear normal. No drainage.     Nose: Nose normal.     Mouth/Throat:     Pharynx: Uvula midline.  Eyes:     General: Lids are normal.        Right eye: No discharge.        Left eye: No discharge.     Extraocular Movements: Extraocular movements intact.     Conjunctiva/sclera: Conjunctivae normal.     Pupils: Pupils are equal, round, and reactive to light.     Visual Fields: Right eye visual fields normal and left eye visual fields normal.  Neck:     Thyroid: No thyromegaly.     Vascular: No carotid bruit or JVD.     Trachea: Trachea normal.  Cardiovascular:     Rate and  Rhythm: Normal rate and regular rhythm.     Heart sounds: Normal heart sounds, S1 normal and S2 normal. No murmur. No gallop.   Pulmonary:     Effort: Pulmonary effort is normal. No accessory muscle usage or respiratory distress.     Breath sounds: Normal breath sounds.  Abdominal:     General: Bowel sounds are normal.     Palpations: Abdomen is soft. There is no hepatomegaly or splenomegaly.     Tenderness: There is no abdominal tenderness.  Musculoskeletal:        General: Normal range of motion.     Cervical back: Normal range of motion and neck supple.     Right lower leg: No edema.     Left lower leg: No edema.  Lymphadenopathy:     Head:     Right side of head: No submental, submandibular, tonsillar, preauricular or posterior auricular adenopathy.     Left side of head: No submental, submandibular, tonsillar, preauricular or posterior auricular adenopathy.     Cervical: No cervical adenopathy.  Skin:    General: Skin is warm and dry.     Capillary Refill: Capillary refill takes less than 2 seconds.     Findings: No rash.  Neurological:     Mental Status: He is alert and oriented to person, place, and time.     Cranial Nerves: Cranial nerves are intact.     Gait: Gait is intact.     Deep Tendon Reflexes: Reflexes are normal and symmetric.     Reflex Scores:      Brachioradialis  reflexes are 2+ on the right side and 2+ on the left side.      Patellar reflexes are 2+ on the right side and 2+ on the left side. Psychiatric:        Attention and Perception: Attention normal.        Mood and Affect: Mood normal.        Speech: Speech normal.        Behavior: Behavior normal. Behavior is cooperative.        Thought Content: Thought content normal.        Cognition and Memory: Cognition normal.        Judgment: Judgment normal.     Results for orders placed or performed during the hospital encounter of 08/19/18  SARS CORONAVIRUS 2 Nasal Swab Aptima Multi Swab   Specimen:  Aptima Multi Swab; Nasal Swab  Result Value Ref Range   SARS Coronavirus 2 NEGATIVE NEGATIVE  Urinalysis, Complete w Microscopic  Result Value Ref Range   Color, Urine AMBER (A) YELLOW   APPearance HAZY (A) CLEAR   Specific Gravity, Urine 1.030 1.005 - 1.030   pH 5.0 5.0 - 8.0   Glucose, UA NEGATIVE NEGATIVE mg/dL   Hgb urine dipstick LARGE (A) NEGATIVE   Bilirubin Urine NEGATIVE NEGATIVE   Ketones, ur 5 (A) NEGATIVE mg/dL   Protein, ur 100 (A) NEGATIVE mg/dL   Nitrite NEGATIVE NEGATIVE   Leukocytes,Ua NEGATIVE NEGATIVE   RBC / HPF >50 (H) 0 - 5 RBC/hpf   WBC, UA 21-50 0 - 5 WBC/hpf   Bacteria, UA NONE SEEN NONE SEEN   Squamous Epithelial / LPF NONE SEEN 0 - 5   Mucus PRESENT   CBC  Result Value Ref Range   WBC 9.9 4.0 - 10.5 K/uL   RBC 5.16 4.22 - 5.81 MIL/uL   Hemoglobin 15.1 13.0 - 17.0 g/dL   HCT 44.5 39.0 - 52.0 %   MCV 86.2 80.0 - 100.0 fL   MCH 29.3 26.0 - 34.0 pg   MCHC 33.9 30.0 - 36.0 g/dL   RDW 14.1 11.5 - 15.5 %   Platelets 159 150 - 400 K/uL   nRBC 0.0 0.0 - 0.2 %  Comprehensive metabolic panel  Result Value Ref Range   Sodium 140 135 - 145 mmol/L   Potassium 4.3 3.5 - 5.1 mmol/L   Chloride 108 98 - 111 mmol/L   CO2 23 22 - 32 mmol/L   Glucose, Bld 113 (H) 70 - 99 mg/dL   BUN 21 8 - 23 mg/dL   Creatinine, Ser 1.63 (H) 0.61 - 1.24 mg/dL   Calcium 9.1 8.9 - 10.3 mg/dL   Total Protein 6.8 6.5 - 8.1 g/dL   Albumin 4.4 3.5 - 5.0 g/dL   AST 18 15 - 41 U/L   ALT 22 0 - 44 U/L   Alkaline Phosphatase 48 38 - 126 U/L   Total Bilirubin 1.3 (H) 0.3 - 1.2 mg/dL   GFR calc non Af Amer 42 (L) >60 mL/min   GFR calc Af Amer 48 (L) >60 mL/min   Anion gap 9 5 - 15  Basic metabolic panel  Result Value Ref Range   Sodium 139 135 - 145 mmol/L   Potassium 4.3 3.5 - 5.1 mmol/L   Chloride 105 98 - 111 mmol/L   CO2 27 22 - 32 mmol/L   Glucose, Bld 110 (H) 70 - 99 mg/dL   BUN 27 (H) 8 - 23 mg/dL   Creatinine, Ser 1.94 (  H) 0.61 - 1.24 mg/dL   Calcium 8.6 (L) 8.9 - 10.3  mg/dL   GFR calc non Af Amer 34 (L) >60 mL/min   GFR calc Af Amer 39 (L) >60 mL/min   Anion gap 7 5 - 15  CBC  Result Value Ref Range   WBC 8.2 4.0 - 10.5 K/uL   RBC 4.54 4.22 - 5.81 MIL/uL   Hemoglobin 13.4 13.0 - 17.0 g/dL   HCT 40.7 39.0 - 52.0 %   MCV 89.6 80.0 - 100.0 fL   MCH 29.5 26.0 - 34.0 pg   MCHC 32.9 30.0 - 36.0 g/dL   RDW 14.0 11.5 - 15.5 %   Platelets 118 (L) 150 - 400 K/uL   nRBC 0.0 0.0 - 0.2 %      Assessment & Plan:   Problem List Items Addressed This Visit      Cardiovascular and Mediastinum   Essential hypertension    Chronic, stable with BP below goal today.  Recommend he check BP at least weekly at home and document.  Continue current medication regimen and adjust as needed.  Labs obtained today and refills sent.  Advised him not to take Allegra D, but instead take plain Allegra.  Return in 6 months.      Relevant Medications   benazepril (LOTENSIN) 20 MG tablet   Other Relevant Orders   Comprehensive metabolic panel   CBC with Differential/Platelet   TSH   Lipid Panel w/o Chol/HDL Ratio   Microalbumin, Urine Waived     Genitourinary   Benign prostatic hyperplasia with urinary obstruction    Chronic, stable with minimal symptoms.  Check PSA today.  Continue current medication regimen and adjust as needed.  Return in 6 months.      Relevant Medications   tamsulosin (FLOMAX) 0.4 MG CAPS capsule   Other Relevant Orders   PSA     Other   Body mass index 35.0-35.9, adult    Recommend continued focus on healthy diet choices and regular physical activity (30 minutes 5 days a week).  Focus on small goals and set specific timeline for goal.       Other Visit Diagnoses    Annual physical exam    -  Primary   Annual labs today to include TSH, CBC, CMP, Lipid, PSA   Colon cancer screening       Relevant Orders   Ambulatory referral to Gastroenterology      Discussed aspirin prophylaxis for myocardial infarction prevention and decision was it was  not indicated  LABORATORY TESTING:  Health maintenance labs ordered today as discussed above.   The natural history of prostate cancer and ongoing controversy regarding screening and potential treatment outcomes of prostate cancer has been discussed with the patient. The meaning of a false positive PSA and a false negative PSA has been discussed. He indicates understanding of the limitations of this screening test and wishes to proceed with screening PSA testing.   IMMUNIZATIONS:   - Tdap: Tetanus vaccination status reviewed: last tetanus booster within 10 years. - Influenza: Up to date - Pneumovax: Up to date - Prevnar: Up to date - Zostavax vaccine: Refused  SCREENING: - Colonoscopy: Ordered today  Discussed with patient purpose of the colonoscopy is to detect colon cancer at curable precancerous or early stages   - AAA Screening: Not applicable  -Hearing Test: Not applicable  -Spirometry: Not applicable   PATIENT COUNSELING:    Sexuality: Discussed sexually transmitted diseases, partner selection, use  of condoms, avoidance of unintended pregnancy  and contraceptive alternatives.   Advised to avoid cigarette smoking.  I discussed with the patient that most people either abstain from alcohol or drink within safe limits (<=14/week and <=4 drinks/occasion for males, <=7/weeks and <= 3 drinks/occasion for females) and that the risk for alcohol disorders and other health effects rises proportionally with the number of drinks per week and how often a drinker exceeds daily limits.  Discussed cessation/primary prevention of drug use and availability of treatment for abuse.   Diet: Encouraged to adjust caloric intake to maintain  or achieve ideal body weight, to reduce intake of dietary saturated fat and total fat, to limit sodium intake by avoiding high sodium foods and not adding table salt, and to maintain adequate dietary potassium and calcium preferably from fresh fruits, vegetables,  and low-fat dairy products.    stressed the importance of regular exercise  Injury prevention: Discussed safety belts, safety helmets, smoke detector, smoking near bedding or upholstery.   Dental health: Discussed importance of regular tooth brushing, flossing, and dental visits.   Follow up plan: NEXT PREVENTATIVE PHYSICAL DUE IN 1 YEAR. Return in about 6 months (around 08/12/2019) for HTN and BPH.

## 2019-02-12 NOTE — Assessment & Plan Note (Signed)
Recommend continued focus on healthy diet choices and regular physical activity (30 minutes 5 days a week).  Focus on small goals and set specific timeline for goal.

## 2019-02-12 NOTE — Assessment & Plan Note (Addendum)
Chronic, stable with BP below goal today.  Recommend he check BP at least weekly at home and document.  Continue current medication regimen and adjust as needed.  Labs obtained today and refills sent.  Advised him not to take Allegra D, but instead take plain Allegra.  Return in 6 months.

## 2019-02-13 ENCOUNTER — Encounter: Payer: Self-pay | Admitting: Nurse Practitioner

## 2019-02-13 DIAGNOSIS — N183 Chronic kidney disease, stage 3 unspecified: Secondary | ICD-10-CM | POA: Insufficient documentation

## 2019-02-13 LAB — COMPREHENSIVE METABOLIC PANEL
ALT: 22 IU/L (ref 0–44)
AST: 16 IU/L (ref 0–40)
Albumin/Globulin Ratio: 2.6 — ABNORMAL HIGH (ref 1.2–2.2)
Albumin: 4.5 g/dL (ref 3.7–4.7)
Alkaline Phosphatase: 53 IU/L (ref 39–117)
BUN/Creatinine Ratio: 15 (ref 10–24)
BUN: 19 mg/dL (ref 8–27)
Bilirubin Total: 0.4 mg/dL (ref 0.0–1.2)
CO2: 22 mmol/L (ref 20–29)
Calcium: 9.2 mg/dL (ref 8.6–10.2)
Chloride: 103 mmol/L (ref 96–106)
Creatinine, Ser: 1.28 mg/dL — ABNORMAL HIGH (ref 0.76–1.27)
GFR calc Af Amer: 65 mL/min/{1.73_m2} (ref >59)
GFR calc non Af Amer: 56 mL/min/{1.73_m2} — ABNORMAL LOW (ref >59)
Globulin, Total: 1.7 g/dL (ref 1.5–4.5)
Glucose: 83 mg/dL (ref 65–99)
Potassium: 4.9 mmol/L (ref 3.5–5.2)
Sodium: 139 mmol/L (ref 134–144)
Total Protein: 6.2 g/dL (ref 6.0–8.5)

## 2019-02-13 LAB — CBC WITH DIFFERENTIAL/PLATELET
Basophils Absolute: 0 10*3/uL (ref 0.0–0.2)
Basos: 0 %
EOS (ABSOLUTE): 0.1 10*3/uL (ref 0.0–0.4)
Eos: 2 %
Hematocrit: 46.1 % (ref 37.5–51.0)
Hemoglobin: 16 g/dL (ref 13.0–17.7)
Immature Grans (Abs): 0 10*3/uL (ref 0.0–0.1)
Immature Granulocytes: 0 %
Lymphocytes Absolute: 1 10*3/uL (ref 0.7–3.1)
Lymphs: 21 %
MCH: 29.9 pg (ref 26.6–33.0)
MCHC: 34.7 g/dL (ref 31.5–35.7)
MCV: 86 fL (ref 79–97)
Monocytes Absolute: 0.4 10*3/uL (ref 0.1–0.9)
Monocytes: 9 %
Neutrophils Absolute: 3.1 10*3/uL (ref 1.4–7.0)
Neutrophils: 68 %
Platelets: 166 10*3/uL (ref 150–450)
RBC: 5.35 x10E6/uL (ref 4.14–5.80)
RDW: 13.5 % (ref 11.6–15.4)
WBC: 4.6 10*3/uL (ref 3.4–10.8)

## 2019-02-13 LAB — TSH: TSH: 2.33 u[IU]/mL (ref 0.450–4.500)

## 2019-02-13 LAB — LIPID PANEL W/O CHOL/HDL RATIO
Cholesterol, Total: 146 mg/dL (ref 100–199)
HDL: 42 mg/dL (ref >39)
LDL Chol Calc (NIH): 88 mg/dL (ref 0–99)
Triglycerides: 83 mg/dL (ref 0–149)
VLDL Cholesterol Cal: 16 mg/dL (ref 5–40)

## 2019-02-13 LAB — PSA: Prostate Specific Ag, Serum: 2.5 ng/mL (ref 0.0–4.0)

## 2019-02-13 NOTE — Progress Notes (Signed)
Please let Logan Willis know that overall labs look great and are in normal ranges.  Continue current medication regimen.  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  Continue Benazepril which is kidney protective and ensure to get lots of water in during daytime hours.  Have a great day.

## 2019-02-18 ENCOUNTER — Telehealth: Payer: Self-pay

## 2019-02-18 NOTE — Telephone Encounter (Signed)
4th attempt to call pt- LM on VM to call back with call back number provided.Routing note to office to send letter with results.

## 2019-02-24 ENCOUNTER — Telehealth: Payer: Self-pay | Admitting: *Deleted

## 2019-02-24 NOTE — Telephone Encounter (Signed)
Pt called in and was given the message from Marnee Guarneri, NP regarding his lab results dated 02/13/2019 at 1:31 PM.  He did not have any questions.

## 2019-02-24 NOTE — Telephone Encounter (Signed)
Noted  

## 2019-02-25 ENCOUNTER — Encounter: Payer: Self-pay | Admitting: Nurse Practitioner

## 2019-03-05 ENCOUNTER — Encounter: Payer: Self-pay | Admitting: *Deleted

## 2019-03-06 ENCOUNTER — Ambulatory Visit: Payer: 59 | Attending: Internal Medicine

## 2019-03-06 DIAGNOSIS — Z23 Encounter for immunization: Secondary | ICD-10-CM

## 2019-03-06 NOTE — Progress Notes (Signed)
   Covid-19 Vaccination Clinic  Name:  Logan Willis    MRN: [id number] DOB: 10/19/1945  03/06/2019  [name] was observed post Covid-19 immunization for 15 minutes without incidence. He was provided with Vaccine Information Sheet and instruction to access the V-Safe system.   [name] was instructed to call 911 with any severe reactions post vaccine: Marland Kitchen Difficulty breathing  . Swelling of your face and throat  . A fast heartbeat  . A bad rash all over your body  . Dizziness and weakness    Immunizations Administered    Name Date Dose VIS Date Route   Pfizer COVID-19 Vaccine 03/06/2019  8:33 AM 0.3 mL 12/20/2018 Intramuscular   Manufacturer: [place]   Lot: [id number]   [place]: [id number]

## 2019-03-19 ENCOUNTER — Other Ambulatory Visit: Payer: Self-pay

## 2019-03-19 DIAGNOSIS — B001 Herpesviral vesicular dermatitis: Secondary | ICD-10-CM

## 2019-03-19 MED ORDER — VALACYCLOVIR HCL 500 MG PO TABS: 1000.0000 mg | ORAL_TABLET | Freq: Two times a day (BID) | ORAL | 12 refills | Status: DC

## 2019-03-19 NOTE — Telephone Encounter (Signed)
Patient last seen 02/12/19 and has appointment 08/13/19

## 2019-03-21 ENCOUNTER — Encounter: Payer: Self-pay | Admitting: Nurse Practitioner

## 2019-04-01 ENCOUNTER — Ambulatory Visit: Payer: 59 | Attending: Internal Medicine

## 2019-04-01 DIAGNOSIS — Z23 Encounter for immunization: Secondary | ICD-10-CM

## 2019-04-01 NOTE — Progress Notes (Signed)
   Covid-19 Vaccination Clinic  Name:  Logan Willis    MRN: [id number] DOB: 03/25/1945  04/01/2019  [name] was observed post Covid-19 immunization for 15 minutes without incident. He was provided with Vaccine Information Sheet and instruction to access the V-Safe system.   [name] was instructed to call 911 with any severe reactions post vaccine: Marland Kitchen Difficulty breathing  . Swelling of face and throat  . A fast heartbeat  . A bad rash all over body  . Dizziness and weakness   Immunizations Administered    Name Date Dose VIS Date Route   Pfizer COVID-19 Vaccine 04/01/2019  8:43 AM 0.3 mL 12/20/2018 Intramuscular   Manufacturer: [place]   Lot: [id number]   [place]: [id number]

## 2019-04-02 ENCOUNTER — Ambulatory Visit (INDEPENDENT_AMBULATORY_CARE_PROVIDER_SITE_OTHER): Payer: Self-pay | Admitting: Gastroenterology

## 2019-04-02 VITALS — Ht 68.0 in | Wt 238.0 lb

## 2019-04-02 DIAGNOSIS — Z86010 Personal history of colonic polyps: Secondary | ICD-10-CM

## 2019-04-02 MED ORDER — PEG 3350-KCL-NA BICARB-NACL 420 G PO SOLR: 4000.0000 mL | Freq: Once | ORAL | 0 refills | Status: AC

## 2019-04-02 NOTE — Progress Notes (Signed)
Gastroenterology Pre-Procedure Review  Request Date: Tuesday 04/29/19 Requesting Physician: Dr. Allen Norris  PATIENT REVIEW QUESTIONS: The patient responded to the following health history questions as indicated:    1. Are you having any GI issues? no 2. Do you have a personal history of Polyps? yes (2016 Dr. Allen Norris performed.) 3. Do you have a family history of Colon Cancer or Polyps? no 4. Diabetes Mellitus? no 5. Joint replacements in the past 12 months?no 6. Major health problems in the past 3 months?2008 Colonectomy at [facility] resulted in pt having hernia repair 2012 7. Any artificial heart valves, MVP, or defibrillator?no    MEDICATIONS & ALLERGIES:    Patient reports the following regarding taking any anticoagulation/antiplatelet therapy:   Plavix, Coumadin, Eliquis, Xarelto, Lovenox, Pradaxa, Brilinta, or Effient? no Aspirin? no  Patient confirms/reports the following medications:  Current Outpatient Medications  Medication Sig Dispense Refill  . benazepril (LOTENSIN) 20 MG tablet Take 1 tablet (20 mg total) by mouth daily. 90 tablet 4  . fexofenadine-pseudoephedrine (ALLEGRA-D 24) 180-240 MG 24 hr tablet Take 1 tablet by mouth daily as needed.    Marland Kitchen ketoconazole (NIZORAL) 2 % cream Apply  a small amount to affected area at bedtime  to feet and between toes    . tamsulosin (FLOMAX) 0.4 MG CAPS capsule Take 1 capsule (0.4 mg total) by mouth daily. 90 capsule 4  . terbinafine (LAMISIL) 250 MG tablet Take 250 mg by mouth daily.    Marland Kitchen triamcinolone cream (KENALOG) 0.1 % Apply 1 application topically 2 (two) times daily.    . valACYclovir (VALTREX) 500 MG tablet Take 2 tablets (1,000 mg total) by mouth 2 (two) times daily. 28 tablet 12   No current facility-administered medications for this visit.    Patient confirms/reports the following allergies:  No Known Allergies  No orders of the defined types were placed in this encounter.   AUTHORIZATION INFORMATION Primary  Insurance: 1D#: Group #:  Secondary Insurance: 1D#: Group #:  SCHEDULE INFORMATION: Date:  Time: Location:

## 2019-04-02 NOTE — Patient Instructions (Addendum)
Colonoscopy has been scheduled with Dr. Lucilla Lame at [facility] on Tuesday 04/29/19.  Please call the Endoscopy Dept on 04/19 at 1pm for arrival time. Endoscopy phone number is [phone].  Rx for Nulytely Bowel Prep has been sent electronically to [facility] located on [address].  Phone number [phone].  Follow the preparation instructions for mixing Bowel Prep.  Begin bowel prep at 5pm on 04/19 (Monday).  Drink 8 oz every 30 minutes until completed all liquid.  Do not eat or Drink anything 4 hours prior to procedure.  Mandatory COVID Test Friday 04/25/19 [facility] on [address].  Please arrive between 8am and 1pm for drive up testing.  Please contact your insurance to discuss possible out of pocket cost related to your procedure. Your colonoscopy diagnosis code is Z86.010.  Procedure Code [phone] You may also call to get an estimate through [facility].  This is only an estimate.  The phone number is [phone].  Thank you for allowing [place] to serve your healthcare needs.  Sharyn Lull, [facility] [phone]

## 2019-04-15 ENCOUNTER — Other Ambulatory Visit: Payer: Self-pay

## 2019-04-15 ENCOUNTER — Encounter: Payer: Self-pay | Admitting: Dermatology

## 2019-04-15 ENCOUNTER — Ambulatory Visit (INDEPENDENT_AMBULATORY_CARE_PROVIDER_SITE_OTHER): Payer: 59 | Admitting: Dermatology

## 2019-04-15 DIAGNOSIS — L570 Actinic keratosis: Secondary | ICD-10-CM | POA: Diagnosis not present

## 2019-04-15 DIAGNOSIS — B353 Tinea pedis: Secondary | ICD-10-CM | POA: Diagnosis not present

## 2019-04-15 DIAGNOSIS — B351 Tinea unguium: Secondary | ICD-10-CM | POA: Diagnosis not present

## 2019-04-15 DIAGNOSIS — L578 Other skin changes due to chronic exposure to nonionizing radiation: Secondary | ICD-10-CM

## 2019-04-15 DIAGNOSIS — L820 Inflamed seborrheic keratosis: Secondary | ICD-10-CM | POA: Diagnosis not present

## 2019-04-15 MED ORDER — TERBINAFINE HCL 250 MG PO TABS: 250.0000 mg | ORAL_TABLET | Freq: Every day | ORAL | 1 refills | Status: DC

## 2019-04-15 NOTE — Progress Notes (Signed)
   Follow-Up Visit   Subjective  Logan Willis is a 74 y.o. male who presents for the following: Actinic Keratosis (scalp and temples x 10; patient has noticed another rough scaly patch on his L temple) and tinea pedis, unguium (Patient has noticed an improvement in his feet and toenails since using Ketoconazole 2% cream QHS and Lamisil 250mg  po QD x 1 month. He did stop Lamisil early because he got the COVID vaccine and started experiencing fever blisters afterward.).  The following portions of the chart were reviewed this encounter and updated as appropriate:    Review of Systems: No other skin or systemic complaints.  Objective  Well appearing patient in no apparent distress; mood and affect are within normal limits.  A focused examination was performed including the face, feet, and scalp. Relevant physical exam findings are noted in the Assessment and Plan.  Objective  R ear x 1: Erythematous thin papules/macules with gritty scale.   Objective  Face and scalp: Diffuse scaly erythematous macules with underlying dyspigmentation.   Objective  B/L cheek (2): Erythematous keratotic or waxy stuck-on papule or plaque.   Objective  B/L feet: Scale of the feet.  Objective  B/L toenails: Toenail dystrophy on the lat nails.  Assessment & Plan  AK (actinic keratosis) R ear x 1  Destruction of lesion - R ear x 1 Complexity: simple   Destruction method: cryotherapy   Informed consent: discussed and consent obtained   Timeout:  patient name, date of birth, surgical site, and procedure verified Lesion destroyed using liquid nitrogen: Yes   Region frozen until ice ball extended beyond lesion: Yes   Outcome: patient tolerated procedure well with no complications   Post-procedure details: wound care instructions given    Actinic skin damage Face and scalp  Recommend daily broad spectrum sunscreen SPF 30+ to sun-exposed areas, reapply every 2 hours as needed. Call for new or  changing lesions.   Inflamed seborrheic keratosis (2) B/L cheek  Tinea pedis of left foot B/L feet  Continue Lamisil 250mg  po QD #30 1RF and Ketoconazole 2% cream QHS. Patient does not have liver issues nor is on cholesterol medications. The patient tolerated oral Lamisil well but discontinued it after his Covid vaccine when he started getting fever blisters.  Advised that he should not have any trouble with this and to let us know if he has any side effects.  Tinea unguium B/L toenails  Continue Lamisil 250mg  1 tab po QD #30 1RF. Patient does not have liver issues nor is on cholesterol medications.   terbinafine (LAMISIL) 250 MG tablet - B/L toenails  Return in about 6 months (around 10/15/2019).   Tanya Nones, CMA, am acting as scribe for Sarina Ser, MD .

## 2019-04-25 ENCOUNTER — Other Ambulatory Visit
Admission: RE | Admit: 2019-04-25 | Discharge: 2019-04-25 | Disposition: A | Discharge status: Home / Self Care | Payer: 59 | Source: Ambulatory Visit | Attending: Gastroenterology | Admitting: Gastroenterology

## 2019-04-25 ENCOUNTER — Other Ambulatory Visit: Payer: Self-pay

## 2019-04-25 DIAGNOSIS — Z20822 Contact with and (suspected) exposure to covid-19: Secondary | ICD-10-CM | POA: Insufficient documentation

## 2019-04-25 DIAGNOSIS — Z01812 Encounter for preprocedural laboratory examination: Secondary | ICD-10-CM | POA: Diagnosis present

## 2019-04-25 LAB — SARS CORONAVIRUS 2 (TAT 6-24 HRS): SARS Coronavirus 2: NEGATIVE

## 2019-04-29 ENCOUNTER — Ambulatory Visit: Payer: No Typology Code available for payment source | Admitting: Registered Nurse

## 2019-04-29 ENCOUNTER — Encounter: Payer: Self-pay | Admitting: Gastroenterology

## 2019-04-29 ENCOUNTER — Other Ambulatory Visit: Payer: Self-pay

## 2019-04-29 ENCOUNTER — Encounter
Admission: RE | Disposition: A | Discharge status: Home / Self Care | Payer: Self-pay | Source: Home / Self Care | Attending: Gastroenterology

## 2019-04-29 ENCOUNTER — Ambulatory Visit
Admission: RE | Admit: 2019-04-29 | Discharge: 2019-04-29 | Disposition: A | Discharge status: Home / Self Care | Payer: No Typology Code available for payment source | Attending: Gastroenterology | Admitting: Gastroenterology

## 2019-04-29 DIAGNOSIS — Z79899 Other long term (current) drug therapy: Secondary | ICD-10-CM | POA: Insufficient documentation

## 2019-04-29 DIAGNOSIS — K5730 Diverticulosis of large intestine without perforation or abscess without bleeding: Secondary | ICD-10-CM | POA: Insufficient documentation

## 2019-04-29 DIAGNOSIS — Z87442 Personal history of urinary calculi: Secondary | ICD-10-CM | POA: Diagnosis not present

## 2019-04-29 DIAGNOSIS — I10 Essential (primary) hypertension: Secondary | ICD-10-CM | POA: Diagnosis not present

## 2019-04-29 DIAGNOSIS — K640 First degree hemorrhoids: Secondary | ICD-10-CM | POA: Insufficient documentation

## 2019-04-29 DIAGNOSIS — Z860100 Personal history of colon polyps, unspecified: Secondary | ICD-10-CM

## 2019-04-29 DIAGNOSIS — K635 Polyp of colon: Secondary | ICD-10-CM | POA: Insufficient documentation

## 2019-04-29 DIAGNOSIS — Z1211 Encounter for screening for malignant neoplasm of colon: Secondary | ICD-10-CM | POA: Insufficient documentation

## 2019-04-29 DIAGNOSIS — D123 Benign neoplasm of transverse colon: Secondary | ICD-10-CM | POA: Diagnosis not present

## 2019-04-29 HISTORY — PX: COLONOSCOPY WITH PROPOFOL: SHX5780

## 2019-04-29 SURGERY — COLONOSCOPY WITH PROPOFOL
Anesthesia: General

## 2019-04-29 MED ORDER — SODIUM CHLORIDE 0.9 % IV SOLN
INTRAVENOUS | Status: DC
  Administered 2019-04-29: 1000 mL via INTRAVENOUS

## 2019-04-29 MED ORDER — PROPOFOL 10 MG/ML IV BOLUS
INTRAVENOUS | Status: DC | PRN
  Administered 2019-04-29: 70 mg via INTRAVENOUS

## 2019-04-29 MED ORDER — PROPOFOL 500 MG/50ML IV EMUL
INTRAVENOUS | Status: DC | PRN
  Administered 2019-04-29: 140 ug/kg/min via INTRAVENOUS

## 2019-04-29 NOTE — Transfer of Care (Signed)
Immediate Anesthesia Transfer of Care Note  Patient: Logan Willis  Procedure(s) Performed: COLONOSCOPY WITH PROPOFOL (N/A )  Patient Location: PACU  Anesthesia Type:General  Level of Consciousness: sedated  Airway & Oxygen Therapy: Patient Spontanous Breathing  Post-op Assessment: Report given to RN and Post -op Vital signs reviewed and stable  Post vital signs: Reviewed and stable  Last Vitals:  Vitals Value Taken Time  BP 110/66 04/29/19 0942  Temp 35.7 C 04/29/19 0942  Pulse 69 04/29/19 0942  Resp 17 04/29/19 0942  SpO2 90 % 04/29/19 0942  Vitals shown include unvalidated device data.  Last Pain:  Vitals:   04/29/19 0942  TempSrc: Temporal  PainSc:          Complications: No apparent anesthesia complications

## 2019-04-29 NOTE — Op Note (Signed)
[facility] Gastroenterology Patient Name: Logan Willis Procedure Date: 04/29/2019 9:16 AM MRN: [id number] Account #: [id number] Date of Birth: Jul 04, 1945 Admit Type: Outpatient Age: 74 Room: [facility] ENDO ROOM 4 Gender: Male Note Status: Finalized Procedure:             Colonoscopy Indications:           High risk colon cancer surveillance: Personal history                         of colonic polyps Providers:             Lucilla Lame MD, MD Referring MD:          Guadalupe Maple, MD (Referring MD) Medicines:             Propofol per Anesthesia Complications:         No immediate complications. Procedure:             Pre-Anesthesia Assessment:                        - Prior to the procedure, a History and Physical was                         performed, and patient medications and allergies were                         reviewed. The patient's tolerance of previous                         anesthesia was also reviewed. The risks and benefits                         of the procedure and the sedation options and risks                         were discussed with the patient. All questions were                         answered, and informed consent was obtained. Prior                         Anticoagulants: The patient has taken no previous                         anticoagulant or antiplatelet agents. ASA Grade                         Assessment: II - A patient with mild systemic disease.                         After reviewing the risks and benefits, the patient                         was deemed in satisfactory condition to undergo the                         procedure.  After obtaining informed consent, the colonoscope was                         passed under direct vision. Throughout the procedure,                         the patient's blood pressure, pulse, and oxygen                         saturations were monitored continuously. The                Colonoscope was introduced through the anus and                         advanced to the the ileocolonic anastomosis. The                         colonoscopy was performed without difficulty. The                         patient tolerated the procedure well. The quality of                         the bowel preparation was excellent. Findings:      The perianal and digital rectal examinations were normal.      Three sessile polyps were found in the transverse colon. The polyps were       4 to 6 mm in size. These polyps were removed with a cold snare.       Resection and retrieval were complete.      Two sessile polyps were found in the transverse colon. The polyps were 3       to 4 mm in size. These polyps were removed with a cold biopsy forceps.       Resection and retrieval were complete.      A 3 mm polyp was found in the descending colon. The polyp was sessile.       The polyp was removed with a cold biopsy forceps. Resection and       retrieval were complete.      A few small-mouthed diverticula were found in the sigmoid colon.      Non-bleeding internal hemorrhoids were found during retroflexion. The       hemorrhoids were Grade I (internal hemorrhoids that do not prolapse). Impression:            - Three 4 to 6 mm polyps in the transverse colon,                         removed with a cold snare. Resected and retrieved.                        - Two 3 to 4 mm polyps in the transverse colon,                         removed with a cold biopsy forceps. Resected and                         retrieved.                        -  One 3 mm polyp in the descending colon, removed with                         a cold biopsy forceps. Resected and retrieved.                        - Diverticulosis in the sigmoid colon.                        - Non-bleeding internal hemorrhoids. Recommendation:        - Discharge patient to home.                        - Resume previous diet.                         - Continue present medications.                        - Await pathology results.                        - Repeat colonoscopy in 5 years for surveillance. Procedure Code(s):     --- Professional ---                        [id number], Colonoscopy, flexible; with removal of                         tumor(s), polyp(s), or other lesion(s) by snare                         technique                        45380, 22, Colonoscopy, flexible; with biopsy, single                         or multiple Diagnosis Code(s):     --- Professional ---                        Z86.010, Personal history of colonic polyps                        K63.5, Polyp of colon CPT copyright 2019 American Medical Association. All rights reserved. The codes documented in this report are preliminary and upon coder review may  be revised to meet current compliance requirements. Lucilla Lame MD, MD 04/29/2019 9:41:24 AM This report has been signed electronically. Number of Addenda: 0 Note Initiated On: 04/29/2019 9:16 AM Scope Withdrawal Time: 0 hours 7 minutes 10 seconds  Total Procedure Duration: 0 hours 17 minutes 35 seconds  Estimated Blood Loss:  Estimated blood loss: none.      [facility]

## 2019-04-29 NOTE — Anesthesia Preprocedure Evaluation (Signed)
Anesthesia Evaluation  Patient identified by MRN, date of birth, ID band Patient awake    Reviewed: Allergy & Precautions, NPO status , Patient's Chart, lab work & pertinent test results  History of Anesthesia Complications Negative for: history of anesthetic complications  Airway Mallampati: II       Dental   Pulmonary neg sleep apnea, neg COPD, Not current smoker,           Cardiovascular hypertension, Pt. on medications (-) Past MI and (-) CHF (-) dysrhythmias (-) Valvular Problems/Murmurs     Neuro/Psych neg Seizures    GI/Hepatic Neg liver ROS, neg GERD  ,  Endo/Other  neg diabetes  Renal/GU Renal InsufficiencyRenal disease (stones)     Musculoskeletal   Abdominal   Peds  Hematology   Anesthesia Other Findings   Reproductive/Obstetrics                             Anesthesia Physical Anesthesia Plan  ASA: II  Anesthesia Plan: General   Post-op Pain Management:    Induction: Intravenous  PONV Risk Score and Plan: 2 and Propofol infusion and TIVA  Airway Management Planned: Nasal Cannula  Additional Equipment:   Intra-op Plan:   Post-operative Plan:   Informed Consent: I have reviewed the patients History and Physical, chart, labs and discussed the procedure including the risks, benefits and alternatives for the proposed anesthesia with the patient or authorized representative who has indicated his/her understanding and acceptance.       Plan Discussed with:   Anesthesia Plan Comments:         Anesthesia Quick Evaluation

## 2019-04-29 NOTE — H&P (Signed)
Logan Lame, Willis [address] [place], [place] [address] Phone:[phone] Fax : [phone]  Primary Care Physician:  Logan Maple, Willis Primary Gastroenterologist:  Dr. Allen Willis  Pre-Procedure History & Physical: HPI:  Logan Willis is a 74 y.o. male is here for an colonoscopy.   Past Medical History:  Diagnosis Date  . Actinic keratosis   . Body mass index 35.0-35.9, adult   . Cold sore   . Hypertension   . Kidney stone 08/2018    Past Surgical History:  Procedure Laterality Date  . COLON SURGERY     colectomy 2008  . COLONOSCOPY    . COLONOSCOPY WITH PROPOFOL N/A 12/10/2014   Procedure: COLONOSCOPY WITH PROPOFOL;  Surgeon: Logan Lame, Willis;  Location: [facility];  Service: Endoscopy;  Laterality: N/A;  . EXTRACORPOREAL SHOCK WAVE LITHOTRIPSY Left 08/22/2018   Procedure: EXTRACORPOREAL SHOCK WAVE LITHOTRIPSY (ESWL);  Surgeon: Logan Co, Willis;  Location: [facility] ORS;  Service: Urology;  Laterality: Left;  . HERNIA REPAIR      Prior to Admission medications   Medication Sig Start Date End Date Taking? Authorizing Provider  benazepril (LOTENSIN) 20 MG tablet Take 1 tablet (20 mg total) by mouth daily. 02/12/19   Logan Willis  fexofenadine-pseudoephedrine (ALLEGRA-D 24) 180-240 MG 24 hr tablet Take 1 tablet by mouth daily as needed.    Provider, Historical, Willis  ketoconazole (NIZORAL) 2 % cream Apply  a small amount to affected area at bedtime  to feet and between toes 03/04/19   Provider, Historical, Willis  tamsulosin (FLOMAX) 0.4 MG CAPS capsule Take 1 capsule (0.4 mg total) by mouth daily. 02/12/19   Logan Willis  terbinafine (LAMISIL) 250 MG tablet Take 250 mg by mouth daily. 03/04/19   Provider, Historical, Willis  terbinafine (LAMISIL) 250 MG tablet Take 1 tablet (250 mg total) by mouth daily. 04/15/19   Logan Willis  triamcinolone cream (KENALOG) 0.1 % Apply 1 application topically 2 (two) times daily.    Provider, Historical, Willis    valACYclovir (VALTREX) 500 MG tablet Take 2 tablets (1,000 mg total) by mouth 2 (two) times daily. 03/19/19   Logan Willis    Allergies as of 04/02/2019  . (No Known Allergies)    Family History  Problem Relation Age of Onset  . Heart disease Mother 75  . Heart disease Father 8  . Diabetes Sister   . Stroke Sister   . Cancer Neg Hx   . COPD Neg Hx     Social History   Socioeconomic History  . Marital status: Married    Spouse name: Not on file  . Number of children: Not on file  . Years of education: Not on file  . Highest education level: Not on file  Occupational History  . Not on file  Tobacco Use  . Smoking status: Never Smoker  . Smokeless tobacco: Never Used  Substance and Sexual Activity  . Alcohol use: No  . Drug use: No  . Sexual activity: Yes    Birth control/protection: None  Other Topics Concern  . Not on file  Social History Narrative  . Not on file   Social Determinants of Health   Financial Resource Strain:   . Difficulty of Paying Living Expenses:   Food Insecurity:   . Worried About Charity fundraiser in the Last Year:   . Arboriculturist in the Last Year:   Transportation Needs:   .  Lack of Transportation (Medical):   Marland Kitchen Lack of Transportation (Non-Medical):   Physical Activity:   . Days of Exercise per Week:   . Minutes of Exercise per Session:   Stress:   . Feeling of Stress :   Social Connections:   . Frequency of Communication with Friends and Family:   . Frequency of Social Gatherings with Friends and Family:   . Attends Religious Services:   . Active Member of Clubs or Organizations:   . Attends Archivist Meetings:   Marland Kitchen Marital Status:   Intimate Partner Violence:   . Fear of Current or Ex-Partner:   . Emotionally Abused:   Marland Kitchen Physically Abused:   . Sexually Abused:     Review of Systems: See HPI, otherwise negative ROS  Physical Exam: There were no vitals taken for this visit. General:   Alert,   pleasant and cooperative in NAD Head:  Normocephalic and atraumatic. Neck:  Supple; no masses or thyromegaly. Lungs:  Clear throughout to auscultation.    Heart:  Regular rate and rhythm. Abdomen:  Soft, nontender and nondistended. Normal bowel sounds, without guarding, and without rebound.   Neurologic:  Alert and  oriented x4;  grossly normal neurologically.  Impression/Plan: Logan Willis is here for an colonoscopy to be performed for history of adenomatous polyps 12/10/2014  Risks, benefits, limitations, and alternatives regarding  colonoscopy have been reviewed with the patient.  Questions have been answered.  All parties agreeable.   Logan Lame, Willis  04/29/2019, 9:06 AM

## 2019-04-29 NOTE — Anesthesia Postprocedure Evaluation (Signed)
Anesthesia Post Note  Patient: Logan Willis  Procedure(s) Performed: COLONOSCOPY WITH PROPOFOL (N/A )  Patient location during evaluation: Endoscopy Anesthesia Type: General Level of consciousness: awake and alert Pain management: pain level controlled Vital Signs Assessment: post-procedure vital signs reviewed and stable Respiratory status: spontaneous breathing and respiratory function stable Cardiovascular status: stable Anesthetic complications: no     Last Vitals:  Vitals:   04/29/19 0853 04/29/19 0942  BP: 130/83 110/66  Pulse: 64   Resp: 18   Temp: (!) 35.8 C (!) 35.7 C  SpO2: 100%     Last Pain:  Vitals:   04/29/19 0942  TempSrc: Temporal  PainSc: 0-No pain                 Charrie Mcconnon K

## 2019-04-30 ENCOUNTER — Encounter: Payer: Self-pay | Admitting: *Deleted

## 2019-04-30 LAB — SURGICAL PATHOLOGY

## 2019-05-01 ENCOUNTER — Encounter: Payer: Self-pay | Admitting: Gastroenterology

## 2019-06-15 ENCOUNTER — Other Ambulatory Visit: Payer: Self-pay | Admitting: Dermatology

## 2019-08-13 ENCOUNTER — Ambulatory Visit: Payer: No Typology Code available for payment source | Admitting: Nurse Practitioner

## 2019-08-27 ENCOUNTER — Ambulatory Visit (INDEPENDENT_AMBULATORY_CARE_PROVIDER_SITE_OTHER): Payer: No Typology Code available for payment source | Admitting: Family Medicine

## 2019-08-27 ENCOUNTER — Ambulatory Visit: Payer: No Typology Code available for payment source | Admitting: Nurse Practitioner

## 2019-08-27 ENCOUNTER — Other Ambulatory Visit: Payer: Self-pay

## 2019-08-27 ENCOUNTER — Encounter: Payer: Self-pay | Admitting: Family Medicine

## 2019-08-27 VITALS — BP 117/74 | HR 65 | Temp 98.3°F | Wt 240.0 lb

## 2019-08-27 DIAGNOSIS — N401 Enlarged prostate with lower urinary tract symptoms: Secondary | ICD-10-CM

## 2019-08-27 DIAGNOSIS — Z13220 Encounter for screening for lipoid disorders: Secondary | ICD-10-CM | POA: Diagnosis not present

## 2019-08-27 DIAGNOSIS — N183 Chronic kidney disease, stage 3 unspecified: Secondary | ICD-10-CM | POA: Diagnosis not present

## 2019-08-27 DIAGNOSIS — I10 Essential (primary) hypertension: Secondary | ICD-10-CM

## 2019-08-27 DIAGNOSIS — N138 Other obstructive and reflux uropathy: Secondary | ICD-10-CM

## 2019-08-27 LAB — MICROALBUMIN, URINE WAIVED
Creatinine, Urine Waived: 100 mg/dL (ref 10–300)
Microalb, Ur Waived: 10 mg/L (ref 0–19)
Microalb/Creat Ratio: <30 mg/g (ref <30)

## 2019-08-27 MED ORDER — SIMETHICONE 125 MG PO TABS: 1.0000 | ORAL_TABLET | Freq: Every day | ORAL | 4 refills | Status: DC

## 2019-08-27 NOTE — Progress Notes (Signed)
BP 117/74 (BP Location: Left Arm, Patient Position: Sitting, Cuff Size: Normal)   Pulse 65   Temp 98.3 F (36.8 C) (Oral)   Wt 240 lb (108.9 kg)   SpO2 96%   BMI 36.49 kg/m    Subjective:    Patient ID: Logan Willis, male    DOB: [birth year], 74 y.o.   MRN: [id number]  HPI: Logan Willis is a 74 y.o. male  Chief Complaint  Patient presents with  . Hypertension  . Benign Prostatic Hypertrophy  . burping   HYPERTENSION Hypertension status: controlled  Satisfied with current treatment? yes Duration of hypertension: chronic BP monitoring frequency:  not checking BP medication side effects:  no Medication compliance: excellent compliance Previous BP meds: benazepril Aspirin: no Recurrent headaches: no Visual changes: no Palpitations: no Dyspnea: no Chest pain: no Lower extremity edema: no Dizzy/lightheaded: no   BPH BPH status: controlled Satisfied with current treatment?: yes Medication side effects: no Medication compliance: excellent compliance Duration: chronic Nocturia: 1-2x per night Urinary frequency:yes Incomplete voiding: no Urgency: no Weak urinary stream: no Straining to start stream: no Dysuria: no Onset: gradual Severity: mild  Has been burping for several months. Occasionally has heartburn with it, but that's rare, gets better with maalox.   Relevant past medical, surgical, family and social history reviewed and updated as indicated. Interim medical history since our last visit reviewed. Allergies and medications reviewed and updated.  Review of Systems  Constitutional: Negative.   Respiratory: Negative.   Cardiovascular: Negative.   Gastrointestinal: Negative.   Musculoskeletal: Negative.   Psychiatric/Behavioral: Negative.     Per HPI unless specifically indicated above     Objective:    BP 117/74 (BP Location: Left Arm, Patient Position: Sitting, Cuff Size: Normal)   Pulse 65   Temp 98.3 F (36.8 C) (Oral)   Wt 240 lb  (108.9 kg)   SpO2 96%   BMI 36.49 kg/m   Wt Readings from Last 3 Encounters:  08/27/19 240 lb (108.9 kg)  04/29/19 235 lb (106.6 kg)  04/02/19 238 lb (108 kg)    Physical Exam Vitals and nursing note reviewed.  Constitutional:      General: He is not in acute distress.    Appearance: Normal appearance. He is not ill-appearing, toxic-appearing or diaphoretic.  HENT:     Head: Normocephalic and atraumatic.     Right Ear: External ear normal.     Left Ear: External ear normal.     Nose: Nose normal.     Mouth/Throat:     Mouth: Mucous membranes are moist.     Pharynx: Oropharynx is clear.  Eyes:     General: No scleral icterus.       Right eye: No discharge.        Left eye: No discharge.     Extraocular Movements: Extraocular movements intact.     Conjunctiva/sclera: Conjunctivae normal.     Pupils: Pupils are equal, round, and reactive to light.  Cardiovascular:     Rate and Rhythm: Normal rate and regular rhythm.     Pulses: Normal pulses.     Heart sounds: Normal heart sounds. No murmur heard.  No friction rub. No gallop.   Pulmonary:     Effort: Pulmonary effort is normal. No respiratory distress.     Breath sounds: Normal breath sounds. No stridor. No wheezing, rhonchi or rales.  Chest:     Chest wall: No tenderness.  Musculoskeletal:  General: Normal range of motion.     Cervical back: Normal range of motion and neck supple.  Skin:    General: Skin is warm and dry.     Capillary Refill: Capillary refill takes less than 2 seconds.     Coloration: Skin is not jaundiced or pale.     Findings: No bruising, erythema, lesion or rash.  Neurological:     General: No focal deficit present.     Mental Status: He is alert and oriented to person, place, and time. Mental status is at baseline.  Psychiatric:        Mood and Affect: Mood normal.        Behavior: Behavior normal.        Thought Content: Thought content normal.        Judgment: Judgment normal.      Results for orders placed or performed in visit on 08/27/19  CBC with Differential/Platelet  Result Value Ref Range   WBC 5.4 3.4 - 10.8 x10E3/uL   RBC 4.73 4.14 - 5.80 x10E6/uL   Hemoglobin 14.6 13.0 - 17.7 g/dL   Hematocrit 41.3 37.5 - 51.0 %   MCV 87 79 - 97 fL   MCH 30.9 26.6 - 33.0 pg   MCHC 35.4 31 - 35 g/dL   RDW 13.8 11.6 - 15.4 %   Platelets 143 (L) 150 - 450 x10E3/uL   Neutrophils 69 Not Estab. %   Lymphs 21 Not Estab. %   Monocytes 8 Not Estab. %   Eos 2 Not Estab. %   Basos 0 Not Estab. %   Neutrophils Absolute 3.7 1 - 7 x10E3/uL   Lymphocytes Absolute 1.1 0 - 3 x10E3/uL   Monocytes Absolute 0.5 0 - 0 x10E3/uL   EOS (ABSOLUTE) 0.1 0.0 - 0.4 x10E3/uL   Basophils Absolute 0.0 0 - 0 x10E3/uL   Immature Granulocytes 0 Not Estab. %   Immature Grans (Abs) 0.0 0.0 - 0.1 x10E3/uL  Comprehensive metabolic panel  Result Value Ref Range   Glucose 94 65 - 99 mg/dL   BUN 15 8 - 27 mg/dL   Creatinine, Ser 1.17 0.76 - 1.27 mg/dL   GFR calc non Af Amer 62 >59 mL/min/1.73   GFR calc Af Amer 72 >59 mL/min/1.73   BUN/Creatinine Ratio 13 10 - 24   Sodium 138 134 - 144 mmol/L   Potassium 4.7 3.5 - 5.2 mmol/L   Chloride 105 96 - 106 mmol/L   CO2 21 20 - 29 mmol/L   Calcium 9.1 8.6 - 10.2 mg/dL   Total Protein 6.0 6.0 - 8.5 g/dL   Albumin 4.2 3.7 - 4.7 g/dL   Globulin, Total 1.8 1.5 - 4.5 g/dL   Albumin/Globulin Ratio 2.3 (H) 1.2 - 2.2   Bilirubin Total 0.3 0.0 - 1.2 mg/dL   Alkaline Phosphatase 54 48 - 121 IU/L   AST 19 0 - 40 IU/L   ALT 27 0 - 44 IU/L  Lipid Panel w/o Chol/HDL Ratio  Result Value Ref Range   Cholesterol, Total 150 100 - 199 mg/dL   Triglycerides 103 0 - 149 mg/dL   HDL 36 (L) >39 mg/dL   VLDL Cholesterol Cal 19 5 - 40 mg/dL   LDL Chol Calc (NIH) 95 0 - 99 mg/dL  Microalbumin, Urine Waived  Result Value Ref Range   Microalb, Ur Waived 10 0 - 19 mg/L   Creatinine, Urine Waived 100 10 - 300 mg/dL   Microalb/Creat Ratio <30 <30 mg/g  PSA  Result  Value Ref Range   Prostate Specific Ag, Serum 2.2 0.0 - 4.0 ng/mL      Assessment & Plan:   Problem List Items Addressed This Visit      Cardiovascular and Mediastinum   Essential hypertension - Primary    Under good control on current regimen. Continue current regimen. Continue to monitor. Call with any concerns. Refills given. Labs drawn today.       Relevant Orders   CBC with Differential/Platelet (Completed)   Comprehensive metabolic panel (Completed)   Microalbumin, Urine Waived (Completed)     Genitourinary   Benign prostatic hyperplasia with urinary obstruction    Under good control on current regimen. Continue current regimen. Continue to monitor. Call with any concerns. Refills given. Labs drawn today.      Relevant Orders   CBC with Differential/Platelet (Completed)   Comprehensive metabolic panel (Completed)   PSA (Completed)   CKD (chronic kidney disease) stage 3, GFR 30-59 ml/min    Rechecking labs today. Await results. Call with any concerns.       Relevant Orders   CBC with Differential/Platelet (Completed)   Comprehensive metabolic panel (Completed)   Microalbumin, Urine Waived (Completed)    Other Visit Diagnoses    Screening for cholesterol level       Labs drawn today. Await results. Treat as needed.    Relevant Orders   Lipid Panel w/o Chol/HDL Ratio (Completed)       Follow up plan: Return in about 6 months (around 02/27/2020).

## 2019-08-28 LAB — CBC WITH DIFFERENTIAL/PLATELET
Basophils Absolute: 0 10*3/uL (ref 0.0–0.2)
Basos: 0 %
EOS (ABSOLUTE): 0.1 10*3/uL (ref 0.0–0.4)
Eos: 2 %
Hematocrit: 41.3 % (ref 37.5–51.0)
Hemoglobin: 14.6 g/dL (ref 13.0–17.7)
Immature Grans (Abs): 0 10*3/uL (ref 0.0–0.1)
Immature Granulocytes: 0 %
Lymphocytes Absolute: 1.1 10*3/uL (ref 0.7–3.1)
Lymphs: 21 %
MCH: 30.9 pg (ref 26.6–33.0)
MCHC: 35.4 g/dL (ref 31.5–35.7)
MCV: 87 fL (ref 79–97)
Monocytes Absolute: 0.5 10*3/uL (ref 0.1–0.9)
Monocytes: 8 %
Neutrophils Absolute: 3.7 10*3/uL (ref 1.4–7.0)
Neutrophils: 69 %
Platelets: 143 10*3/uL — ABNORMAL LOW (ref 150–450)
RBC: 4.73 x10E6/uL (ref 4.14–5.80)
RDW: 13.8 % (ref 11.6–15.4)
WBC: 5.4 10*3/uL (ref 3.4–10.8)

## 2019-08-28 LAB — COMPREHENSIVE METABOLIC PANEL
ALT: 27 IU/L (ref 0–44)
AST: 19 IU/L (ref 0–40)
Albumin/Globulin Ratio: 2.3 — ABNORMAL HIGH (ref 1.2–2.2)
Albumin: 4.2 g/dL (ref 3.7–4.7)
Alkaline Phosphatase: 54 IU/L (ref 48–121)
BUN/Creatinine Ratio: 13 (ref 10–24)
BUN: 15 mg/dL (ref 8–27)
Bilirubin Total: 0.3 mg/dL (ref 0.0–1.2)
CO2: 21 mmol/L (ref 20–29)
Calcium: 9.1 mg/dL (ref 8.6–10.2)
Chloride: 105 mmol/L (ref 96–106)
Creatinine, Ser: 1.17 mg/dL (ref 0.76–1.27)
GFR calc Af Amer: 72 mL/min/{1.73_m2} (ref >59)
GFR calc non Af Amer: 62 mL/min/{1.73_m2} (ref >59)
Globulin, Total: 1.8 g/dL (ref 1.5–4.5)
Glucose: 94 mg/dL (ref 65–99)
Potassium: 4.7 mmol/L (ref 3.5–5.2)
Sodium: 138 mmol/L (ref 134–144)
Total Protein: 6 g/dL (ref 6.0–8.5)

## 2019-08-28 LAB — LIPID PANEL W/O CHOL/HDL RATIO
Cholesterol, Total: 150 mg/dL (ref 100–199)
HDL: 36 mg/dL — ABNORMAL LOW (ref >39)
LDL Chol Calc (NIH): 95 mg/dL (ref 0–99)
Triglycerides: 103 mg/dL (ref 0–149)
VLDL Cholesterol Cal: 19 mg/dL (ref 5–40)

## 2019-08-28 LAB — PSA: Prostate Specific Ag, Serum: 2.2 ng/mL (ref 0.0–4.0)

## 2019-08-28 NOTE — Assessment & Plan Note (Signed)
Under good control on current regimen. Continue current regimen. Continue to monitor. Call with any concerns. Refills given. Labs drawn today.   

## 2019-08-28 NOTE — Assessment & Plan Note (Signed)
Rechecking labs today. Await results. Call with any concerns.  

## 2019-10-21 ENCOUNTER — Ambulatory Visit (INDEPENDENT_AMBULATORY_CARE_PROVIDER_SITE_OTHER): Payer: No Typology Code available for payment source | Admitting: Dermatology

## 2019-10-21 ENCOUNTER — Other Ambulatory Visit: Payer: Self-pay

## 2019-10-21 DIAGNOSIS — L570 Actinic keratosis: Secondary | ICD-10-CM

## 2019-10-21 DIAGNOSIS — L821 Other seborrheic keratosis: Secondary | ICD-10-CM

## 2019-10-21 DIAGNOSIS — L578 Other skin changes due to chronic exposure to nonionizing radiation: Secondary | ICD-10-CM

## 2019-10-21 DIAGNOSIS — B351 Tinea unguium: Secondary | ICD-10-CM | POA: Diagnosis not present

## 2019-10-21 DIAGNOSIS — B353 Tinea pedis: Secondary | ICD-10-CM

## 2019-10-21 DIAGNOSIS — L820 Inflamed seborrheic keratosis: Secondary | ICD-10-CM | POA: Diagnosis not present

## 2019-10-21 MED ORDER — TERBINAFINE HCL 250 MG PO TABS: 250.0000 mg | ORAL_TABLET | Freq: Every day | ORAL | 0 refills | Status: DC

## 2019-10-21 NOTE — Progress Notes (Signed)
   Follow-Up Visit   Subjective  Logan Willis is a 74 y.o. male who presents for the following: tinea pedis and unguium (S/P Lamisil 250mg  po QD x 31mths and Ketoconazole 2% cream QHS) and recheck AK (R ear - and recheck sun exposed areas for new or persistent lesion).  The following portions of the chart were reviewed this encounter and updated as appropriate:  Tobacco  Allergies  Meds  Problems  Med Hx  Surg Hx  Fam Hx     Review of Systems:  No other skin or systemic complaints except as noted in HPI or Assessment and Plan.  Objective  Well appearing patient in no apparent distress; mood and affect are within normal limits.  A focused examination was performed including the face, scalp, feet, arms and hands. Relevant physical exam findings are noted in the Assessment and Plan.  Objective  B/L foot: Scaling and maceration web spaces and over distal and lateral soles.   Objective  Scalp, forehead (2): Erythematous keratotic or waxy stuck-on papule or plaque.   Objective  Face and ears x 9 (9): Erythematous thin papules/macules with gritty scale.   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 Continue Lamisil 250mg  po QD, and Ketoconazole 2% cream QHS. Patient tolerated treatment well previously, and has no hx of liver or kidney issues.   Inflamed seborrheic keratosis (2) Scalp, forehead  Destruction of lesion - Scalp, forehead Complexity: simple   Destruction method: cryotherapy   Informed consent: discussed and consent obtained   Timeout:  patient name, date of birth, surgical site, and procedure verified Lesion destroyed using liquid nitrogen: Yes   Region frozen until ice ball extended beyond lesion: Yes   Outcome: patient tolerated procedure well with no complications   Post-procedure details: wound care instructions given    AK (actinic keratosis) (9) Face  and ears x 9  Destruction of lesion - Face and ears x 9 Complexity: simple   Destruction method: cryotherapy   Informed consent: discussed and consent obtained   Timeout:  patient name, date of birth, surgical site, and procedure verified Lesion destroyed using liquid nitrogen: Yes   Region frozen until ice ball extended beyond lesion: Yes   Outcome: patient tolerated procedure well with no complications   Post-procedure details: wound care instructions given    Tinea unguium B/L foot Continue Lamisil 250mg  po QD and Ketoconazole 2% cream QHS. Patient tolerated treatment well previously, and has no history of liver or kidney issues.   terbinafine (LAMISIL) 250 MG tablet - B/L foot   Actinic Damage - diffuse scaly erythematous macules with underlying dyspigmentation - Recommend daily broad spectrum sunscreen SPF 30+ to sun-exposed areas, reapply every 2 hours as needed.  - Call for new or changing lesions.  Seborrheic Keratoses - Stuck-on, waxy, tan-brown papules and plaques  - Discussed benign etiology and prognosis. - Observe - Call for any changes  Return in about 6 months (around 04/20/2020).  Luther Redo, CMA, am acting as scribe for Sarina Ser, MD .  Documentation: I have reviewed the above documentation for accuracy and completeness, and I agree with the above.  Sarina Ser, MD

## 2019-10-22 ENCOUNTER — Encounter: Payer: Self-pay | Admitting: Dermatology

## 2020-03-03 ENCOUNTER — Ambulatory Visit (INDEPENDENT_AMBULATORY_CARE_PROVIDER_SITE_OTHER): Payer: No Typology Code available for payment source | Admitting: Nurse Practitioner

## 2020-03-03 ENCOUNTER — Other Ambulatory Visit: Payer: Self-pay

## 2020-03-03 ENCOUNTER — Encounter: Payer: Self-pay | Admitting: Nurse Practitioner

## 2020-03-03 VITALS — BP 120/77 | HR 69 | Temp 98.1°F | Ht 69.0 in | Wt 238.0 lb

## 2020-03-03 DIAGNOSIS — I10 Essential (primary) hypertension: Secondary | ICD-10-CM | POA: Diagnosis not present

## 2020-03-03 DIAGNOSIS — K2100 Gastro-esophageal reflux disease with esophagitis, without bleeding: Secondary | ICD-10-CM | POA: Insufficient documentation

## 2020-03-03 DIAGNOSIS — Z0000 Encounter for general adult medical examination without abnormal findings: Secondary | ICD-10-CM

## 2020-03-03 DIAGNOSIS — N401 Enlarged prostate with lower urinary tract symptoms: Secondary | ICD-10-CM | POA: Diagnosis not present

## 2020-03-03 DIAGNOSIS — N138 Other obstructive and reflux uropathy: Secondary | ICD-10-CM

## 2020-03-03 DIAGNOSIS — N1831 Chronic kidney disease, stage 3a: Secondary | ICD-10-CM

## 2020-03-03 DIAGNOSIS — Z6835 Body mass index (BMI) 35.0-35.9, adult: Secondary | ICD-10-CM

## 2020-03-03 DIAGNOSIS — B001 Herpesviral vesicular dermatitis: Secondary | ICD-10-CM

## 2020-03-03 LAB — MICROALBUMIN, URINE WAIVED
Creatinine, Urine Waived: 100 mg/dL (ref 10–300)
Microalb, Ur Waived: 10 mg/L (ref 0–19)
Microalb/Creat Ratio: <30 mg/g (ref <30)

## 2020-03-03 MED ORDER — TAMSULOSIN HCL 0.4 MG PO CAPS: 0.4000 mg | ORAL_CAPSULE | Freq: Every day | ORAL | 4 refills | Status: DC

## 2020-03-03 MED ORDER — VALACYCLOVIR HCL 500 MG PO TABS: 1000.0000 mg | ORAL_TABLET | Freq: Two times a day (BID) | ORAL | 12 refills | Status: DC

## 2020-03-03 MED ORDER — BENAZEPRIL HCL 20 MG PO TABS: 20.0000 mg | ORAL_TABLET | Freq: Every day | ORAL | 4 refills | Status: DC

## 2020-03-03 NOTE — Assessment & Plan Note (Signed)
BMI 35.15.  Recommended eating smaller high protein, low fat meals more frequently and exercising 30 mins a day 5 times a week with a goal of 10-15lb weight loss in the next 3 months. Patient voiced their understanding and motivation to adhere to these recommendations. ° °

## 2020-03-03 NOTE — Assessment & Plan Note (Signed)
Noted on February 2021 labs with GFR 56.  Recheck CMP today and urine ALB, continue Benazepril for kidney protection.  If worsening in future consider nephrology referral.

## 2020-03-03 NOTE — Assessment & Plan Note (Signed)
Chronic, stable with BP below goal today.  Recommend he check BP at least weekly at home and document.  Continue current medication regimen and adjust as needed.  Labs obtained today and refills sent.  Advised him not to take Allegra D, but instead take plain Allegra due to possibility of elevation in BP with addition of D.  Return in 6 months.

## 2020-03-03 NOTE — Progress Notes (Signed)
BP 120/77   Pulse 69   Temp 98.1 F (36.7 C) (Oral)   Ht 5\' 9"  (1.753 m)   Wt 238 lb (108 kg)   SpO2 96%   BMI 35.15 kg/m    Subjective:    Patient ID: Logan Willis, male    DOB: 07/14/1945, 75 y.o.   MRN: [id number]  HPI: Logan Willis is a 75 y.o. male presenting on 03/03/2020 for comprehensive medical examination. Current medical complaints include:none  He currently lives with: wife Interim Problems from his last visit: no   HYPERTENSION Continues on Benazepril 20 MG daily.  Has never taken statin.   Hypertension status: stable  Satisfied with current treatment? yes Duration of hypertension: chronic BP monitoring frequency:  not checking BP range:  BP medication side effects:  no Medication compliance: good compliance Previous BP meds:Benazepril Aspirin: no Recurrent headaches: no Visual changes: no Palpitations: no Dyspnea: no Chest pain: no Lower extremity edema: no Dizzy/lightheaded: no  The 10-year ASCVD risk score Mikey Bussing DC Jr., et al., 2013) is: 21.5%   Values used to calculate the score:     Age: 16 years     Sex: Male     Is Non-Hispanic African American: No     Diabetic: No     Tobacco smoker: No     Systolic Blood Pressure: 938 mmHg     Is BP treated: Yes     HDL Cholesterol: 36 mg/dL     Total Cholesterol: 150 mg/dL  BPH Continues on Flomax daily. BPH status: stable Satisfied with current treatment?: yes Medication side effects: no Medication compliance: good compliance Duration: chronic Nocturia: 1-2x per night Urinary frequency:no Incomplete voiding: no Urgency: no Weak urinary stream: no Straining to start stream: no Dysuria: no Onset: gradual Severity: mild   GERD Reports over past months some occasional reflux and belching with this.  Has not tried OTC medications or monitored diet. GERD control status: stable  Satisfied with current treatment? yes Heartburn frequency: a few times a month Previous GERD medications:  none Antacid use frequency:  none Nature: burning Location: epigastric Heartburn duration: 30 minutes Alleviatiating factors:  unknown Aggravating factors: different foods Dysphagia: no Odynophagia:  no Hematemesis: no Blood in stool: no EGD: no  Functional Status Survey: Is the patient deaf or have difficulty hearing?: No Does the patient have difficulty seeing, even when wearing glasses/contacts?: No Does the patient have difficulty concentrating, remembering, or making decisions?: No Does the patient have difficulty walking or climbing stairs?: No Does the patient have difficulty dressing or bathing?: No Does the patient have difficulty doing errands alone such as visiting a doctor's office or shopping?: No  FALL RISK: Fall Risk  03/03/2020 02/12/2019 02/05/2018 07/31/2017 01/29/2017  Falls in the past year? 0 0 0 No No  Number falls in past yr: 0 0 0 - -  Injury with Fall? 0 0 0 - -  Follow up Falls evaluation completed Falls evaluation completed - - -    Depression Screen Depression screen [facility] 2/9 03/03/2020 02/12/2019 02/05/2018 07/31/2017 01/29/2017  Decreased Interest 0 0 0 0 0  Down, Depressed, Hopeless 0 0 0 0 0  PHQ - 2 Score 0 0 0 0 0  Altered sleeping 0 - 1 - -  Tired, decreased energy 0 - 0 - -  Change in appetite 0 - 0 - -  Feeling bad or failure about yourself  0 - 0 - -  Trouble concentrating 0 - 0 - -  Moving slowly or fidgety/restless 0 - 0 - -  Suicidal thoughts 0 - 0 - -  PHQ-9 Score 0 - 1 - -  Difficult doing work/chores - - Not difficult at all - -    Advanced Directives <no information>  Past Medical History:  Past Medical History:  Diagnosis Date  . Actinic keratosis   . Body mass index 35.0-35.9, adult   . Cold sore   . Hypertension   . Kidney stone 08/2018    Surgical History:  Past Surgical History:  Procedure Laterality Date  . COLON SURGERY     colectomy 2008  . COLONOSCOPY    . COLONOSCOPY WITH PROPOFOL N/A 12/10/2014   Procedure:  COLONOSCOPY WITH PROPOFOL;  Surgeon: Lucilla Lame, MD;  Location: [place];  Service: Endoscopy;  Laterality: N/A;  . COLONOSCOPY WITH PROPOFOL N/A 04/29/2019   Procedure: COLONOSCOPY WITH PROPOFOL;  Surgeon: Lucilla Lame, MD;  Location: [facility] ENDOSCOPY;  Service: Endoscopy;  Laterality: N/A;  . EXTRACORPOREAL SHOCK WAVE LITHOTRIPSY Left 08/22/2018   Procedure: EXTRACORPOREAL SHOCK WAVE LITHOTRIPSY (ESWL);  Surgeon: Billey Co, MD;  Location: [facility];  Service: Urology;  Laterality: Left;  . HERNIA REPAIR      Medications:  Current Outpatient Medications on File Prior to Visit  Medication Sig  . fexofenadine-pseudoephedrine (ALLEGRA-D 24) 180-240 MG 24 hr tablet Take 1 tablet by mouth daily as needed.  Marland Kitchen ketoconazole (NIZORAL) 2 % cream Apply  a small amount to affected area at bedtime  to feet and between toes  . Simethicone 125 MG TABS Take 1 tablet (125 mg total) by mouth daily. Before dinner  . terbinafine (LAMISIL) 250 MG tablet Take 1 tablet (250 mg total) by mouth daily.  Marland Kitchen triamcinolone cream (KENALOG) 0.1 % Apply 1 application topically 2 (two) times daily.    No current facility-administered medications on file prior to visit.    Allergies:  No Known Allergies  Social History:  Social History   Socioeconomic History  . Marital status: Married    Spouse name: Not on file  . Number of children: Not on file  . Years of education: Not on file  . Highest education level: Not on file  Occupational History  . Not on file  Tobacco Use  . Smoking status: Never Smoker  . Smokeless tobacco: Never Used  Vaping Use  . Vaping Use: Never used  Substance and Sexual Activity  . Alcohol use: No  . Drug use: No  . Sexual activity: Yes    Birth control/protection: None  Other Topics Concern  . Not on file  Social History Narrative  . Not on file   Social Determinants of Health   Financial Resource Strain: Not on file  Food Insecurity: Not on file   Transportation Needs: Not on file  Physical Activity: Not on file  Stress: Not on file  Social Connections: Not on file  Intimate Partner Violence: Not on file   Social History   Tobacco Use  Smoking Status Never Smoker  Smokeless Tobacco Never Used   Social History   Substance and Sexual Activity  Alcohol Use No    Family History:  Family History  Problem Relation Age of Onset  . Heart disease Mother 76  . Heart disease Father 27  . Diabetes Sister   . Stroke Sister   . Cancer Neg Hx   . COPD Neg Hx     Past medical history, surgical history, medications, allergies, family history and social history  reviewed with patient today and changes made to appropriate areas of the chart.   Review of Systems - negative All other ROS negative except what is listed above and in the HPI.      Objective:    BP 120/77   Pulse 69   Temp 98.1 F (36.7 C) (Oral)   Ht 5\' 9"  (1.753 m)   Wt 238 lb (108 kg)   SpO2 96%   BMI 35.15 kg/m   Wt Readings from Last 3 Encounters:  03/03/20 238 lb (108 kg)  08/27/19 240 lb (108.9 kg)  04/29/19 235 lb (106.6 kg)    Physical Exam Vitals and nursing note reviewed.  Constitutional:      General: He is awake. He is not in acute distress.    Appearance: He is well-developed. He is obese. He is not ill-appearing.  HENT:     Head: Normocephalic and atraumatic.     Right Ear: Hearing, tympanic membrane, ear canal and external ear normal. No drainage.     Left Ear: Hearing, tympanic membrane, ear canal and external ear normal. No drainage.     Nose: Nose normal.     Mouth/Throat:     Pharynx: Uvula midline.  Eyes:     General: Lids are normal.        Right eye: No discharge.        Left eye: No discharge.     Extraocular Movements: Extraocular movements intact.     Conjunctiva/sclera: Conjunctivae normal.     Pupils: Pupils are equal, round, and reactive to light.     Visual Fields: Right eye visual fields normal and left eye visual  fields normal.  Neck:     Thyroid: No thyromegaly.     Vascular: No carotid bruit or JVD.     Trachea: Trachea normal.  Cardiovascular:     Rate and Rhythm: Normal rate and regular rhythm.     Heart sounds: Normal heart sounds, S1 normal and S2 normal. No murmur heard. No gallop.   Pulmonary:     Effort: Pulmonary effort is normal. No accessory muscle usage or respiratory distress.     Breath sounds: Normal breath sounds.  Abdominal:     General: Bowel sounds are normal.     Palpations: Abdomen is soft. There is no hepatomegaly or splenomegaly.     Tenderness: There is no abdominal tenderness.  Musculoskeletal:        General: Normal range of motion.     Cervical back: Normal range of motion and neck supple.     Right lower leg: No edema.     Left lower leg: No edema.  Lymphadenopathy:     Head:     Right side of head: No submental, submandibular, tonsillar, preauricular or posterior auricular adenopathy.     Left side of head: No submental, submandibular, tonsillar, preauricular or posterior auricular adenopathy.     Cervical: No cervical adenopathy.  Skin:    General: Skin is warm and dry.     Capillary Refill: Capillary refill takes less than 2 seconds.     Findings: No rash.  Neurological:     Mental Status: He is alert and oriented to person, place, and time.     Cranial Nerves: Cranial nerves are intact.     Gait: Gait is intact.     Deep Tendon Reflexes: Reflexes are normal and symmetric.     Reflex Scores:      Brachioradialis reflexes are 2+ on the  right side and 2+ on the left side.      Patellar reflexes are 2+ on the right side and 2+ on the left side. Psychiatric:        Attention and Perception: Attention normal.        Mood and Affect: Mood normal.        Speech: Speech normal.        Behavior: Behavior normal. Behavior is cooperative.        Thought Content: Thought content normal.        Cognition and Memory: Cognition normal.        Judgment: Judgment  normal.     Results for orders placed or performed in visit on 08/27/19  CBC with Differential/Platelet  Result Value Ref Range   WBC 5.4 3.4 - 10.8 x10E3/uL   RBC 4.73 4.14 - 5.80 x10E6/uL   Hemoglobin 14.6 13.0 - 17.7 g/dL   Hematocrit 41.3 37.5 - 51.0 %   MCV 87 79 - 97 fL   MCH 30.9 26.6 - 33.0 pg   MCHC 35.4 31.5 - 35.7 g/dL   RDW 13.8 11.6 - 15.4 %   Platelets 143 (L) 150 - 450 x10E3/uL   Neutrophils 69 Not Estab. %   Lymphs 21 Not Estab. %   Monocytes 8 Not Estab. %   Eos 2 Not Estab. %   Basos 0 Not Estab. %   Neutrophils Absolute 3.7 1.4 - 7.0 x10E3/uL   Lymphocytes Absolute 1.1 0.7 - 3.1 x10E3/uL   Monocytes Absolute 0.5 0.1 - 0.9 x10E3/uL   EOS (ABSOLUTE) 0.1 0.0 - 0.4 x10E3/uL   Basophils Absolute 0.0 0.0 - 0.2 x10E3/uL   Immature Granulocytes 0 Not Estab. %   Immature Grans (Abs) 0.0 0.0 - 0.1 x10E3/uL  Comprehensive metabolic panel  Result Value Ref Range   Glucose 94 65 - 99 mg/dL   BUN 15 8 - 27 mg/dL   Creatinine, Ser 1.17 0.76 - 1.27 mg/dL   GFR calc non Af Amer 62 >59 mL/min/1.73   GFR calc Af Amer 72 >59 mL/min/1.73   BUN/Creatinine Ratio 13 10 - 24   Sodium 138 134 - 144 mmol/L   Potassium 4.7 3.5 - 5.2 mmol/L   Chloride 105 96 - 106 mmol/L   CO2 21 20 - 29 mmol/L   Calcium 9.1 8.6 - 10.2 mg/dL   Total Protein 6.0 6.0 - 8.5 g/dL   Albumin 4.2 3.7 - 4.7 g/dL   Globulin, Total 1.8 1.5 - 4.5 g/dL   Albumin/Globulin Ratio 2.3 (H) 1.2 - 2.2   Bilirubin Total 0.3 0.0 - 1.2 mg/dL   Alkaline Phosphatase 54 48 - 121 IU/L   AST 19 0 - 40 IU/L   ALT 27 0 - 44 IU/L  Lipid Panel w/o Chol/HDL Ratio  Result Value Ref Range   Cholesterol, Total 150 100 - 199 mg/dL   Triglycerides 103 0 - 149 mg/dL   HDL 36 (L) >39 mg/dL   VLDL Cholesterol Cal 19 5 - 40 mg/dL   LDL Chol Calc (NIH) 95 0 - 99 mg/dL  Microalbumin, Urine Waived  Result Value Ref Range   Microalb, Ur Waived 10 0 - 19 mg/L   Creatinine, Urine Waived 100 10 - 300 mg/dL   Microalb/Creat Ratio  <30 <30 mg/g  PSA  Result Value Ref Range   Prostate Specific Ag, Serum 2.2 0.0 - 4.0 ng/mL      Assessment & Plan:   Problem List Items Addressed This  Visit      Cardiovascular and Mediastinum   Essential hypertension    Chronic, stable with BP below goal today.  Recommend he check BP at least weekly at home and document.  Continue current medication regimen and adjust as needed.  Labs obtained today and refills sent.  Advised him not to take Allegra D, but instead take plain Allegra due to possibility of elevation in BP with addition of D.  Return in 6 months.      Relevant Medications   benazepril (LOTENSIN) 20 MG tablet   Other Relevant Orders   CBC with Differential/Platelet   Comprehensive metabolic panel   Lipid Panel w/o Chol/HDL Ratio   TSH   Microalbumin, Urine Waived     Digestive   Cold sore    Refills on Valtrex sent, only occasional outbreaks.      Relevant Medications   valACYclovir (VALTREX) 500 MG tablet   Reflux esophagitis    Occasionally present, recommend keeping food journal and attempting to avoid foods that trigger symptoms.  If needed may trial OTC Pepcid 30 minutes prior to meals that may cause irritation.  Educated on GERD.  If worsening symptoms return to office.        Genitourinary   Benign prostatic hyperplasia with urinary obstruction    Chronic, stable with minimal symptoms.  Check PSA today.  Continue current medication regimen and adjust as needed.  Return in 6 months.      Relevant Medications   tamsulosin (FLOMAX) 0.4 MG CAPS capsule   Other Relevant Orders   PSA   CKD (chronic kidney disease) stage 3, GFR 30-59 ml/min (HCC) - Primary    Noted on February 2021 labs with GFR 56.  Recheck CMP today and urine ALB, continue Benazepril for kidney protection.  If worsening in future consider nephrology referral.      Relevant Orders   CBC with Differential/Platelet   Microalbumin, Urine Waived     Other   Body mass index 35.0-35.9,  adult    BMI 35.15.  Recommended eating smaller high protein, low fat meals more frequently and exercising 30 mins a day 5 times a week with a goal of 10-15lb weight loss in the next 3 months. Patient voiced their understanding and motivation to adhere to these recommendations.        Other Visit Diagnoses    Annual physical exam       Annual labs today to include CBC, CMP, TSH, lipid, PSA      Discussed aspirin prophylaxis for myocardial infarction prevention and decision was it was not indicated  LABORATORY TESTING:  Health maintenance labs ordered today as discussed above.   The natural history of prostate cancer and ongoing controversy regarding screening and potential treatment outcomes of prostate cancer has been discussed with the patient. The meaning of a false positive PSA and a false negative PSA has been discussed. He indicates understanding of the limitations of this screening test and wishes to proceed with screening PSA testing.   IMMUNIZATIONS:   - Tdap: Tetanus vaccination status reviewed: last tetanus booster within 10 years. - Influenza: Up to date - Pneumovax: Up to date - Prevnar: Up to date - Zostavax vaccine: Up to date  SCREENING: - Colonoscopy: up to date Discussed with patient purpose of the colonoscopy is to detect colon cancer at curable precancerous or early stages   - AAA Screening: Not applicable  -Hearing Test: Not applicable  -Spirometry: Not applicable   PATIENT COUNSELING:  Sexuality: Discussed sexually transmitted diseases, partner selection, use of condoms, avoidance of unintended pregnancy  and contraceptive alternatives.   Advised to avoid cigarette smoking.  I discussed with the patient that most people either abstain from alcohol or drink within safe limits (<=14/week and <=4 drinks/occasion for males, <=7/weeks and <= 3 drinks/occasion for females) and that the risk for alcohol disorders and other health effects rises proportionally  with the number of drinks per week and how often a drinker exceeds daily limits.  Discussed cessation/primary prevention of drug use and availability of treatment for abuse.   Diet: Encouraged to adjust caloric intake to maintain  or achieve ideal body weight, to reduce intake of dietary saturated fat and total fat, to limit sodium intake by avoiding high sodium foods and not adding table salt, and to maintain adequate dietary potassium and calcium preferably from fresh fruits, vegetables, and low-fat dairy products.    stressed the importance of regular exercise  Injury prevention: Discussed safety belts, safety helmets, smoke detector, smoking near bedding or upholstery.   Dental health: Discussed importance of regular tooth brushing, flossing, and dental visits.   Follow up plan: NEXT PREVENTATIVE PHYSICAL DUE IN 1 YEAR. Return in about 6 months (around 08/31/2020) for HTN, BPH, GERD.

## 2020-03-03 NOTE — Assessment & Plan Note (Signed)
Chronic, stable with minimal symptoms.  Check PSA today.  Continue current medication regimen and adjust as needed.  Return in 6 months.

## 2020-03-03 NOTE — Assessment & Plan Note (Signed)
Occasionally present, recommend keeping food journal and attempting to avoid foods that trigger symptoms.  If needed may trial OTC Pepcid 30 minutes prior to meals that may cause irritation.  Educated on GERD.  If worsening symptoms return to office.

## 2020-03-03 NOTE — Patient Instructions (Signed)
Food Choices for Gastroesophageal Reflux Disease, Adult When you have gastroesophageal reflux disease (GERD), the foods you eat and your eating habits are very important. Choosing the right foods can help ease your discomfort. Think about working with a food expert (dietitian) to help you make good choices. What are tips for following this plan? Reading food labels  Look for foods that are low in saturated fat. Foods that may help with your symptoms include: ? Foods that have less than 5% of daily value (DV) of fat. ? Foods that have 0 grams of trans fat. Cooking  Do not fry your food.  Cook your food by baking, steaming, grilling, or broiling. These are all methods that do not need a lot of fat for cooking.  To add flavor, try to use herbs that are low in spice and acidity. Meal planning  Choose healthy foods that are low in fat, such as: ? Fruits and vegetables. ? Whole grains. ? Low-fat dairy products. ? Lean meats, fish, and poultry.  Eat small meals often instead of eating 3 large meals each day. Eat your meals slowly in a place where you are relaxed. Avoid bending over or lying down until 2-3 hours after eating.  Limit high-fat foods such as fatty meats or fried foods.  Limit your intake of fatty foods, such as oils, butter, and shortening.  Avoid the following as told by your doctor: ? Foods that cause symptoms. These may be different for different people. Keep a food diary to keep track of foods that cause symptoms. ? Alcohol. ? Drinking a lot of liquid with meals. ? Eating meals during the 2-3 hours before bed.   Lifestyle  Stay at a healthy weight. Ask your doctor what weight is healthy for you. If you need to lose weight, work with your doctor to do so safely.  Exercise for at least 30 minutes on 5 or more days each week, or as told by your doctor.  Wear loose-fitting clothes.  Do not smoke or use any products that contain nicotine or tobacco. If you need help  quitting, ask your doctor.  Sleep with the head of your bed higher than your feet. Use a wedge under the mattress or blocks under the bed frame to raise the head of the bed.  Chew sugar-free gum after meals. What foods should eat? Eat a healthy, well-balanced diet of fruits, vegetables, whole grains, low-fat dairy products, lean meats, fish, and poultry. Each person is different. Foods that may cause symptoms in one person may not cause any symptoms in another person. Work with your doctor to find foods that are safe for you. The items listed above may not be a complete list of what you can eat and drink. Contact a food expert for more options.   What foods should I avoid? Limiting some of these foods may help in managing the symptoms of GERD. Everyone is different. Talk with a food expert or your doctor to help you find the exact foods to avoid, if any. Fruits Any fruits prepared with added fat. Any fruits that cause symptoms. For some people, this may include citrus fruits, such as oranges, grapefruit, pineapple, and lemons. Vegetables Deep-fried vegetables. French fries. Any vegetables prepared with added fat. Any vegetables that cause symptoms. For some people, this may include tomatoes and tomato products, chili peppers, onions and garlic, and horseradish. Grains Pastries or quick breads with added fat. Meats and other proteins High-fat meats, such as fatty beef or pork,   hot dogs, ribs, ham, sausage, salami, and bacon. Fried meat or protein, including fried fish and fried chicken. Nuts and nut butters, in large amounts. Dairy Whole milk and chocolate milk. Sour cream. Cream. Ice cream. Cream cheese. Milkshakes. Fats and oils Butter. Margarine. Shortening. Ghee. Beverages Coffee and tea, with or without caffeine. Carbonated beverages. Sodas. Energy drinks. Fruit juice made with acidic fruits, such as orange or grapefruit. Tomato juice. Alcoholic drinks. Sweets and desserts Chocolate and  cocoa. Donuts. Seasonings and condiments Pepper. Peppermint and spearmint. Added salt. Any condiments, herbs, or seasonings that cause symptoms. For some people, this may include curry, hot sauce, or vinegar-based salad dressings. The items listed above may not be a complete list of what you should not eat and drink. Contact a food expert for more options. Questions to ask your doctor Diet and lifestyle changes are often the first steps that are taken to manage symptoms of GERD. If diet and lifestyle changes do not help, talk with your doctor about taking medicines. Where to find more information  [employer] G[employer]intestinal [employer]: aboutgerd.org Summary  When you have GERD, food and lifestyle choices are very important in easing your symptoms.  Eat small meals often instead of 3 large meals a day. Eat your meals slowly and in a place where you are relaxed.  Avoid bending over or lying down until 2-3 hours after eating.  Limit high-fat foods such as fatty meats or fried foods. This information is not intended to replace advice given to you by your health care provider. Make sure you discuss any questions you have with your health care provider. Document Revised: 07/07/2019 Document Reviewed: 07/07/2019 Elsevier Patient Education  2021 Elsevier Inc.  

## 2020-03-03 NOTE — Assessment & Plan Note (Signed)
Refills on Valtrex sent, only occasional outbreaks.

## 2020-03-04 ENCOUNTER — Other Ambulatory Visit: Payer: Self-pay | Admitting: Nurse Practitioner

## 2020-03-04 LAB — CBC WITH DIFFERENTIAL/PLATELET
Basophils Absolute: 0 10*3/uL (ref 0.0–0.2)
Basos: 1 %
EOS (ABSOLUTE): 0.1 10*3/uL (ref 0.0–0.4)
Eos: 2 %
Hematocrit: 47 % (ref 37.5–51.0)
Hemoglobin: 15.8 g/dL (ref 13.0–17.7)
Immature Grans (Abs): 0 10*3/uL (ref 0.0–0.1)
Immature Granulocytes: 0 %
Lymphocytes Absolute: 0.9 10*3/uL (ref 0.7–3.1)
Lymphs: 21 %
MCH: 29.1 pg (ref 26.6–33.0)
MCHC: 33.6 g/dL (ref 31.5–35.7)
MCV: 87 fL (ref 79–97)
Monocytes Absolute: 0.5 10*3/uL (ref 0.1–0.9)
Monocytes: 11 %
Neutrophils Absolute: 2.9 10*3/uL (ref 1.4–7.0)
Neutrophils: 65 %
Platelets: 167 10*3/uL (ref 150–450)
RBC: 5.43 x10E6/uL (ref 4.14–5.80)
RDW: 13.9 % (ref 11.6–15.4)
WBC: 4.4 10*3/uL (ref 3.4–10.8)

## 2020-03-04 LAB — COMPREHENSIVE METABOLIC PANEL
ALT: 31 IU/L (ref 0–44)
AST: 19 IU/L (ref 0–40)
Albumin/Globulin Ratio: 2.1 (ref 1.2–2.2)
Albumin: 4.4 g/dL (ref 3.7–4.7)
Alkaline Phosphatase: 57 IU/L (ref 44–121)
BUN/Creatinine Ratio: 18 (ref 10–24)
BUN: 20 mg/dL (ref 8–27)
Bilirubin Total: 0.4 mg/dL (ref 0.0–1.2)
CO2: 22 mmol/L (ref 20–29)
Calcium: 9.4 mg/dL (ref 8.6–10.2)
Chloride: 102 mmol/L (ref 96–106)
Creatinine, Ser: 1.1 mg/dL (ref 0.76–1.27)
GFR calc Af Amer: 77 mL/min/{1.73_m2} (ref >59)
GFR calc non Af Amer: 67 mL/min/{1.73_m2} (ref >59)
Globulin, Total: 2.1 g/dL (ref 1.5–4.5)
Glucose: 69 mg/dL (ref 65–99)
Potassium: 4.4 mmol/L (ref 3.5–5.2)
Sodium: 139 mmol/L (ref 134–144)
Total Protein: 6.5 g/dL (ref 6.0–8.5)

## 2020-03-04 LAB — LIPID PANEL W/O CHOL/HDL RATIO
Cholesterol, Total: 159 mg/dL (ref 100–199)
HDL: 42 mg/dL (ref >39)
LDL Chol Calc (NIH): 102 mg/dL — ABNORMAL HIGH (ref 0–99)
Triglycerides: 79 mg/dL (ref 0–149)
VLDL Cholesterol Cal: 15 mg/dL (ref 5–40)

## 2020-03-04 LAB — TSH: TSH: 1.81 u[IU]/mL (ref 0.450–4.500)

## 2020-03-04 LAB — PSA: Prostate Specific Ag, Serum: 2.6 ng/mL (ref 0.0–4.0)

## 2020-03-04 MED ORDER — ATORVASTATIN CALCIUM 10 MG PO TABS: 10.0000 mg | ORAL_TABLET | Freq: Every day | ORAL | 3 refills | Status: DC

## 2020-03-04 NOTE — Progress Notes (Signed)
Contacted via MyChart The 10-year ASCVD risk score Mikey Bussing DC Jr., et al., 2013) is: 20.8%   Values used to calculate the score:     Age: 75 years     Sex: Male     Is Non-Hispanic African American: No     Diabetic: No     Tobacco smoker: No     Systolic Blood Pressure: 622 mmHg     Is BP treated: Yes     HDL Cholesterol: 42 mg/dL     Total Cholesterol: 159 mg/dL   Good afternoon Logan Willis, your labs have returned and overall everything looks normal with exception of cholesterol levels.  Your LDL is elevated placing you at higher risk for stroke or heart attack.  I would recommend starting statin medication and will send this in.  Main side effects we see with this are muscle pains or fatigue, if these present let me know.  I would like you to come in 8 weeks for lab visit only to recheck labs fasting with you on medication.  Any questions? Keep being awesome!!  Thank you for allowing me to participate in your care. Kindest regards, Wadie Mattie

## 2020-04-21 ENCOUNTER — Other Ambulatory Visit: Payer: Self-pay

## 2020-04-21 ENCOUNTER — Ambulatory Visit (INDEPENDENT_AMBULATORY_CARE_PROVIDER_SITE_OTHER): Payer: No Typology Code available for payment source | Admitting: Dermatology

## 2020-04-21 ENCOUNTER — Encounter: Payer: Self-pay | Admitting: Dermatology

## 2020-04-21 DIAGNOSIS — Z1283 Encounter for screening for malignant neoplasm of skin: Secondary | ICD-10-CM

## 2020-04-21 DIAGNOSIS — L570 Actinic keratosis: Secondary | ICD-10-CM

## 2020-04-21 DIAGNOSIS — L578 Other skin changes due to chronic exposure to nonionizing radiation: Secondary | ICD-10-CM

## 2020-04-21 DIAGNOSIS — B351 Tinea unguium: Secondary | ICD-10-CM

## 2020-04-21 DIAGNOSIS — L814 Other melanin hyperpigmentation: Secondary | ICD-10-CM

## 2020-04-21 DIAGNOSIS — L820 Inflamed seborrheic keratosis: Secondary | ICD-10-CM

## 2020-04-21 DIAGNOSIS — D229 Melanocytic nevi, unspecified: Secondary | ICD-10-CM

## 2020-04-21 DIAGNOSIS — D1800 Hemangioma unspecified site: Secondary | ICD-10-CM

## 2020-04-21 MED ORDER — TERBINAFINE HCL 250 MG PO TABS: ORAL_TABLET | ORAL | 1 refills | Status: DC

## 2020-04-21 MED ORDER — KETOCONAZOLE 2 % EX CREA: TOPICAL_CREAM | CUTANEOUS | 6 refills | Status: DC

## 2020-04-21 NOTE — Progress Notes (Signed)
Follow-Up Visit   Subjective  Logan Willis is a 75 y.o. male who presents for the following: UBSE  (Hx AK's ), tinea pedis and unguium (S/P Lamisil, patient needs refills on Ketoconazole 2% cream), and Actinic Keratosis (Of the face and ears - check for new or persistent skin lesions today). The patient presents for Upper Body Skin Exam (UBSE) for skin cancer screening and mole check.  The following portions of the chart were reviewed this encounter and updated as appropriate:   Tobacco  Allergies  Meds  Problems  Med Hx  Surg Hx  Fam Hx     Review of Systems:  No other skin or systemic complaints except as noted in HPI or Assessment and Plan.  Objective  Well appearing patient in no apparent distress; mood and affect are within normal limits.  All skin waist up examined.  Objective  B/L feet: Significant scale.  Objective  Face x 8 (8): Erythematous thin papules/macules with gritty scale.   Objective  L neck x 5 (5): Erythematous keratotic or waxy stuck-on papule or plaque.   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 Continue Ketoconazole 2% cream QHS.  Restart Lamisil 250mg  po QD #30 1RF.   ketoconazole (NIZORAL) 2 % cream - B/L feet terbinafine (LAMISIL) 250 MG tablet - B/L feet Other Related Medications terbinafine (LAMISIL) 250 MG tablet  AK (actinic keratosis) (8) Face x 8  Destruction of lesion - Face x 8 Complexity: simple   Destruction method: cryotherapy   Informed consent: discussed and consent obtained   Timeout:  patient name, date of birth, surgical site, and procedure verified Lesion destroyed using liquid nitrogen: Yes   Region frozen until ice ball extended beyond lesion: Yes   Outcome: patient tolerated procedure well with no complications   Post-procedure details: wound care instructions given    Inflamed seborrheic keratosis (5) L neck x 5  Destruction of lesion - L neck x 5 Complexity:  simple   Destruction method: cryotherapy   Informed consent: discussed and consent obtained   Timeout:  patient name, date of birth, surgical site, and procedure verified Lesion destroyed using liquid nitrogen: Yes   Region frozen until ice ball extended beyond lesion: Yes   Outcome: patient tolerated procedure well with no complications   Post-procedure details: wound care instructions given    Skin cancer screening   Lentigines - Scattered tan macules - Due to sun exposure - Benign-appering, observe - Recommend daily broad spectrum sunscreen SPF 30+ to sun-exposed areas, reapply every 2 hours as needed. - Call for any changes  Seborrheic Keratoses - Stuck-on, waxy, tan-brown papules and/or plaques  - Benign-appearing - Discussed benign etiology and prognosis. - Observe - Call for any changes  Melanocytic Nevi - Tan-brown and/or pink-flesh-colored symmetric macules and papules - Benign appearing on exam today - Observation - Call clinic for new or changing moles - Recommend daily use of broad spectrum spf 30+ sunscreen to sun-exposed areas.   Hemangiomas - Red papules - Discussed benign nature - Observe - Call for any changes  Actinic Damage - Chronic condition, secondary to cumulative UV/sun exposure - diffuse scaly erythematous macules with underlying dyspigmentation - Recommend daily broad spectrum sunscreen SPF 30+ to sun-exposed areas, reapply every 2 hours as needed.  - Staying in the shade or wearing long sleeves, sun glasses (UVA+UVB protection) and wide brim hats (4-inch brim around the entire circumference of the hat) are also recommended  for sun protection.  - Call for new or changing lesions.  Skin cancer screening performed today.  Return in about 6 months (around 10/21/2020) for AK follow up .  Luther Redo, CMA, am acting as scribe for Sarina Ser, MD .  Documentation: I have reviewed the above documentation for accuracy and completeness, and I  agree with the above.  Sarina Ser, MD

## 2020-04-21 NOTE — Patient Instructions (Signed)

## 2020-04-26 ENCOUNTER — Other Ambulatory Visit: Payer: Self-pay

## 2020-04-26 DIAGNOSIS — B351 Tinea unguium: Secondary | ICD-10-CM

## 2020-04-26 MED ORDER — TERBINAFINE HCL 250 MG PO TABS: ORAL_TABLET | ORAL | 1 refills | Status: DC

## 2020-08-30 ENCOUNTER — Other Ambulatory Visit: Payer: Self-pay

## 2020-08-30 ENCOUNTER — Encounter: Payer: Self-pay | Admitting: Nurse Practitioner

## 2020-08-30 ENCOUNTER — Ambulatory Visit: Payer: No Typology Code available for payment source | Admitting: Internal Medicine

## 2020-08-30 ENCOUNTER — Ambulatory Visit (INDEPENDENT_AMBULATORY_CARE_PROVIDER_SITE_OTHER): Payer: No Typology Code available for payment source | Admitting: Nurse Practitioner

## 2020-08-30 VITALS — BP 93/59 | HR 59 | Temp 99.0°F | Ht 69.0 in | Wt 233.4 lb

## 2020-08-30 DIAGNOSIS — I10 Essential (primary) hypertension: Secondary | ICD-10-CM

## 2020-08-30 DIAGNOSIS — E782 Mixed hyperlipidemia: Secondary | ICD-10-CM | POA: Insufficient documentation

## 2020-08-30 DIAGNOSIS — E6609 Other obesity due to excess calories: Secondary | ICD-10-CM

## 2020-08-30 DIAGNOSIS — K2100 Gastro-esophageal reflux disease with esophagitis, without bleeding: Secondary | ICD-10-CM | POA: Diagnosis not present

## 2020-08-30 DIAGNOSIS — N138 Other obstructive and reflux uropathy: Secondary | ICD-10-CM

## 2020-08-30 DIAGNOSIS — N1831 Chronic kidney disease, stage 3a: Secondary | ICD-10-CM

## 2020-08-30 DIAGNOSIS — N401 Enlarged prostate with lower urinary tract symptoms: Secondary | ICD-10-CM

## 2020-08-30 DIAGNOSIS — Z6834 Body mass index (BMI) 34.0-34.9, adult: Secondary | ICD-10-CM

## 2020-08-30 MED ORDER — BENAZEPRIL HCL 10 MG PO TABS: 10.0000 mg | ORAL_TABLET | Freq: Every day | ORAL | 4 refills | Status: DC

## 2020-08-30 MED ORDER — ROSUVASTATIN CALCIUM 10 MG PO TABS: 10.0000 mg | ORAL_TABLET | Freq: Every day | ORAL | 4 refills | Status: DC

## 2020-08-30 NOTE — Progress Notes (Signed)
BP (!) 93/59   Pulse (!) 59   Temp 99 F (37.2 C) (Oral)   Ht '5\' 9"'$  (1.753 m)   Wt 233 lb 6.4 oz (105.9 kg)   SpO2 97%   BMI 34.47 kg/m    Subjective:    Patient ID: Logan Willis, male    DOB: January 09, 1946, 75 y.o.   MRN: [id number]  HPI: Logan Willis is a 75 y.o. male  Chief Complaint  Patient presents with   Hypertension   Gastroesophageal Reflux   Benign Prostatic Hypertrophy   HYPERTENSION Continues on Benazepril 20 MG daily.  Ordered statin last visit, took it one time -- got bad stomach ache. Hypertension status: stable  Satisfied with current treatment? yes Duration of hypertension: chronic BP monitoring frequency:  not checking BP range:  BP medication side effects:  no Medication compliance: good compliance Previous BP meds: Benazepril Aspirin: no Recurrent headaches: no Visual changes: no Palpitations: no Dyspnea: no Chest pain: no Lower extremity edema: no Dizzy/lightheaded: no  The 10-year ASCVD risk score Mikey Bussing DC Jr., et al., 2013) is: 14.7%   Values used to calculate the score:     Age: 36 years     Sex: Male     Is Non-Hispanic African American: No     Diabetic: No     Tobacco smoker: No     Systolic Blood Pressure: 93 mmHg     Is BP treated: Yes     HDL Cholesterol: 42 mg/dL     Total Cholesterol: 159 mg/dL  BPH Continues on Flomax daily. BPH status: stable Satisfied with current treatment?: yes Medication side effects: no Medication compliance: good compliance Duration: chronic Nocturia: 2-3 x per night Urinary frequency:no Incomplete voiding: no Urgency: no Weak urinary stream: no Straining to start stream: no Dysuria: no Onset: gradual Severity: mild  IPSS Questionnaire (AUA-7): 7 score Over the past month.   1)  How often have you had a sensation of not emptying your bladder completely after you finish urinating?  0 - Not at all  2)  How often have you had to urinate again less than two hours after you finished  urinating? 1 - Less than 1 time in 5  3)  How often have you found you stopped and started again several times when you urinated?  0 - Not at all  4) How difficult have you found it to postpone urination?  0 - Not at all  5) How often have you had a weak urinary stream?  3 - About half the time  6) How often have you had to push or strain to begin urination?  0 - Not at all  7) How many times did you most typically get up to urinate from the time you went to bed until the time you got up in the morning?  3 - 3 times  Total score:  0-7 mildly symptomatic   8-19 moderately symptomatic   20-35 severely symptomatic    GERD Only when eats spicy foods, takes Mylanta and this helps. GERD control status: stable  Satisfied with current treatment? yes Heartburn frequency: a few times a month Previous GERD medications: none Antacid use frequency:  none Nature: burning Location: epigastric Heartburn duration: 30 minutes Alleviatiating factors:  unknown Aggravating factors: different foods Dysphagia: no Odynophagia:  no Hematemesis: no Blood in stool: no EGD: no  Relevant past medical, surgical, family and social history reviewed and updated as indicated. Interim medical history since our last  visit reviewed. Allergies and medications reviewed and updated.  Review of Systems  Constitutional:  Negative for activity change, diaphoresis, fatigue and fever.  Respiratory:  Negative for cough, chest tightness, shortness of breath and wheezing.   Cardiovascular:  Negative for chest pain, palpitations and leg swelling.  Gastrointestinal: Negative.   Neurological: Negative.   Psychiatric/Behavioral: Negative.     Per HPI unless specifically indicated above     Objective:    BP (!) 93/59   Pulse (!) 59   Temp 99 F (37.2 C) (Oral)   Ht '5\' 9"'$  (1.753 m)   Wt 233 lb 6.4 oz (105.9 kg)   SpO2 97%   BMI 34.47 kg/m   Wt Readings from Last 3 Encounters:  08/30/20 233 lb 6.4 oz (105.9 kg)   03/03/20 238 lb (108 kg)  08/27/19 240 lb (108.9 kg)    Physical Exam Vitals and nursing note reviewed.  Constitutional:      General: He is awake. He is not in acute distress.    Appearance: He is well-developed and well-groomed. He is obese. He is not ill-appearing or toxic-appearing.  HENT:     Head: Normocephalic and atraumatic.     Right Ear: Hearing normal. No drainage.     Left Ear: Hearing normal. No drainage.  Eyes:     General: Lids are normal.        Right eye: No discharge.        Left eye: No discharge.     Conjunctiva/sclera: Conjunctivae normal.     Pupils: Pupils are equal, round, and reactive to light.  Neck:     Thyroid: No thyromegaly.     Vascular: No carotid bruit.  Cardiovascular:     Rate and Rhythm: Normal rate and regular rhythm.     Heart sounds: Normal heart sounds, S1 normal and S2 normal. No murmur heard.   No gallop.  Pulmonary:     Effort: Pulmonary effort is normal. No accessory muscle usage or respiratory distress.     Breath sounds: Normal breath sounds.  Abdominal:     General: Bowel sounds are normal.     Palpations: Abdomen is soft. There is no hepatomegaly or splenomegaly.  Musculoskeletal:        General: Normal range of motion.     Cervical back: Normal range of motion and neck supple.     Right lower leg: No edema.     Left lower leg: No edema.  Skin:    General: Skin is warm and dry.     Capillary Refill: Capillary refill takes less than 2 seconds.     Findings: No rash.  Neurological:     Mental Status: He is alert and oriented to person, place, and time.     Deep Tendon Reflexes: Reflexes are normal and symmetric.  Psychiatric:        Mood and Affect: Mood normal.        Speech: Speech normal.        Behavior: Behavior normal. Behavior is cooperative.        Thought Content: Thought content normal.    Results for orders placed or performed in visit on 03/03/20  CBC with Differential/Platelet  Result Value Ref Range    WBC 4.4 3.4 - 10.8 x10E3/uL   RBC 5.43 4.14 - 5.80 x10E6/uL   Hemoglobin 15.8 13.0 - 17.7 g/dL   Hematocrit 47.0 37.5 - 51.0 %   MCV 87 79 - 97 fL   MCH 29.1  26.6 - 33.0 pg   MCHC 33.6 31.5 - 35.7 g/dL   RDW 13.9 11.6 - 15.4 %   Platelets 167 150 - 450 x10E3/uL   Neutrophils 65 Not Estab. %   Lymphs 21 Not Estab. %   Monocytes 11 Not Estab. %   Eos 2 Not Estab. %   Basos 1 Not Estab. %   Neutrophils Absolute 2.9 1.4 - 7.0 x10E3/uL   Lymphocytes Absolute 0.9 0.7 - 3.1 x10E3/uL   Monocytes Absolute 0.5 0.1 - 0.9 x10E3/uL   EOS (ABSOLUTE) 0.1 0.0 - 0.4 x10E3/uL   Basophils Absolute 0.0 0.0 - 0.2 x10E3/uL   Immature Granulocytes 0 Not Estab. %   Immature Grans (Abs) 0.0 0.0 - 0.1 x10E3/uL  Comprehensive metabolic panel  Result Value Ref Range   Glucose 69 65 - 99 mg/dL   BUN 20 8 - 27 mg/dL   Creatinine, Ser 1.10 0.76 - 1.27 mg/dL   GFR calc non Af Amer 67 >59 mL/min/1.73   GFR calc Af Amer 77 >59 mL/min/1.73   BUN/Creatinine Ratio 18 10 - 24   Sodium 139 134 - 144 mmol/L   Potassium 4.4 3.5 - 5.2 mmol/L   Chloride 102 96 - 106 mmol/L   CO2 22 20 - 29 mmol/L   Calcium 9.4 8.6 - 10.2 mg/dL   Total Protein 6.5 6.0 - 8.5 g/dL   Albumin 4.4 3.7 - 4.7 g/dL   Globulin, Total 2.1 1.5 - 4.5 g/dL   Albumin/Globulin Ratio 2.1 1.2 - 2.2   Bilirubin Total 0.4 0.0 - 1.2 mg/dL   Alkaline Phosphatase 57 44 - 121 IU/L   AST 19 0 - 40 IU/L   ALT 31 0 - 44 IU/L  Lipid Panel w/o Chol/HDL Ratio  Result Value Ref Range   Cholesterol, Total 159 100 - 199 mg/dL   Triglycerides 79 0 - 149 mg/dL   HDL 42 >39 mg/dL   VLDL Cholesterol Cal 15 5 - 40 mg/dL   LDL Chol Calc (NIH) 102 (H) 0 - 99 mg/dL  TSH  Result Value Ref Range   TSH 1.810 0.450 - 4.500 uIU/mL  PSA  Result Value Ref Range   Prostate Specific Ag, Serum 2.6 0.0 - 4.0 ng/mL  Microalbumin, Urine Waived  Result Value Ref Range   Microalb, Ur Waived 10 0 - 19 mg/L   Creatinine, Urine Waived 100 10 - 300 mg/dL   Microalb/Creat  Ratio <30 <30 mg/g      Assessment & Plan:   Problem List Items Addressed This Visit       Cardiovascular and Mediastinum   Essential hypertension    Chronic, stable with BP well below goal today.  Recommend he check BP at least weekly at home and document.  Will decrease Benazepril to 10 MG a this time and monitor.  Labs obtained today and script sent with changes.  Advised him not to take Allegra D, but instead take plain Allegra due to possibility of elevation in BP with addition of D.  Return in 6 weeks for follow-up.      Relevant Medications   rosuvastatin (CRESTOR) 10 MG tablet   benazepril (LOTENSIN) 10 MG tablet   Other Relevant Orders   Basic metabolic panel     Digestive   Reflux esophagitis    Occasionally present, recommend keeping food journal and attempting to avoid foods that trigger symptoms.  If needed may trial OTC Pepcid 30 minutes prior to meals that may cause irritation.  Educated  on GERD.  If worsening symptoms return to office.        Genitourinary   Benign prostatic hyperplasia with urinary obstruction    Chronic, stable with minimal symptoms.  Check PSA at physical.  Continue current medication regimen and adjust as needed.  Return in 6 months.      CKD (chronic kidney disease) stage 3, GFR 30-59 ml/min (HCC) - Primary    Noted on February 2021 labs with GFR 56.  Recent labs showed improvement, continue Benazepril for kidney protection.  If worsening in future consider nephrology referral.      Relevant Orders   Basic metabolic panel     Other   Obesity    BMI 34.47.  Recommended eating smaller high protein, low fat meals more frequently and exercising 30 mins a day 5 times a week with a goal of 10-15lb weight loss in the next 3 months. Patient voiced their understanding and motivation to adhere to these recommendations.       Mixed hyperlipidemia    Chronic, did not tolerate Atorvastatin.  Will trial Rosuvastatin 10 MG daily and check lipid panel  today.  Script sent.  Return in 6 weeks for follow-up.      Relevant Medications   rosuvastatin (CRESTOR) 10 MG tablet   benazepril (LOTENSIN) 10 MG tablet   Other Relevant Orders   Lipid Panel w/o Chol/HDL Ratio     Follow up plan: Return in about 6 weeks (around 10/11/2020) for HTN/HLD -- Benazepril lowered to 10 MG last visit and Rosuvastatin started.

## 2020-08-30 NOTE — Patient Instructions (Signed)
https://www.nhlbi.nih.gov/files/docs/public/heart/dash_brief.pdf">  DASH Eating Plan DASH stands for Dietary Approaches to Stop Hypertension. The DASH eating plan is a healthy eating plan that has been shown to: Reduce high blood pressure (hypertension). Reduce your risk for type 2 diabetes, heart disease, and stroke. Help with weight loss. What are tips for following this plan? Reading food labels Check food labels for the amount of salt (sodium) per serving. Choose foods with less than 5 percent of the Daily Value of sodium. Generally, foods with less than 300 milligrams (mg) of sodium per serving fit into this eating plan. To find whole grains, look for the word "whole" as the first word in the ingredient list. Shopping Buy products labeled as "low-sodium" or "no salt added." Buy fresh foods. Avoid canned foods and pre-made or frozen meals. Cooking Avoid adding salt when cooking. Use salt-free seasonings or herbs instead of table salt or sea salt. Check with your health care provider or pharmacist before using salt substitutes. Do not fry foods. Cook foods using healthy methods such as baking, boiling, grilling, roasting, and broiling instead. Cook with heart-healthy oils, such as olive, canola, avocado, soybean, or sunflower oil. Meal planning  Eat a balanced diet that includes: 4 or more servings of fruits and 4 or more servings of vegetables each day. Try to fill one-half of your plate with fruits and vegetables. 6-8 servings of whole grains each day. Less than 6 oz (170 g) of lean meat, poultry, or fish each day. A 3-oz (85-g) serving of meat is about the same size as a deck of cards. One egg equals 1 oz (28 g). 2-3 servings of low-fat dairy each day. One serving is 1 cup (237 mL). 1 serving of nuts, seeds, or beans 5 times each week. 2-3 servings of heart-healthy fats. Healthy fats called omega-3 fatty acids are found in foods such as walnuts, flaxseeds, fortified milks, and eggs.  These fats are also found in cold-water fish, such as sardines, salmon, and mackerel. Limit how much you eat of: Canned or prepackaged foods. Food that is high in trans fat, such as some fried foods. Food that is high in saturated fat, such as fatty meat. Desserts and other sweets, sugary drinks, and other foods with added sugar. Full-fat dairy products. Do not salt foods before eating. Do not eat more than 4 egg yolks a week. Try to eat at least 2 vegetarian meals a week. Eat more home-cooked food and less restaurant, buffet, and fast food.  Lifestyle When eating at a restaurant, ask that your food be prepared with less salt or no salt, if possible. If you drink alcohol: Limit how much you use to: 0-1 drink a day for women who are not pregnant. 0-2 drinks a day for men. Be aware of how much alcohol is in your drink. In the [place] one drink equals one 12 oz bottle of beer (355 mL), one 5 oz glass of wine (148 mL), or one 1 oz glass of hard liquor (44 mL). General information Avoid eating more than 2,300 mg of salt a day. If you have hypertension, you may need to reduce your sodium intake to 1,500 mg a day. Work with your health care provider to maintain a healthy body weight or to lose weight. Ask what an ideal weight is for you. Get at least 30 minutes of exercise that causes your heart to beat faster (aerobic exercise) most days of the week. Activities may include walking, swimming, or biking. Work with your health care provider   or dietitian to adjust your eating plan to your individual calorie needs. What foods should I eat? Fruits All fresh, dried, or frozen fruit. Canned fruit in natural juice (without addedsugar). Vegetables Fresh or frozen vegetables (raw, steamed, roasted, or grilled). Low-sodium or reduced-sodium tomato and vegetable juice. Low-sodium or reduced-sodium tomatosauce and tomato paste. Low-sodium or reduced-sodium canned vegetables. Grains Whole-grain or  whole-wheat bread. Whole-grain or whole-wheat pasta. Brown rice. Oatmeal. Quinoa. Bulgur. Whole-grain and low-sodium cereals. Pita bread.Low-fat, low-sodium crackers. Whole-wheat flour tortillas. Meats and other proteins Skinless chicken or turkey. Ground chicken or turkey. Pork with fat trimmed off. Fish and seafood. Egg whites. Dried beans, peas, or lentils. Unsalted nuts, nut butters, and seeds. Unsalted canned beans. Lean cuts of beef with fat trimmed off. Low-sodium, lean precooked or cured meat, such as sausages or meatloaves. Dairy Low-fat (1%) or fat-free (skim) milk. Reduced-fat, low-fat, or fat-free cheeses. Nonfat, low-sodium ricotta or cottage cheese. Low-fat or nonfatyogurt. Low-fat, low-sodium cheese. Fats and oils Soft margarine without trans fats. Vegetable oil. Reduced-fat, low-fat, or light mayonnaise and salad dressings (reduced-sodium). Canola, safflower, olive, avocado, soybean, andsunflower oils. Avocado. Seasonings and condiments Herbs. Spices. Seasoning mixes without salt. Other foods Unsalted popcorn and pretzels. Fat-free sweets. The items listed above may not be a complete list of foods and beverages you can eat. Contact a dietitian for more information. What foods should I avoid? Fruits Canned fruit in a light or heavy syrup. Fried fruit. Fruit in cream or buttersauce. Vegetables Creamed or fried vegetables. Vegetables in a cheese sauce. Regular canned vegetables (not low-sodium or reduced-sodium). Regular canned tomato sauce and paste (not low-sodium or reduced-sodium). Regular tomato and vegetable juice(not low-sodium or reduced-sodium). Pickles. Olives. Grains Baked goods made with fat, such as croissants, muffins, or some breads. Drypasta or rice meal packs. Meats and other proteins Fatty cuts of meat. Ribs. Fried meat. Bacon. Bologna, salami, and other precooked or cured meats, such as sausages or meat loaves. Fat from the back of a pig (fatback). Bratwurst.  Salted nuts and seeds. Canned beans with added salt. Canned orsmoked fish. Whole eggs or egg yolks. Chicken or turkey with skin. Dairy Whole or 2% milk, cream, and half-and-half. Whole or full-fat cream cheese. Whole-fat or sweetened yogurt. Full-fat cheese. Nondairy creamers. Whippedtoppings. Processed cheese and cheese spreads. Fats and oils Butter. Stick margarine. Lard. Shortening. Ghee. Bacon fat. Tropical oils, suchas coconut, palm kernel, or palm oil. Seasonings and condiments Onion salt, garlic salt, seasoned salt, table salt, and sea salt. Worcestershire sauce. Tartar sauce. Barbecue sauce. Teriyaki sauce. Soy sauce, including reduced-sodium. Steak sauce. Canned and packaged gravies. Fish sauce. Oyster sauce. Cocktail sauce. Store-bought horseradish. Ketchup. Mustard. Meat flavorings and tenderizers. Bouillon cubes. Hot sauces. Pre-made or packaged marinades. Pre-made or packaged taco seasonings. Relishes. Regular saladdressings. Other foods Salted popcorn and pretzels. The items listed above may not be a complete list of foods and beverages you should avoid. Contact a dietitian for more information. Where to find more information National Heart, Lung, and Blood Institute: www.nhlbi.nih.gov American Heart Association: www.heart.org Academy of Nutrition and Dietetics: www.eatright.org National Kidney Foundation: www.kidney.org Summary The DASH eating plan is a healthy eating plan that has been shown to reduce high blood pressure (hypertension). It may also reduce your risk for type 2 diabetes, heart disease, and stroke. When on the DASH eating plan, aim to eat more fresh fruits and vegetables, whole grains, lean proteins, low-fat dairy, and heart-healthy fats. With the DASH eating plan, you should limit salt (sodium) intake to 2,300   mg a day. If you have hypertension, you may need to reduce your sodium intake to 1,500 mg a day. Work with your health care provider or dietitian to adjust  your eating plan to your individual calorie needs. This information is not intended to replace advice given to you by your health care provider. Make sure you discuss any questions you have with your healthcare provider. Document Revised: 11/29/2018 Document Reviewed: 11/29/2018 Elsevier Patient Education  2022 Elsevier Inc.  

## 2020-08-30 NOTE — Assessment & Plan Note (Signed)
Chronic, stable with BP well below goal today.  Recommend he check BP at least weekly at home and document.  Will decrease Benazepril to 10 MG a this time and monitor.  Labs obtained today and script sent with changes.  Advised him not to take Allegra D, but instead take plain Allegra due to possibility of elevation in BP with addition of D.  Return in 6 weeks for follow-up.

## 2020-08-30 NOTE — Assessment & Plan Note (Signed)
Chronic, did not tolerate Atorvastatin.  Will trial Rosuvastatin 10 MG daily and check lipid panel today.  Script sent.  Return in 6 weeks for follow-up.

## 2020-08-30 NOTE — Assessment & Plan Note (Signed)
Occasionally present, recommend keeping food journal and attempting to avoid foods that trigger symptoms.  If needed may trial OTC Pepcid 30 minutes prior to meals that may cause irritation.  Educated on GERD.  If worsening symptoms return to office.

## 2020-08-30 NOTE — Assessment & Plan Note (Signed)
BMI 34.47.  Recommended eating smaller high protein, low fat meals more frequently and exercising 30 mins a day 5 times a week with a goal of 10-15lb weight loss in the next 3 months. Patient voiced their understanding and motivation to adhere to these recommendations.

## 2020-08-30 NOTE — Assessment & Plan Note (Signed)
Chronic, stable with minimal symptoms.  Check PSA at physical.  Continue current medication regimen and adjust as needed.  Return in 6 months.

## 2020-08-30 NOTE — Assessment & Plan Note (Signed)
Noted on February 2021 labs with GFR 56.  Recent labs showed improvement, continue Benazepril for kidney protection.  If worsening in future consider nephrology referral.

## 2020-08-31 LAB — LIPID PANEL W/O CHOL/HDL RATIO
Cholesterol, Total: 162 mg/dL (ref 100–199)
HDL: 40 mg/dL (ref >39)
LDL Chol Calc (NIH): 101 mg/dL — ABNORMAL HIGH (ref 0–99)
Triglycerides: 114 mg/dL (ref 0–149)
VLDL Cholesterol Cal: 21 mg/dL (ref 5–40)

## 2020-08-31 LAB — BASIC METABOLIC PANEL
BUN/Creatinine Ratio: 12 (ref 10–24)
BUN: 16 mg/dL (ref 8–27)
CO2: 22 mmol/L (ref 20–29)
Calcium: 9.3 mg/dL (ref 8.6–10.2)
Chloride: 102 mmol/L (ref 96–106)
Creatinine, Ser: 1.36 mg/dL — ABNORMAL HIGH (ref 0.76–1.27)
Glucose: 69 mg/dL (ref 65–99)
Potassium: 4.2 mmol/L (ref 3.5–5.2)
Sodium: 142 mmol/L (ref 134–144)
eGFR: 55 mL/min/{1.73_m2} — ABNORMAL LOW (ref >59)

## 2020-08-31 MED ORDER — ROSUVASTATIN CALCIUM 20 MG PO TABS
20.0000 mg | ORAL_TABLET | Freq: Every day | ORAL | 3 refills | Status: DC
Start: 2020-08-31 — End: 2021-05-25

## 2020-08-31 NOTE — Progress Notes (Signed)
Contacted via MyChart   Good afternoon Logan Willis, your labs have returned and kidney function continues to show some kidney disease, but no worsening.  We will continue to monitor creatinine and eGFR regularly.  Cholesterol levels continue to show LDL above goal, would like to see this less then 70.  Would recommend we increase Rosuvastatin to 20 MG, I will send this in and you can start to take 2 of your 10 MG pills to finish them out.  Any questions? Keep being amazing!!  Thank you for allowing me to participate in your care.  I appreciate you. Kindest regards, Emoni Yang

## 2020-08-31 NOTE — Addendum Note (Signed)
Addended by: Marnee Guarneri T on: 08/31/2020 12:56 PM   Modules accepted: Orders

## 2020-10-14 ENCOUNTER — Encounter: Payer: Self-pay | Admitting: Internal Medicine

## 2020-10-14 ENCOUNTER — Ambulatory Visit (INDEPENDENT_AMBULATORY_CARE_PROVIDER_SITE_OTHER): Payer: No Typology Code available for payment source | Admitting: Internal Medicine

## 2020-10-14 ENCOUNTER — Other Ambulatory Visit: Payer: Self-pay

## 2020-10-14 VITALS — BP 110/70 | HR 60 | Temp 98.3°F | Ht 69.02 in | Wt 236.4 lb

## 2020-10-14 DIAGNOSIS — I10 Essential (primary) hypertension: Secondary | ICD-10-CM

## 2020-10-14 DIAGNOSIS — E6609 Other obesity due to excess calories: Secondary | ICD-10-CM | POA: Diagnosis not present

## 2020-10-14 DIAGNOSIS — Z6834 Body mass index (BMI) 34.0-34.9, adult: Secondary | ICD-10-CM

## 2020-10-14 DIAGNOSIS — R350 Frequency of micturition: Secondary | ICD-10-CM | POA: Insufficient documentation

## 2020-10-14 DIAGNOSIS — R351 Nocturia: Secondary | ICD-10-CM | POA: Insufficient documentation

## 2020-10-14 DIAGNOSIS — Z87442 Personal history of urinary calculi: Secondary | ICD-10-CM

## 2020-10-14 DIAGNOSIS — Z23 Encounter for immunization: Secondary | ICD-10-CM | POA: Diagnosis not present

## 2020-10-14 DIAGNOSIS — R7989 Other specified abnormal findings of blood chemistry: Secondary | ICD-10-CM

## 2020-10-14 NOTE — Progress Notes (Signed)
BP 110/70   Pulse 60   Temp 98.3 F (36.8 C) (Oral)   Ht 5' 9.02" (1.753 m)   Wt 236 lb 6.4 oz (107.2 kg)   SpO2 97%   BMI 34.89 kg/m    Subjective:    Patient ID: Logan Willis, male    DOB: 1945-09-28, 75 y.o.   MRN: [id number]  Chief Complaint  Patient presents with   Hypertension    Benazepril lowered to 10 mg at last appt.   Hyperlipidemia    Started on Rosuvastatin at last appt. Patient states that he does not have this medication.   Chronic Kidney Disease    HPI: Logan Willis is a 75 y.o. 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 The current episode started more than 1 year ago. The problem has been gradually improving since onset. Pertinent negatives include no chest pain, headaches, palpitations, shortness of breath or sweats.  Hyperlipidemia Pertinent negatives include no chest pain, myalgias or shortness of breath.  Urinary Frequency  This is a chronic (nocturia x 3 at night x 2-3 yrs is on flomax) problem. Associated symptoms include frequency. Pertinent negatives include no chills, discharge, flank pain, hematuria, hesitancy, nausea, possible pregnancy, sweats, urgency or vomiting.   Chief Complaint  Patient presents with   Hypertension    Benazepril lowered to 10 mg at last appt.   Hyperlipidemia    Started on Rosuvastatin at last appt. Patient states that he does not have this medication.   Chronic Kidney Disease    Relevant past medical, surgical, family and social history reviewed and updated as indicated. Interim medical history since our last visit reviewed. Allergies and medications reviewed and updated.  Review of Systems  Constitutional:  Negative for activity change, appetite change, chills, fatigue and fever.  HENT:  Negative for congestion, ear discharge, ear pain and facial swelling.   Eyes:  Negative for pain, discharge and itching.  Respiratory:  Negative for cough, chest  tightness, shortness of breath and wheezing.   Cardiovascular:  Negative for chest pain, palpitations and leg swelling.  Gastrointestinal:  Negative for abdominal distention, abdominal pain, blood in stool, constipation, diarrhea, nausea and vomiting.  Endocrine: Negative for cold intolerance, heat intolerance, polydipsia, polyphagia and polyuria.  Genitourinary:  Positive for frequency. Negative for difficulty urinating, dysuria, flank pain, hematuria, hesitancy and urgency.  Musculoskeletal:  Negative for arthralgias, gait problem, joint swelling and myalgias.  Skin:  Negative for color change, rash and wound.  Neurological:  Negative for dizziness, tremors, speech difficulty, weakness, light-headedness, numbness and headaches.  Hematological:  Does not bruise/bleed easily.  Psychiatric/Behavioral:  Negative for agitation, confusion, decreased concentration, sleep disturbance and suicidal ideas.    Per HPI unless specifically indicated above     Objective:    BP 110/70   Pulse 60   Temp 98.3 F (36.8 C) (Oral)   Ht 5' 9.02" (1.753 m)   Wt 236 lb 6.4 oz (107.2 kg)   SpO2 97%   BMI 34.89 kg/m   Wt Readings from Last 3 Encounters:  10/14/20 236 lb 6.4 oz (107.2 kg)  08/30/20 233 lb 6.4 oz (105.9 kg)  03/03/20 238 lb (108 kg)    Physical Exam Vitals and nursing note reviewed.  Constitutional:      General: He is not in acute distress.    Appearance: Normal appearance. He is not ill-appearing or diaphoretic.  HENT:     Head:  Normocephalic and atraumatic.     Right Ear: Tympanic membrane and external ear normal. There is no impacted cerumen.     Left Ear: External ear normal.     Nose: No congestion or rhinorrhea.     Mouth/Throat:     Pharynx: No oropharyngeal exudate or posterior oropharyngeal erythema.  Eyes:     Conjunctiva/sclera: Conjunctivae normal.     Pupils: Pupils are equal, round, and reactive to light.  Cardiovascular:     Rate and Rhythm: Normal rate and  regular rhythm.     Heart sounds: No murmur heard.   No friction rub. No gallop.  Pulmonary:     Effort: No respiratory distress.     Breath sounds: No stridor. No wheezing or rhonchi.  Chest:     Chest wall: No tenderness.  Abdominal:     General: Abdomen is flat. Bowel sounds are normal.     Palpations: Abdomen is soft. There is no mass.     Tenderness: There is no abdominal tenderness.  Musculoskeletal:     Cervical back: Normal range of motion and neck supple. No rigidity or tenderness.     Left lower leg: No edema.  Skin:    General: Skin is warm and dry.  Neurological:     Mental Status: He is alert.  Psychiatric:        Mood and Affect: Mood normal.        Behavior: Behavior normal.    Results for orders placed or performed in visit on 44/31/54  Basic metabolic panel  Result Value Ref Range   Glucose 69 65 - 99 mg/dL   BUN 16 8 - 27 mg/dL   Creatinine, Ser 1.36 (H) 0.76 - 1.27 mg/dL   eGFR 55 (L) >59 mL/min/1.73   BUN/Creatinine Ratio 12 10 - 24   Sodium 142 134 - 144 mmol/L   Potassium 4.2 3.5 - 5.2 mmol/L   Chloride 102 96 - 106 mmol/L   CO2 22 20 - 29 mmol/L   Calcium 9.3 8.6 - 10.2 mg/dL  Lipid Panel w/o Chol/HDL Ratio  Result Value Ref Range   Cholesterol, Total 162 100 - 199 mg/dL   Triglycerides 114 0 - 149 mg/dL   HDL 40 >39 mg/dL   VLDL Cholesterol Cal 21 5 - 40 mg/dL   LDL Chol Calc (NIH) 101 (H) 0 - 99 mg/dL        Current Outpatient Medications:    benazepril (LOTENSIN) 10 MG tablet, Take 1 tablet (10 mg total) by mouth daily., Disp: 90 tablet, Rfl: 4   fexofenadine-pseudoephedrine (ALLEGRA-D 24) 180-240 MG 24 hr tablet, Take 1 tablet by mouth daily as needed., Disp: , Rfl:    ketoconazole (NIZORAL) 2 % cream, , Disp: , Rfl:    ketoconazole (NIZORAL) 2 % cream, Apply to feet QHS, Disp: 60 g, Rfl: 6   rosuvastatin (CRESTOR) 20 MG tablet, Take 1 tablet (20 mg total) by mouth daily., Disp: 90 tablet, Rfl: 3   tamsulosin (FLOMAX) 0.4 MG CAPS  capsule, Take 1 capsule (0.4 mg total) by mouth daily., Disp: 90 capsule, Rfl: 4   triamcinolone cream (KENALOG) 0.1 %, Apply 1 application topically 2 (two) times daily. , Disp: , Rfl:    valACYclovir (VALTREX) 500 MG tablet, Take 2 tablets (1,000 mg total) by mouth 2 (two) times daily., Disp: 28 tablet, Rfl: 12    Assessment & Plan:  HTN is on benzapril 10 mg.  Continue current meds.  Medication compliance emphasised.  pt advised to keep Bp logs. Pt verbalised understanding of the same. Pt to have a low salt diet . Exercise to reach a goal of at least 150 mins a week.  lifestyle modifications explained and pt understands importance of the above.  2. Onychomycosis  Stable on ketconazole cream on nails   3. HLD  Is on crestor 20 mg LDL stable, LFT's work on diet, SE of meds explained to pt. low fat and high fiber diet explained to pt.  4. Nocturia x 3 @ night. 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   6 Problem List Items Addressed This Visit   None Visit Diagnoses     Need for influenza vaccination    -  Primary   Relevant Orders   Flu Vaccine QUAD High Dose(Fluad) (Completed)        Orders Placed This Encounter  Procedures   Flu Vaccine QUAD High Dose(Fluad)     No orders of the defined types were placed in this encounter.    Follow up plan: No follow-ups on file.

## 2020-10-25 ENCOUNTER — Ambulatory Visit (INDEPENDENT_AMBULATORY_CARE_PROVIDER_SITE_OTHER): Payer: No Typology Code available for payment source | Admitting: Dermatology

## 2020-10-25 ENCOUNTER — Other Ambulatory Visit: Payer: Self-pay

## 2020-10-25 DIAGNOSIS — L820 Inflamed seborrheic keratosis: Secondary | ICD-10-CM

## 2020-10-25 DIAGNOSIS — Z1283 Encounter for screening for malignant neoplasm of skin: Secondary | ICD-10-CM

## 2020-10-25 DIAGNOSIS — B353 Tinea pedis: Secondary | ICD-10-CM

## 2020-10-25 DIAGNOSIS — L570 Actinic keratosis: Secondary | ICD-10-CM

## 2020-10-25 DIAGNOSIS — D229 Melanocytic nevi, unspecified: Secondary | ICD-10-CM

## 2020-10-25 DIAGNOSIS — L578 Other skin changes due to chronic exposure to nonionizing radiation: Secondary | ICD-10-CM

## 2020-10-25 DIAGNOSIS — D1800 Hemangioma unspecified site: Secondary | ICD-10-CM

## 2020-10-25 DIAGNOSIS — L821 Other seborrheic keratosis: Secondary | ICD-10-CM

## 2020-10-25 DIAGNOSIS — L814 Other melanin hyperpigmentation: Secondary | ICD-10-CM | POA: Diagnosis not present

## 2020-10-25 DIAGNOSIS — B351 Tinea unguium: Secondary | ICD-10-CM | POA: Diagnosis not present

## 2020-10-25 MED ORDER — TERBINAFINE HCL 250 MG PO TABS: ORAL_TABLET | ORAL | 1 refills | Status: DC

## 2020-10-25 NOTE — Progress Notes (Signed)
Follow-Up Visit   Subjective  Logan Willis is a 75 y.o. male who presents for the following: Follow-up (Patient here today for 6 month follow up on ak at face and tinea unguium at both feet. Patient reports he has not noticed any new spots and feels his feet have improved some. ). Patient here for upper body skin exam and skin cancer screening.  The following portions of the chart were reviewed this encounter and updated as appropriate:  Tobacco  Allergies  Meds  Problems  Med Hx  Surg Hx  Fam Hx     Review of Systems: No other skin or systemic complaints except as noted in HPI or Assessment and Plan.  Objective  Well appearing patient in no apparent distress; mood and affect are within normal limits.  A focused examination was performed including upper extremities, including the arms, hands, fingers, and fingernails and and bilateral feet . Relevant physical exam findings are noted in the Assessment and Plan.  scalp and face x 9 (9) Erythematous thin papules/macules with gritty scale.   bilateral feet some scale with improvement   right temple, scalp, neck x 8 (8) Erythematous keratotic or waxy stuck-on papule or plaque.   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  Restart Lamisil 250mg  po QD #30 1RF.  related Medications ketoconazole (NIZORAL) 2 % cream Apply to  feet QHS  terbinafine (LAMISIL) 250 MG tablet Take one tab po QD. Do not take cholesterol medication while on treatment.  Inflamed seborrheic keratosis right temple, scalp, neck x 8 Destruction of lesion - right temple, scalp, neck x 8 Complexity: simple   Destruction method: cryotherapy   Informed consent: discussed and consent obtained   Timeout:  patient name, date of birth, surgical site, and procedure verified Lesion destroyed using liquid nitrogen: Yes   Region frozen until ice ball extended beyond lesion: Yes   Outcome: patient tolerated procedure well with no complications   Post-procedure details: wound care instructions given    Skin cancer screening  Lentigines - Scattered tan macules - Due to sun exposure - Benign-appearing, observe - Recommend daily broad spectrum sunscreen SPF 30+ to sun-exposed areas, reapply every 2 hours as needed. - Call for any changes  Seborrheic Keratoses - Stuck-on, waxy, tan-brown papules and/or plaques  - Benign-appearing - Discussed benign etiology and prognosis. - Observe - Call for any changes  Melanocytic Nevi - Tan-brown and/or pink-flesh-colored symmetric macules and papules - Benign appearing on exam today - Observation - Call clinic for new or changing moles - Recommend daily use of broad spectrum spf 30+ sunscreen to sun-exposed areas.   Hemangiomas - Red papules - Discussed benign nature - Observe - Call for any changes  Actinic Damage - Chronic condition, secondary to cumulative UV/sun exposure - diffuse scaly erythematous macules with underlying dyspigmentation - Recommend daily broad spectrum sunscreen SPF 30+ to sun-exposed areas, reapply every  2 hours as needed.  - Staying in the shade or wearing long sleeves, sun glasses (UVA+UVB protection) and wide brim hats (4-inch brim around the entire circumference of the hat) are also recommended for sun protection.  - Call for new or changing lesions.  Skin cancer  screening performed today.  Return for 6 - 12 month ak follow up . IRuthell Rummage, CMA, am acting as scribe for Sarina Ser, MD. Documentation: I have reviewed the above documentation for accuracy and completeness, and I agree with the above.  Sarina Ser, MD

## 2020-10-25 NOTE — Patient Instructions (Signed)
Actinic keratoses are precancerous spots that appear secondary to cumulative UV radiation exposure/sun exposure over time. They are chronic with expected duration over 1 year. A portion of actinic keratoses will progress to squamous cell carcinoma of the skin. It is not possible to reliably predict which spots will progress to skin cancer and so treatment is recommended to prevent development of skin cancer.  Recommend daily broad spectrum sunscreen SPF 30+ to sun-exposed areas, reapply every 2 hours as needed.  Recommend staying in the shade or wearing long sleeves, sun glasses (UVA+UVB protection) and wide brim hats (4-inch brim around the entire circumference of the hat). Call for new or changing lesions.   Cryotherapy Aftercare  Wash gently with soap and water everyday.   Apply Vaseline and Band-Aid daily until healed.    Melanoma ABCDEs  Melanoma is the most dangerous type of skin cancer, and is the leading cause of death from skin disease.  You are more likely to develop melanoma if you: Have light-colored skin, light-colored eyes, or red or blond hair Spend a lot of time in the sun Tan regularly, either outdoors or in a tanning bed Have had blistering sunburns, especially during childhood Have a close family member who has had a melanoma Have atypical moles or large birthmarks  Early detection of melanoma is key since treatment is typically straightforward and cure rates are extremely high if we catch it early.   The first sign of melanoma is often a change in a mole or a new dark spot.  The ABCDE system is a way of remembering the signs of melanoma.  A for asymmetry:  The two halves do not match. B for border:  The edges of the growth are irregular. C for color:  A mixture of colors are present instead of an even brown color. D for diameter:  Melanomas are usually (but not always) greater than 74mm - the size of a pencil eraser. E for evolution:  The spot keeps changing in size,  shape, and color.  Please check your skin once per month between visits. You can use a small mirror in front and a large mirror behind you to keep an eye on the back side or your body.   If you see any new or changing lesions before your next follow-up, please call to schedule a visit.  Please continue daily skin protection including broad spectrum sunscreen SPF 30+ to sun-exposed areas, reapplying every 2 hours as needed when you're outdoors.   Staying in the shade or wearing long sleeves, sun glasses (UVA+UVB protection) and wide brim hats (4-inch brim around the entire circumference of the hat) are also recommended for sun protection.    If you have any questions or concerns for your doctor, please call our main line at [phone] and press option 4 to reach your doctor's medical assistant. If no one answers, please leave a voicemail as directed and we will return your call as soon as possible. Messages left after 4 pm will be answered the following business day.   You may also send [place] a message via [place]. We typically respond to MyChart messages within 1-2 business days.  For prescription refills, please ask your pharmacy to contact our office. Our fax number is [phone].  If you have an urgent issue when the clinic is closed that cannot wait until the next business day, you can page your doctor at the number below.    Please note that while we do our best to  be available for urgent issues outside of office hours, we are not available 24/7.   If you have an urgent issue and are unable to reach [place], you may choose to seek medical care at your doctor's office, retail clinic, urgent care center, or emergency room.  If you have a medical emergency, please immediately call 911 or go to the emergency department.  Pager Numbers  - Dr. Nehemiah Massed: [phone]  - Dr. Laurence Ferrari: [phone]  - Dr. Nicole Kindred: [phone]  In the event of inclement weather, please call our main line at  [phone] for an update on the status of any delays or closures.  Dermatology Medication Tips: Please keep the boxes that topical medications come in in order to help keep track of the instructions about where and how to use these. Pharmacies typically print the medication instructions only on the boxes and not directly on the medication tubes.   If your medication is too expensive, please contact our office at [phone] option 4 or send [place] a message through [place].   We are unable to tell what your co-pay for medications will be in advance as this is different depending on your insurance coverage. However, we may be able to find a substitute medication at lower cost or fill out paperwork to get insurance to cover a needed medication.   If a prior authorization is required to get your medication covered by your insurance company, please allow [place] 1-2 business days to complete this process.  Drug prices often vary depending on where the prescription is filled and some pharmacies may offer cheaper prices.  The website www.goodrx.com contains coupons for medications through different pharmacies. The prices here do not account for what the cost may be with help from insurance (it may be cheaper with your insurance), but the website can give you the price if you did not use any insurance.  - You can print the associated coupon and take it with your prescription to the pharmacy.  - You may also stop by our office during regular business hours and pick up a GoodRx coupon card.  - If you need your prescription sent electronically to a different pharmacy, notify our office through [facility] or by phone at [phone] option 4.

## 2020-10-26 ENCOUNTER — Encounter: Payer: Self-pay | Admitting: Dermatology

## 2020-11-03 ENCOUNTER — Encounter: Payer: Self-pay | Admitting: Urology

## 2020-11-03 ENCOUNTER — Other Ambulatory Visit: Payer: Self-pay

## 2020-11-03 ENCOUNTER — Ambulatory Visit (INDEPENDENT_AMBULATORY_CARE_PROVIDER_SITE_OTHER): Payer: No Typology Code available for payment source | Admitting: Urology

## 2020-11-03 VITALS — BP 118/73 | HR 72 | Ht 69.0 in | Wt 230.0 lb

## 2020-11-03 DIAGNOSIS — R399 Unspecified symptoms and signs involving the genitourinary system: Secondary | ICD-10-CM

## 2020-11-03 DIAGNOSIS — R351 Nocturia: Secondary | ICD-10-CM | POA: Diagnosis not present

## 2020-11-03 DIAGNOSIS — N200 Calculus of kidney: Secondary | ICD-10-CM | POA: Diagnosis not present

## 2020-11-03 DIAGNOSIS — R350 Frequency of micturition: Secondary | ICD-10-CM | POA: Diagnosis not present

## 2020-11-03 LAB — URINALYSIS, COMPLETE
Bilirubin, UA: NEGATIVE
Glucose, UA: NEGATIVE
Ketones, UA: NEGATIVE
Leukocytes,UA: NEGATIVE
Nitrite, UA: NEGATIVE
Protein,UA: NEGATIVE
RBC, UA: NEGATIVE
Specific Gravity, UA: 1.02 (ref 1.005–1.030)
Urobilinogen, Ur: 0.2 mg/dL (ref 0.2–1.0)
pH, UA: 6 (ref 5.0–7.5)

## 2020-11-03 LAB — BLADDER SCAN AMB NON-IMAGING

## 2020-11-03 NOTE — Progress Notes (Signed)
   11/03/2020 1:07 PM   Logan Willis 01-22-1945 [id number]  Reason for visit: Lower urinary tract symptoms, history of nephrolithiasis  HPI: 75 year old male who underwent a uncomplicated left shockwave lithotripsy with me in 2020 for an 8 mm left proximal ureteral stone.  He denies any stone events since that procedure.  He presents today with a few years of worsening urinary symptoms, primarily nocturia 2-3 times overnight.  He has been on Flomax long-term.  He has never been evaluated for sleep apnea.  He reports he snores heavily overnight.  He denies any lower extremity edema in the evenings.  He drinks primarily water during the day and some tea and occasional soda.  Denies any dysuria or gross hematuria.  Denies significant urinary symptoms during the day.  PVR normal today at 45 mL, urinalysis completely benign.  I personally viewed and interpreted CT from 2020 that shows a prostate measuring 57 g with nondistended bladder.  Recent PSA in February 2022 was normal at 2.6 and stable from prior values over the last 3 years.  We discussed possible causes of nocturia extensively.  Behavioral strategies reviewed including minimizing fluids before bedt ime, and double voiding prior to bed.  I also strongly recommended evaluation for sleep apnea prior to additional medications, as this could potentially be the cause of his nocturia.  Recommend evaluation for sleep apnea Continue Flomax RTC 6 months symptom check   Billey Co, MD  [facility] [address], [place], [place] [address] [phone]

## 2020-11-03 NOTE — Patient Instructions (Signed)
Minimize fluids 3 to 4 hours before bedtime, urinate twice before going to bed.  Avoid fluids that irritate your bladder like tea, soda, and diet drinks.  Strongly recommend evaluation for sleep apnea, as this can frequently cause excess urination overnight  Sleep Apnea Sleep apnea is a condition in which breathing pauses or becomes shallow during sleep. People with sleep apnea usually snore loudly. They may have times when they gasp and stop breathing for 10 seconds or more during sleep. This may happen many times during the night. Sleep apnea disrupts your sleep and keeps your body from getting the rest that it needs. This condition can increase your risk of certain health problems, including: Heart attack. Stroke. Obesity. Type 2 diabetes. Heart failure. Irregular heartbeat. High blood pressure. The goal of treatment is to help you breathe normally again. What are the causes? The most common cause of sleep apnea is a collapsed or blocked airway. There are three kinds of sleep apnea: Obstructive sleep apnea. This kind is caused by a blocked or collapsed airway. Central sleep apnea. This kind happens when the part of the brain that controls breathing does not send the correct signals to the muscles that control breathing. Mixed sleep apnea. This is a combination of obstructive and central sleep apnea. What increases the risk? You are more likely to develop this condition if you: Are overweight. Smoke. Have a smaller than normal airway. Are older. Are male. Drink alcohol. Take sedatives or tranquilizers. Have a family history of sleep apnea. Have a tongue or tonsils that are larger than normal. What are the signs or symptoms? Symptoms of this condition include: Trouble staying asleep. Loud snoring. Morning headaches. Waking up gasping. Dry mouth or sore throat in the morning. Daytime sleepiness and tiredness. If you have daytime fatigue because of sleep apnea, you may be more  likely to have: Trouble concentrating. Forgetfulness. Irritability or mood swings. Personality changes. Feelings of depression. Sexual dysfunction. This may include loss of interest if you are male, or erectile dysfunction if you are male. How is this diagnosed? This condition may be diagnosed with: A medical history. A physical exam. A series of tests that are done while you are sleeping (sleep study). These tests are usually done in a sleep lab, but they may also be done at home. How is this treated? Treatment for this condition aims to restore normal breathing and to ease symptoms during sleep. It may involve managing health issues that can affect breathing, such as high blood pressure or obesity. Treatment may include: Sleeping on your side. Using a decongestant if you have nasal congestion. Avoiding the use of depressants, including alcohol, sedatives, and narcotics. Losing weight if you are overweight. Making changes to your diet. Quitting smoking. Using a device to open your airway while you sleep, such as: An oral appliance. This is a custom-made mouthpiece that shifts your lower jaw forward. A continuous positive airway pressure (CPAP) device. This device blows air through a mask when you breathe out (exhale). A nasal expiratory positive airway pressure (EPAP) device. This device has valves that you put into each nostril. A bi-level positive airway pressure (BPAP) device. This device blows air through a mask when you breathe in (inhale) and breathe out (exhale). Having surgery if other treatments do not work. During surgery, excess tissue is removed to create a wider airway. Follow these instructions at home: Lifestyle Make any lifestyle changes that your health care provider recommends. Eat a healthy, well-balanced diet. Take steps to  lose weight if you are overweight. Avoid using depressants, including alcohol, sedatives, and narcotics. Do not use any products that  contain nicotine or tobacco. These products include cigarettes, chewing tobacco, and vaping devices, such as e-cigarettes. If you need help quitting, ask your health care provider. General instructions Take over-the-counter and prescription medicines only as told by your health care provider. If you were given a device to open your airway while you sleep, use it only as told by your health care provider. If you are having surgery, make sure to tell your health care provider you have sleep apnea. You may need to bring your device with you. Keep all follow-up visits. This is important. Contact a health care provider if: The device that you received to open your airway during sleep is uncomfortable or does not seem to be working. Your symptoms do not improve. Your symptoms get worse. Get help right away if: You develop: Chest pain. Shortness of breath. Discomfort in your back, arms, or stomach. You have: Trouble speaking. Weakness on one side of your body. Drooping in your face. These symptoms may represent a serious problem that is an emergency. Do not wait to see if the symptoms will go away. Get medical help right away. Call your local emergency services (911 in the [place] Do not drive yourself to the hospital. Summary Sleep apnea is a condition in which breathing pauses or becomes shallow during sleep. The most common cause is a collapsed or blocked airway. The goal of treatment is to restore normal breathing and to ease symptoms during sleep. This information is not intended to replace advice given to you by your health care provider. Make sure you discuss any questions you have with your health care provider. Document Revised: 12/05/2019 Document Reviewed: 12/05/2019 Elsevier Patient Education  2022 [employer].

## 2020-11-15 ENCOUNTER — Other Ambulatory Visit: Payer: No Typology Code available for payment source

## 2020-11-15 ENCOUNTER — Other Ambulatory Visit: Payer: Self-pay

## 2020-11-15 DIAGNOSIS — I10 Essential (primary) hypertension: Secondary | ICD-10-CM

## 2020-11-15 DIAGNOSIS — N183 Chronic kidney disease, stage 3 unspecified: Secondary | ICD-10-CM

## 2020-11-16 LAB — CBC WITH DIFFERENTIAL/PLATELET
Basophils Absolute: 0 10*3/uL (ref 0.0–0.2)
Basos: 0 %
EOS (ABSOLUTE): 0.1 10*3/uL (ref 0.0–0.4)
Eos: 2 %
Hematocrit: 47.1 % (ref 37.5–51.0)
Hemoglobin: 15.7 g/dL (ref 13.0–17.7)
Immature Grans (Abs): 0 10*3/uL (ref 0.0–0.1)
Immature Granulocytes: 0 %
Lymphocytes Absolute: 1.2 10*3/uL (ref 0.7–3.1)
Lymphs: 25 %
MCH: 29.6 pg (ref 26.6–33.0)
MCHC: 33.3 g/dL (ref 31.5–35.7)
MCV: 89 fL (ref 79–97)
Monocytes Absolute: 0.4 10*3/uL (ref 0.1–0.9)
Monocytes: 9 %
Neutrophils Absolute: 2.9 10*3/uL (ref 1.4–7.0)
Neutrophils: 64 %
Platelets: 144 10*3/uL — ABNORMAL LOW (ref 150–450)
RBC: 5.3 x10E6/uL (ref 4.14–5.80)
RDW: 13.2 % (ref 11.6–15.4)
WBC: 4.6 10*3/uL (ref 3.4–10.8)

## 2020-11-16 LAB — COMPREHENSIVE METABOLIC PANEL
ALT: 33 IU/L (ref 0–44)
AST: 21 IU/L (ref 0–40)
Albumin/Globulin Ratio: 2.7 — ABNORMAL HIGH (ref 1.2–2.2)
Albumin: 4.6 g/dL (ref 3.7–4.7)
Alkaline Phosphatase: 59 IU/L (ref 44–121)
BUN/Creatinine Ratio: 17 (ref 10–24)
BUN: 17 mg/dL (ref 8–27)
Bilirubin Total: 0.6 mg/dL (ref 0.0–1.2)
CO2: 23 mmol/L (ref 20–29)
Calcium: 9.1 mg/dL (ref 8.6–10.2)
Chloride: 104 mmol/L (ref 96–106)
Creatinine, Ser: 1.02 mg/dL (ref 0.76–1.27)
Globulin, Total: 1.7 g/dL (ref 1.5–4.5)
Glucose: 85 mg/dL (ref 70–99)
Potassium: 4.7 mmol/L (ref 3.5–5.2)
Sodium: 142 mmol/L (ref 134–144)
Total Protein: 6.3 g/dL (ref 6.0–8.5)
eGFR: 78 mL/min/{1.73_m2} (ref >59)

## 2020-11-25 ENCOUNTER — Other Ambulatory Visit: Payer: Self-pay

## 2020-11-25 ENCOUNTER — Ambulatory Visit (INDEPENDENT_AMBULATORY_CARE_PROVIDER_SITE_OTHER): Payer: No Typology Code available for payment source | Admitting: Internal Medicine

## 2020-11-25 ENCOUNTER — Encounter: Payer: Self-pay | Admitting: Internal Medicine

## 2020-11-25 VITALS — BP 126/75 | HR 62 | Temp 98.3°F | Ht 68.9 in | Wt 235.6 lb

## 2020-11-25 DIAGNOSIS — E782 Mixed hyperlipidemia: Secondary | ICD-10-CM | POA: Diagnosis not present

## 2020-11-25 DIAGNOSIS — R351 Nocturia: Secondary | ICD-10-CM | POA: Diagnosis not present

## 2020-11-25 DIAGNOSIS — Z23 Encounter for immunization: Secondary | ICD-10-CM | POA: Diagnosis not present

## 2020-11-25 DIAGNOSIS — I10 Essential (primary) hypertension: Secondary | ICD-10-CM

## 2020-11-25 NOTE — Progress Notes (Signed)
BP 126/75   Pulse 62   Temp 98.3 F (36.8 C) (Oral)   Ht 5' 8.9" (1.75 m)   Wt 235 lb 9.6 oz (106.9 kg)   SpO2 98%   BMI 34.90 kg/m    Subjective:    Patient ID: Logan Willis, male    DOB: [birth year], 75 y.o.   MRN: [id number]  Chief Complaint  Patient presents with   Hypertension   Chronic Kidney Disease    HPI: Logan Willis is a 75 y.o. male  Hypertension This is a chronic problem. The current episode started more than 1 year ago. The problem has been gradually improving since onset. The problem is controlled. Pertinent negatives include no anxiety, blurred vision, malaise/fatigue, neck pain, peripheral edema or PND.  Urinary Frequency  This is a chronic (pt has nocutria seen urology dr Bernardo Heater recently.) problem. The problem has been gradually worsening. Associated symptoms include frequency.   Chief Complaint  Patient presents with   Hypertension   Chronic Kidney Disease    Relevant past medical, surgical, family and social history reviewed and updated as indicated. Interim medical history since our last visit reviewed. Allergies and medications reviewed and updated.  Review of Systems  Constitutional:  Negative for malaise/fatigue.  Eyes:  Negative for blurred vision.  Cardiovascular:  Negative for PND.  Genitourinary:  Positive for frequency.  Musculoskeletal:  Negative for neck pain.   Per HPI unless specifically indicated above     Objective:    BP 126/75   Pulse 62   Temp 98.3 F (36.8 C) (Oral)   Ht 5' 8.9" (1.75 m)   Wt 235 lb 9.6 oz (106.9 kg)   SpO2 98%   BMI 34.90 kg/m   Wt Readings from Last 3 Encounters:  11/25/20 235 lb 9.6 oz (106.9 kg)  11/03/20 230 lb (104.3 kg)  10/14/20 236 lb 6.4 oz (107.2 kg)    Physical Exam Vitals and nursing note reviewed.  Constitutional:      General: He is not in acute distress.    Appearance: Normal appearance. He is not ill-appearing or diaphoretic.  HENT:     Head: Normocephalic and  atraumatic.     Right Ear: Tympanic membrane and external ear normal. There is no impacted cerumen.     Left Ear: External ear normal.     Nose: No congestion or rhinorrhea.     Mouth/Throat:     Pharynx: No oropharyngeal exudate or posterior oropharyngeal erythema.  Eyes:     Conjunctiva/sclera: Conjunctivae normal.     Pupils: Pupils are equal, round, and reactive to light.  Cardiovascular:     Rate and Rhythm: Normal rate and regular rhythm.     Heart sounds: No murmur heard.   No friction rub. No gallop.  Pulmonary:     Effort: No respiratory distress.     Breath sounds: No stridor. No wheezing or rhonchi.  Chest:     Chest wall: No tenderness.  Abdominal:     General: Abdomen is flat. Bowel sounds are normal.     Palpations: Abdomen is soft. There is no mass.     Tenderness: There is no abdominal tenderness.  Musculoskeletal:     Cervical back: Normal range of motion and neck supple. No rigidity or tenderness.     Left lower leg: No edema.  Skin:    General: Skin is warm and dry.  Neurological:     Mental Status: He is alert.    Results  for orders placed or performed in visit on 11/15/20  CBC w/Diff  Result Value Ref Range   WBC 4.6 3.4 - 10.8 x10E3/uL   RBC 5.30 4.14 - 5.80 x10E6/uL   Hemoglobin 15.7 13.0 - 17.7 g/dL   Hematocrit 47.1 37.5 - 51.0 %   MCV 89 79 - 97 fL   MCH 29.6 26.6 - 33.0 pg   MCHC 33.3 31.5 - 35.7 g/dL   RDW 13.2 11.6 - 15.4 %   Platelets 144 (L) 150 - 450 x10E3/uL   Neutrophils 64 Not Estab. %   Lymphs 25 Not Estab. %   Monocytes 9 Not Estab. %   Eos 2 Not Estab. %   Basos 0 Not Estab. %   Neutrophils Absolute 2.9 1.4 - 7.0 x10E3/uL   Lymphocytes Absolute 1.2 0.7 - 3.1 x10E3/uL   Monocytes Absolute 0.4 0.1 - 0.9 x10E3/uL   EOS (ABSOLUTE) 0.1 0.0 - 0.4 x10E3/uL   Basophils Absolute 0.0 0.0 - 0.2 x10E3/uL   Immature Granulocytes 0 Not Estab. %   Immature Grans (Abs) 0.0 0.0 - 0.1 x10E3/uL  Comprehensive metabolic panel  Result Value Ref  Range   Glucose 85 70 - 99 mg/dL   BUN 17 8 - 27 mg/dL   Creatinine, Ser 1.02 0.76 - 1.27 mg/dL   eGFR 78 >59 mL/min/1.73   BUN/Creatinine Ratio 17 10 - 24   Sodium 142 134 - 144 mmol/L   Potassium 4.7 3.5 - 5.2 mmol/L   Chloride 104 96 - 106 mmol/L   CO2 23 20 - 29 mmol/L   Calcium 9.1 8.6 - 10.2 mg/dL   Total Protein 6.3 6.0 - 8.5 g/dL   Albumin 4.6 3.7 - 4.7 g/dL   Globulin, Total 1.7 1.5 - 4.5 g/dL   Albumin/Globulin Ratio 2.7 (H) 1.2 - 2.2   Bilirubin Total 0.6 0.0 - 1.2 mg/dL   Alkaline Phosphatase 59 44 - 121 IU/L   AST 21 0 - 40 IU/L   ALT 33 0 - 44 IU/L        Current Outpatient Medications:    benazepril (LOTENSIN) 10 MG tablet, Take 1 tablet (10 mg total) by mouth daily., Disp: 90 tablet, Rfl: 4   fexofenadine-pseudoephedrine (ALLEGRA-D 24) 180-240 MG 24 hr tablet, Take 1 tablet by mouth daily as needed., Disp: , Rfl:    ketoconazole (NIZORAL) 2 % cream, Apply to feet QHS, Disp: 60 g, Rfl: 6   rosuvastatin (CRESTOR) 20 MG tablet, Take 1 tablet (20 mg total) by mouth daily., Disp: 90 tablet, Rfl: 3   tamsulosin (FLOMAX) 0.4 MG CAPS capsule, Take 1 capsule (0.4 mg total) by mouth daily., Disp: 90 capsule, Rfl: 4   triamcinolone cream (KENALOG) 0.1 %, Apply 1 application topically 2 (two) times daily. , Disp: , Rfl:    valACYclovir (VALTREX) 500 MG tablet, Take 2 tablets (1,000 mg total) by mouth 2 (two) times daily., Disp: 28 tablet, Rfl: 12    Assessment & Plan:   HTN : on benazpril 10 mg daily.  Continue current meds.  Medication compliance emphasised. pt advised to keep Bp logs. Pt verbalised understanding of the same. Pt to have a low salt diet . Exercise to reach a goal of at least 150 mins a week.  lifestyle modifications explained and pt understands importance of the above.  Nocturia ? Sec to need to void  2-3 times at night. Is on flomax for such   Sleep apnea ? Declines sleep study Per urology notes pt ?  Has sleep apnea  Pt will need to decide if he wants  a sleep study, declines this at this point.   Obesity : Lifestyle modifications advised to pt. A1c at   Portion control and avoiding high carb low fat diet advised.  Diet plan given to pt   exercise plan given and encouraged.  To increase exercise to 150 mins a week ie 21/2 hours a week. Pt verbalises understanding of the above.   Body mass index is 34.9 kg/m.   HLD  HLD recheck FLP, check LFT's work on diet, SE of meds explained to pt. low fat and high fiber diet explained to pt.   Problem List Items Addressed This Visit       Cardiovascular and Mediastinum   Essential hypertension     Other   Mixed hyperlipidemia   Nocturia   Need for shingles vaccine - Primary   Relevant Orders   Varicella-zoster vaccine IM (Shingrix) (Completed)     Orders Placed This Encounter  Procedures   Varicella-zoster vaccine IM (Shingrix)     No orders of the defined types were placed in this encounter.    Follow up plan: No follow-ups on file.

## 2021-03-01 ENCOUNTER — Encounter: Payer: Self-pay | Admitting: Internal Medicine

## 2021-03-01 ENCOUNTER — Other Ambulatory Visit: Payer: Self-pay

## 2021-03-01 ENCOUNTER — Ambulatory Visit: Payer: BC Managed Care – PPO | Admitting: Internal Medicine

## 2021-03-01 VITALS — BP 111/69 | HR 64 | Temp 98.5°F | Ht 68.9 in | Wt 238.4 lb

## 2021-03-01 DIAGNOSIS — H1033 Unspecified acute conjunctivitis, bilateral: Secondary | ICD-10-CM | POA: Diagnosis not present

## 2021-03-01 MED ORDER — BACITRACIN-POLYMYXIN B 500-10000 UNIT/GM OP OINT: 1.0000 "application " | TOPICAL_OINTMENT | Freq: Four times a day (QID) | OPHTHALMIC | 0 refills | Status: DC

## 2021-03-01 NOTE — Patient Instructions (Signed)
Bacterial Conjunctivitis, Adult Bacterial conjunctivitis is an infection of your conjunctiva. This is the clear membrane that covers the white part of your eye and the inner part of your eyelid. This infection can make your eye: Red or pink. Itchy or irritated. This condition spreads easily from person to person (is contagious) and from one eye to the other eye. What are the causes? This condition is caused by germs (bacteria). You may get the infection if you come into close contact with: A person who has the infection. Items that have germs on them (are contaminated), such as face towels, contact lens solution, or eye makeup. What increases the risk? You are more likely to get this condition if: You have contact with people who have the infection. You wear contact lenses. You have a sinus infection. You have had a recent eye injury or surgery. You have a weak body defense system (immune system). You have dry eyes. What are the signs or symptoms?  Thick, yellowish discharge from the eye. Tearing or watery eyes. Itchy eyes. Burning feeling in your eyes. Eye redness. Swollen eyelids. Blurred vision. How is this treated?  Antibiotic eye drops or ointment. Antibiotic medicine taken by mouth. This is used for infections that do not get better with drops or ointment or that last more than 10 days. Cool, wet cloths placed on the eyes. Artificial tears used 2-6 times a day. Follow these instructions at home: Medicines Take or apply your antibiotic medicine as told by your doctor. Do not stop using it even if you start to feel better. Take or apply over-the-counter and prescription medicines only as told by your doctor. Do not touch your eyelid with the eye-drop bottle or the ointment tube. Managing discomfort Wipe any fluid from your eye with a warm, wet washcloth or a cotton ball. Place a clean, cool, wet cloth on your eye. Do this for 10-20 minutes, 3-4 times a day. General  instructions Do not wear contacts until the infection is gone. Wear glasses until your doctor says it is okay to wear contacts again. Do not wear eye makeup until the infection is gone. Throw away old eye makeup. Change or wash your pillowcase every day. Do not share towels or washcloths. Wash your hands often with soap and water for at least 20 seconds and especially before touching your face or eyes. Use paper towels to dry your hands. Do not touch or rub your eyes. Do not drive or use heavy machinery if your vision is blurred. Contact a doctor if: You have a fever. You do not get better after 10 days. Get help right away if: You have a fever and your symptoms get worse all of a sudden. You have very bad pain when you move your eye. Your face: Hurts. Is red. Is swollen. You have sudden loss of vision. Summary Bacterial conjunctivitis is an infection of your conjunctiva. This infection spreads easily from person to person. Wash your hands often with soap and water for at least 20 seconds and especially before touching your face or eyes. Use paper towels to dry your hands. Take or apply your antibiotic medicine as told by your doctor. Contact a doctor if you have a fever or you do not get better after 10 days. This information is not intended to replace advice given to you by your health care provider. Make sure you discuss any questions you have with your health care provider. Document Revised: 04/07/2020 Document Reviewed: 04/07/2020 Elsevier Patient Education    2022 [employer]  

## 2021-03-01 NOTE — Progress Notes (Signed)
BP 111/69    Pulse 64    Temp 98.5 F (36.9 C) (Oral)    Ht 5' 8.9" (1.75 m)    Wt 238 lb 6.4 oz (108.1 kg)    SpO2 98%    BMI 35.31 kg/m    Subjective:    Patient ID: Logan Willis, male    DOB: November 03, 1945, 76 y.o.   MRN: [id number]  Chief Complaint  Patient presents with   Eye irritation    Patient states that his eyes have been irritated and feeling heavy for the past 5 days.     HPI: NAFTOLI PENNY is a 76 y.o. male  Patient presents with: Eye irritation: Patient states that his eyes have been irritated and feeling heavy for the past 5 days.     Conjunctivitis  The current episode started 5 to 7 days ago. The onset was gradual. The problem occurs frequently. The problem has been gradually worsening. The problem is mild. Associated symptoms include eye itching, eye discharge and eye redness. Pertinent negatives include no decreased vision, no double vision, no photophobia, no congestion, no ear discharge, no ear pain, no headaches, no hearing loss, no mouth sores, no rhinorrhea, no sore throat, no swollen glands and no eye pain.   Chief Complaint  Patient presents with   Eye irritation    Patient states that his eyes have been irritated and feeling heavy for the past 5 days.     Relevant past medical, surgical, family and social history reviewed and updated as indicated. Interim medical history since our last visit reviewed. Allergies and medications reviewed and updated.  Review of Systems  HENT:  Negative for congestion, ear discharge, ear pain, hearing loss, mouth sores, rhinorrhea and sore throat.   Eyes:  Positive for discharge, redness and itching. Negative for double vision, photophobia and pain.  Neurological:  Negative for headaches.   Per HPI unless specifically indicated above     Objective:    BP 111/69    Pulse 64    Temp 98.5 F (36.9 C) (Oral)    Ht 5' 8.9" (1.75 m)    Wt 238 lb 6.4 oz (108.1 kg)    SpO2 98%    BMI 35.31 kg/m   Wt Readings from  Last 3 Encounters:  03/01/21 238 lb 6.4 oz (108.1 kg)  11/25/20 235 lb 9.6 oz (106.9 kg)  11/03/20 230 lb (104.3 kg)    Physical Exam Eyes:     General: Lids are normal.        Right eye: No foreign body, discharge or hordeolum.        Left eye: No foreign body or discharge.     Conjunctiva/sclera:     Right eye: Right conjunctiva is injected. No exudate.    Left eye: Left conjunctiva is injected. No exudate.  Results for orders placed or performed in visit on 11/15/20  CBC w/Diff  Result Value Ref Range   WBC 4.6 3.4 - 10.8 x10E3/uL   RBC 5.30 4.14 - 5.80 x10E6/uL   Hemoglobin 15.7 13.0 - 17.7 g/dL   Hematocrit 47.1 37.5 - 51.0 %   MCV 89 79 - 97 fL   MCH 29.6 26.6 - 33.0 pg   MCHC 33.3 31.5 - 35.7 g/dL   RDW 13.2 11.6 - 15.4 %   Platelets 144 (L) 150 - 450 x10E3/uL   Neutrophils 64 Not Estab. %   Lymphs 25 Not Estab. %   Monocytes 9 Not Estab. %  Eos 2 Not Estab. %   Basos 0 Not Estab. %   Neutrophils Absolute 2.9 1.4 - 7.0 x10E3/uL   Lymphocytes Absolute 1.2 0.7 - 3.1 x10E3/uL   Monocytes Absolute 0.4 0.1 - 0.9 x10E3/uL   EOS (ABSOLUTE) 0.1 0.0 - 0.4 x10E3/uL   Basophils Absolute 0.0 0.0 - 0.2 x10E3/uL   Immature Granulocytes 0 Not Estab. %   Immature Grans (Abs) 0.0 0.0 - 0.1 x10E3/uL  Comprehensive metabolic panel  Result Value Ref Range   Glucose 85 70 - 99 mg/dL   BUN 17 8 - 27 mg/dL   Creatinine, Ser 1.02 0.76 - 1.27 mg/dL   eGFR 78 >59 mL/min/1.73   BUN/Creatinine Ratio 17 10 - 24   Sodium 142 134 - 144 mmol/L   Potassium 4.7 3.5 - 5.2 mmol/L   Chloride 104 96 - 106 mmol/L   CO2 23 20 - 29 mmol/L   Calcium 9.1 8.6 - 10.2 mg/dL   Total Protein 6.3 6.0 - 8.5 g/dL   Albumin 4.6 3.7 - 4.7 g/dL   Globulin, Total 1.7 1.5 - 4.5 g/dL   Albumin/Globulin Ratio 2.7 (H) 1.2 - 2.2   Bilirubin Total 0.6 0.0 - 1.2 mg/dL   Alkaline Phosphatase 59 44 - 121 IU/L   AST 21 0 - 40 IU/L   ALT 33 0 - 44 IU/L        Current Outpatient Medications:     bacitracin-polymyxin b (POLYSPORIN) ophthalmic ointment, Place 1 application into both eyes 4 (four) times daily. apply to eye every 12 hours while awake, Disp: 3.5 g, Rfl: 0   benazepril (LOTENSIN) 10 MG tablet, Take 1 tablet (10 mg total) by mouth daily., Disp: 90 tablet, Rfl: 4   fexofenadine-pseudoephedrine (ALLEGRA-D 24) 180-240 MG 24 hr tablet, Take 1 tablet by mouth daily as needed., Disp: , Rfl:    rosuvastatin (CRESTOR) 20 MG tablet, Take 1 tablet (20 mg total) by mouth daily., Disp: 90 tablet, Rfl: 3   tamsulosin (FLOMAX) 0.4 MG CAPS capsule, Take 1 capsule (0.4 mg total) by mouth daily., Disp: 90 capsule, Rfl: 4   ketoconazole (NIZORAL) 2 % cream, Apply to feet QHS (Patient not taking: Reported on 03/01/2021), Disp: 60 g, Rfl: 6   triamcinolone cream (KENALOG) 0.1 %, Apply 1 application topically 2 (two) times daily.  (Patient not taking: Reported on 03/01/2021), Disp: , Rfl:    valACYclovir (VALTREX) 500 MG tablet, Take 2 tablets (1,000 mg total) by mouth 2 (two) times daily. (Patient not taking: Reported on 03/01/2021), Disp: 28 tablet, Rfl: 12    Assessment & Plan:  Conjunctivititis Will start pt on antibiotic eye drops  qid for such  Will need to fu if doesn't get better.   Problem List Items Addressed This Visit   None    No orders of the defined types were placed in this encounter.    Meds ordered this encounter  Medications   bacitracin-polymyxin b (POLYSPORIN) ophthalmic ointment    Sig: Place 1 application into both eyes 4 (four) times daily. apply to eye every 12 hours while awake    Dispense:  3.5 g    Refill:  0     Follow up plan: No follow-ups on file.

## 2021-03-24 ENCOUNTER — Other Ambulatory Visit: Payer: Self-pay | Admitting: Internal Medicine

## 2021-03-24 ENCOUNTER — Ambulatory Visit (INDEPENDENT_AMBULATORY_CARE_PROVIDER_SITE_OTHER): Payer: BC Managed Care – PPO | Admitting: *Deleted

## 2021-03-24 DIAGNOSIS — Z0000 Encounter for general adult medical examination without abnormal findings: Secondary | ICD-10-CM

## 2021-03-24 NOTE — Progress Notes (Signed)
? ?Subjective:  ? Logan Willis is a 76 y.o. male who presents for Medicare Annual/Subsequent preventive examination. ? ?I connected with  Logan Willis on 03/24/21 by a telephone enabled telemedicine application and verified that I am speaking with the correct person using two identifiers. ?  ?I discussed the limitations of evaluation and management by telemedicine. The patient expressed understanding and agreed to proceed. ? ?Patient location: home ? ?Provider location: Tele-Health not in office ? ? ?Review of Systems    ? ?Cardiac Risk Factors include: advanced age (>27mn, >>53women);hypertension;obesity (BMI >30kg/m2);male gender ? ?   ?Objective:  ?  ?Today's Vitals  ? ?There is no height or weight on file to calculate BMI. ? ?Advanced Directives 03/24/2021 04/29/2019 08/22/2018 08/19/2018 08/19/2018 12/10/2014  ?Does Patient Have a Medical Advance Directive? No No No No No No  ?Would patient like information on creating a medical advance directive? No - Patient declined - No - Patient declined No - Patient declined No - Patient declined No - patient declined information  ? ? ?Current Medications (verified) ?Outpatient Encounter Medications as of 03/24/2021  ?Medication Sig  ? bacitracin-polymyxin b (POLYSPORIN) ophthalmic ointment Place 1 application into both eyes 4 (four) times daily. apply to eye every 12 hours while awake  ? benazepril (LOTENSIN) 10 MG tablet Take 1 tablet (10 mg total) by mouth daily.  ? fexofenadine-pseudoephedrine (ALLEGRA-D 24) 180-240 MG 24 hr tablet Take 1 tablet by mouth daily as needed.  ? ketoconazole (NIZORAL) 2 % cream Apply to feet QHS  ? rosuvastatin (CRESTOR) 20 MG tablet Take 1 tablet (20 mg total) by mouth daily.  ? tamsulosin (FLOMAX) 0.4 MG CAPS capsule Take 1 capsule (0.4 mg total) by mouth daily.  ? triamcinolone cream (KENALOG) 0.1 % Apply 1 application. topically 2 (two) times daily.  ? valACYclovir (VALTREX) 500 MG tablet Take 2 tablets (1,000 mg total) by mouth 2  (two) times daily.  ? ?No facility-administered encounter medications on file as of 03/24/2021.  ? ? ?Allergies (verified) ?Patient has no known allergies.  ? ?History: ?Past Medical History:  ?Diagnosis Date  ? Actinic keratosis   ? Body mass index 35.0-35.9, adult   ? Cold sore   ? Hypertension   ? Kidney stone 08/2018  ? ?Past Surgical History:  ?Procedure Laterality Date  ? COLON SURGERY    ? colectomy 2008  ? COLONOSCOPY    ? COLONOSCOPY WITH PROPOFOL N/A 12/10/2014  ? Procedure: COLONOSCOPY WITH PROPOFOL;  Surgeon: DLucilla Lame MD;  [facility]  Service: Endoscopy;  Laterality: N/A;  ? COLONOSCOPY WITH PROPOFOL N/A 04/29/2019  ? Procedure: COLONOSCOPY WITH PROPOFOL;  Surgeon: WLucilla Lame MD;  [facility]  Service: Endoscopy;  Laterality: N/A;  ? EXTRACORPOREAL SHOCK WAVE LITHOTRIPSY Left 08/22/2018  ? Procedure: EXTRACORPOREAL SHOCK WAVE LITHOTRIPSY (ESWL);  Surgeon: SBilley Co MD;  Location: [facility];  Service: Urology;  Laterality: Left;  ? HERNIA REPAIR    ? ?Family History  ?Problem Relation Age of Onset  ? Heart disease Mother 463 ? Heart disease Father 651 ? Diabetes Sister   ? Stroke Sister   ? Cancer Neg Hx   ? COPD Neg Hx   ? ?Social History  ? ?Socioeconomic History  ? Marital status: Married  ?  Spouse name: Not on file  ? Number of children: Not on file  ? Years of education: Not on file  ? Highest education level: Not on file  ?Occupational History  ?  Not on file  ?Tobacco Use  ? Smoking status: Never  ? Smokeless tobacco: Never  ?Vaping Use  ? Vaping Use: Never used  ?Substance and Sexual Activity  ? Alcohol use: No  ? Drug use: No  ? Sexual activity: Yes  ?  Birth control/protection: None  ?Other Topics Concern  ? Not on file  ?Social History Narrative  ? Not on file  ? ?Social Determinants of Health  ? ?Financial Resource Strain: Low Risk   ? Difficulty of Paying Living Expenses: Not hard at all  ?Food Insecurity: No Food Insecurity  ? Worried About Paediatric nurse in the Last Year: Never true  ? Ran Out of Food in the Last Year: Never true  ?Transportation Needs: No Transportation Needs  ? Lack of Transportation (Medical): No  ? Lack of Transportation (Non-Medical): No  ?Physical Activity: Insufficiently Active  ? Days of Exercise per Week: 2 days  ? Minutes of Exercise per Session: 20 min  ?Stress: No Stress Concern Present  ? Feeling of Stress : Not at all  ?Social Connections: Moderately Integrated  ? Frequency of Communication with Friends and Family: Once a week  ? Frequency of Social Gatherings with Friends and Family: Once a week  ? Attends Religious Services: More than 4 times per year  ? Active Member of Clubs or Organizations: Yes  ? Attends Archivist Meetings: More than 4 times per year  ? Marital Status: Married  ? ? ?Tobacco Counseling ?Counseling given: Not Answered ? ? ?Clinical Intake: ? ?Pre-visit preparation completed: Yes ? ?Pain : No/denies pain ? ?  ? ?Nutritional Risks: None ?Diabetes: No ? ?How often do you need to have someone help you when you read instructions, pamphlets, or other written materials from your doctor or pharmacy?: 1 - Never ? ?Diabetic?  no ? ?Interpreter Needed?: No ? ?Information entered by :: Leroy Kennedy LPN ? ? ?Activities of Daily Living ?In your present state of health, do you have any difficulty performing the following activities: 03/24/2021  ?Hearing? N  ?Vision? N  ?Difficulty concentrating or making decisions? N  ?Walking or climbing stairs? N  ?Dressing or bathing? N  ?Doing errands, shopping? N  ?Preparing Food and eating ? N  ?Using the Toilet? N  ?In the past six months, have you accidently leaked urine? N  ?Do you have problems with loss of bowel control? N  ?Managing your Medications? N  ?Managing your Finances? N  ?Some recent data might be hidden  ? ? ?Patient Care Team: ?Charlynne Cousins, MD as PCP - General ? ?Indicate any recent Medical Services you may have received from other than [facility]  providers in the past year (date may be approximate). ? ?   ?Assessment:  ? This is a routine wellness examination for Logan Willis. ? ?Hearing/Vision screen ?Hearing Screening - Comments:: No trouble hearing ?Vision Screening - Comments:: Up to date ?[place] ? ?Dietary issues and exercise activities discussed: ?Current Exercise Habits: Home exercise routine, Type of exercise: walking (yard work), Time (Minutes): 30, Frequency (Times/Week): 3, Weekly Exercise (Minutes/Week): 90, Intensity: Mild ? ? Goals Addressed   ? ?  ?  ?  ?  ? This Visit's Progress  ?  Patient Stated     ?  No goals ?  ? ?  ? ?Depression Screen ?PHQ 2/9 Scores 03/24/2021 03/01/2021 11/25/2020 03/03/2020 02/12/2019 02/05/2018 07/31/2017  ?PHQ - 2 Score 0 0 0 0 0 0 0  ?  PHQ- 9 Score 0 0 2 0 - 1 -  ?  ?Fall Risk ?Fall Risk  03/24/2021 03/01/2021 11/25/2020 10/14/2020 03/03/2020  ?Falls in the past year? 0 0 0 0 0  ?Number falls in past yr: 0 0 0 0 0  ?Injury with Fall? 0 0 0 0 0  ?Risk for fall due to : - No Fall Risks No Fall Risks No Fall Risks -  ?Follow up Falls evaluation completed;Education provided;Falls prevention discussed Falls evaluation completed Falls evaluation completed Falls evaluation completed Falls evaluation completed  ? ? ?FALL RISK PREVENTION PERTAINING TO THE HOME: ? ?Any stairs in or around the home? Yes  ?If so, are there any without handrails? No  ?Home free of loose throw rugs in walkways, pet beds, electrical cords, etc? Yes  ?Adequate lighting in your home to reduce risk of falls? Yes  ? ?ASSISTIVE DEVICES UTILIZED TO PREVENT FALLS: ? ?Life alert? No  ?Use of a cane, walker or w/c? No  ?Grab bars in the bathroom? No  ?Shower chair or bench in shower? No  ?Elevated toilet seat or a handicapped toilet? No  ? ?TIMED UP AND GO: ? ?Was the test performed? No .  ? ? ?Cognitive Function: ? ?Normal cognitive status assessed by direct observation by this Nurse Health Advisor. No abnormalities found.   ?  ?  ?   ? ?Immunizations ?Immunization History  ?Administered Date(s) Administered  ? Fluad Quad(high Dose 65+) 09/24/2018, 10/14/2020  ? Influenza, High Dose Seasonal PF 09/26/2016, 10/02/2017  ? Influenza-Unspecified 09/24/2014, 10/01/2

## 2021-03-24 NOTE — Patient Instructions (Signed)
Logan Willis , ?Thank you for taking time to come for your Medicare Wellness Visit. I appreciate your ongoing commitment to your health goals. Please review the following plan we discussed and let me know if I can assist you in the future.  ? ?Screening recommendations/referrals: ?Colonoscopy: up to date ?Recommended yearly ophthalmology/optometry visit for glaucoma screening and checkup ?Recommended yearly dental visit for hygiene and checkup ? ?Vaccinations: ?Influenza vaccine: up to date ?Pneumococcal vaccine: up to date ?Tdap vaccine: up to date ?Shingles vaccine: up to date  ? ?Advanced directives: Education provided ? ?Conditions/risks identified:  ? ?Next appointment: 05-16-2021 @ 8:40  [facility]    05-25-2021  @ 8:20  [facility] ? ?Preventive Care 63 Years and Older, Male ?Preventive care refers to lifestyle choices and visits with your health care provider that can promote health and wellness. ?What does preventive care include? ?A yearly physical exam. This is also called an annual well check. ?Dental exams once or twice a year. ?Routine eye exams. Ask your health care provider how often you should have your eyes checked. ?Personal lifestyle choices, including: ?Daily care of your teeth and gums. ?Regular physical activity. ?Eating a healthy diet. ?Avoiding tobacco and drug use. ?Limiting alcohol use. ?Practicing safe sex. ?Taking low doses of aspirin every day. ?Taking vitamin and mineral supplements as recommended by your health care provider. ?What happens during an annual well check? ?The services and screenings done by your health care provider during your annual well check will depend on your age, overall health, lifestyle risk factors, and family history of disease. ?Counseling  ?Your health care provider may ask you questions about your: ?Alcohol use. ?Tobacco use. ?Drug use. ?Emotional well-being. ?Home and relationship well-being. ?Sexual activity. ?Eating habits. ?History of falls. ?Memory and ability to  understand (cognition). ?Work and work Statistician. ?Screening  ?You may have the following tests or measurements: ?Height, weight, and BMI. ?Blood pressure. ?Lipid and cholesterol levels. These may be checked every 5 years, or more frequently if you are over 74 years old. ?Skin check. ?Lung cancer screening. You may have this screening every year starting at age 74 if you have a 30-pack-year history of smoking and currently smoke or have quit within the past 15 years. ?Fecal occult blood test (FOBT) of the stool. You may have this test every year starting at age 74. ?Flexible sigmoidoscopy or colonoscopy. You may have a sigmoidoscopy every 5 years or a colonoscopy every 10 years starting at age 74. ?Prostate cancer screening. Recommendations will vary depending on your family history and other risks. ?Hepatitis C blood test. ?Hepatitis B blood test. ?Sexually transmitted disease (STD) testing. ?Diabetes screening. This is done by checking your blood sugar (glucose) after you have not eaten for a while (fasting). You may have this done every 1-3 years. ?Abdominal aortic aneurysm (AAA) screening. You may need this if you are a current or former smoker. ?Osteoporosis. You may be screened starting at age 74 if you are at high risk. ?Talk with your health care provider about your test results, treatment options, and if necessary, the need for more tests. ?Vaccines  ?Your health care provider may recommend certain vaccines, such as: ?Influenza vaccine. This is recommended every year. ?Tetanus, diphtheria, and acellular pertussis (Tdap, Td) vaccine. You may need a Td booster every 10 years. ?Zoster vaccine. You may need this after age 74. ?Pneumococcal 13-valent conjugate (PCV13) vaccine. One dose is recommended after age 74. ?Pneumococcal polysaccharide (PPSV23) vaccine. One dose is recommended after age 74. ?Talk  to your health care provider about which screenings and vaccines you need and how often you need them. ?This  information is not intended to replace advice given to you by your health care provider. Make sure you discuss any questions you have with your health care provider. ?Document Released: 01/22/2015 Document Revised: 09/15/2015 Document Reviewed: 10/27/2014 ?Elsevier Interactive Patient Education ? 2017 [place]. ? ?Fall Prevention in the Home ?Falls can cause injuries. They can happen to people of all ages. There are many things you can do to make your home safe and to help prevent falls. ?What can I do on the outside of my home? ?Regularly fix the edges of walkways and driveways and fix any cracks. ?Remove anything that might make you trip as you walk through a door, such as a raised step or threshold. ?Trim any bushes or trees on the path to your home. ?Use bright outdoor lighting. ?Clear any walking paths of anything that might make someone trip, such as rocks or tools. ?Regularly check to see if handrails are loose or broken. Make sure that both sides of any steps have handrails. ?Any raised decks and porches should have guardrails on the edges. ?Have any leaves, snow, or ice cleared regularly. ?Use sand or salt on walking paths during winter. ?Clean up any spills in your garage right away. This includes oil or grease spills. ?What can I do in the bathroom? ?Use night lights. ?Install grab bars by the toilet and in the tub and shower. Do not use towel bars as grab bars. ?Use non-skid mats or decals in the tub or shower. ?If you need to sit down in the shower, use a plastic, non-slip stool. ?Keep the floor dry. Clean up any water that spills on the floor as soon as it happens. ?Remove soap buildup in the tub or shower regularly. ?Attach bath mats securely with double-sided non-slip rug tape. ?Do not have throw rugs and other things on the floor that can make you trip. ?What can I do in the bedroom? ?Use night lights. ?Make sure that you have a light by your bed that is easy to reach. ?Do not use any sheets or  blankets that are too big for your bed. They should not hang down onto the floor. ?Have a firm chair that has side arms. You can use this for support while you get dressed. ?Do not have throw rugs and other things on the floor that can make you trip. ?What can I do in the kitchen? ?Clean up any spills right away. ?Avoid walking on wet floors. ?Keep items that you use a lot in easy-to-reach places. ?If you need to reach something above you, use a strong step stool that has a grab bar. ?Keep electrical cords out of the way. ?Do not use floor polish or wax that makes floors slippery. If you must use wax, use non-skid floor wax. ?Do not have throw rugs and other things on the floor that can make you trip. ?What can I do with my stairs? ?Do not leave any items on the stairs. ?Make sure that there are handrails on both sides of the stairs and use them. Fix handrails that are broken or loose. Make sure that handrails are as long as the stairways. ?Check any carpeting to make sure that it is firmly attached to the stairs. Fix any carpet that is loose or worn. ?Avoid having throw rugs at the top or bottom of the stairs. If you do have throw rugs,  attach them to the floor with carpet tape. ?Make sure that you have a light switch at the top of the stairs and the bottom of the stairs. If you do not have them, ask someone to add them for you. ?What else can I do to help prevent falls? ?Wear shoes that: ?Do not have high heels. ?Have rubber bottoms. ?Are comfortable and fit you well. ?Are closed at the toe. Do not wear sandals. ?If you use a stepladder: ?Make sure that it is fully opened. Do not climb a closed stepladder. ?Make sure that both sides of the stepladder are locked into place. ?Ask someone to hold it for you, if possible. ?Clearly mark and make sure that you can see: ?Any grab bars or handrails. ?First and last steps. ?Where the edge of each step is. ?Use tools that help you move around (mobility aids) if they are  needed. These include: ?Canes. ?Walkers. ?Scooters. ?Crutches. ?Turn on the lights when you go into a dark area. Replace any light bulbs as soon as they burn out. ?Set up your furniture so you have a clear pat

## 2021-03-25 DIAGNOSIS — M3501 Sicca syndrome with keratoconjunctivitis: Secondary | ICD-10-CM | POA: Diagnosis not present

## 2021-04-28 ENCOUNTER — Ambulatory Visit (INDEPENDENT_AMBULATORY_CARE_PROVIDER_SITE_OTHER): Payer: BLUE CROSS/BLUE SHIELD | Admitting: Dermatology

## 2021-04-28 DIAGNOSIS — L578 Other skin changes due to chronic exposure to nonionizing radiation: Secondary | ICD-10-CM | POA: Diagnosis not present

## 2021-04-28 DIAGNOSIS — B353 Tinea pedis: Secondary | ICD-10-CM | POA: Diagnosis not present

## 2021-04-28 DIAGNOSIS — L570 Actinic keratosis: Secondary | ICD-10-CM | POA: Diagnosis not present

## 2021-04-28 NOTE — Progress Notes (Signed)
? ?  Follow-Up Visit ?  ?Subjective  ?Logan Willis is a 76 y.o. male who presents for the following: Actinic Keratosis (6 month follow up - AK follow up of scalp and face treated with LN2) and Follow-up (Tinea pedis/unguium follow up - Ketoconazole 2% cream qhs, prescribed 1 more month of Lamisil but he has not taken yet). ? ?The following portions of the chart were reviewed this encounter and updated as appropriate:  ? Tobacco  Allergies  Meds  Problems  Med Hx  Surg Hx  Fam Hx   ?  ?Review of Systems:  No other skin or systemic complaints except as noted in HPI or Assessment and Plan. ? ?Objective  ?Well appearing patient in no apparent distress; mood and affect are within normal limits. ? ?A focused examination was performed including scalp, face, feet. Relevant physical exam findings are noted in the Assessment and Plan. ? ?Bilateral feet ?Toenail dystrophy and scale of feet ? ?Scalp (2) ?Erythematous thin papules/macules with gritty scale.  ? ? ?Assessment & Plan  ? ?Actinic Damage ?- chronic, secondary to cumulative UV radiation exposure/sun exposure over time ?- diffuse scaly erythematous macules with underlying dyspigmentation ?- Recommend daily broad spectrum sunscreen SPF 30+ to sun-exposed areas, reapply every 2 hours as needed.  ?- Recommend staying in the shade or wearing long sleeves, sun glasses (UVA+UVB protection) and wide brim hats (4-inch brim around the entire circumference of the hat). ?- Call for new or changing lesions. ? ?Tinea pedis of both feet ?Bilateral feet ?Labs from 11/2020 reviewed and LFTs are WNL.  ?Start Terbinafine 250 mg 1 po qd ?Continue Ketoconazole 2% cream qhs ?Chronic and persistent condition with duration or expected duration over one year. Condition is symptomatic / bothersome to patient. Not to goal. ? ?AK (actinic keratosis) (2) ?Scalp ?Destruction of lesion - Scalp ?Complexity: simple   ?Destruction method: cryotherapy   ?Informed consent: discussed and consent  obtained   ?Timeout:  patient name, date of birth, surgical site, and procedure verified ?Lesion destroyed using liquid nitrogen: Yes   ?Region frozen until ice ball extended beyond lesion: Yes   ?Outcome: patient tolerated procedure well with no complications   ?Post-procedure details: wound care instructions given   ? ?Return in about 6 months (around 10/28/2021) for AK follow up, tinea follow up. ? ?I, Ashok Cordia, CMA, am acting as scribe for Sarina Ser, MD . ?Documentation: I have reviewed the above documentation for accuracy and completeness, and I agree with the above. ? ?Sarina Ser, MD ? ?

## 2021-04-28 NOTE — Patient Instructions (Addendum)

## 2021-04-29 DIAGNOSIS — M3501 Sicca syndrome with keratoconjunctivitis: Secondary | ICD-10-CM | POA: Diagnosis not present

## 2021-05-04 ENCOUNTER — Encounter: Payer: Self-pay | Admitting: Urology

## 2021-05-04 ENCOUNTER — Ambulatory Visit (INDEPENDENT_AMBULATORY_CARE_PROVIDER_SITE_OTHER): Payer: BC Managed Care – PPO | Admitting: Urology

## 2021-05-04 VITALS — BP 118/76 | HR 76 | Ht 70.0 in | Wt 235.0 lb

## 2021-05-04 DIAGNOSIS — N138 Other obstructive and reflux uropathy: Secondary | ICD-10-CM

## 2021-05-04 DIAGNOSIS — N401 Enlarged prostate with lower urinary tract symptoms: Secondary | ICD-10-CM

## 2021-05-04 DIAGNOSIS — R351 Nocturia: Secondary | ICD-10-CM

## 2021-05-04 DIAGNOSIS — R350 Frequency of micturition: Secondary | ICD-10-CM | POA: Diagnosis not present

## 2021-05-04 LAB — BLADDER SCAN AMB NON-IMAGING

## 2021-05-04 MED ORDER — TAMSULOSIN HCL 0.4 MG PO CAPS: 0.4000 mg | ORAL_CAPSULE | Freq: Every day | ORAL | 4 refills | Status: DC

## 2021-05-04 NOTE — Patient Instructions (Signed)
Sleep Apnea Sleep apnea is a condition in which breathing pauses or becomes shallow during sleep. People with sleep apnea usually snore loudly. They may have times when they gasp and stop breathing for 10 seconds or more during sleep. This may happen many times during the night. Sleep apnea disrupts your sleep and keeps your body from getting the rest that it needs. This condition can increase your risk of certain health problems, including: Heart attack. Stroke. Obesity. Type 2 diabetes. Heart failure. Irregular heartbeat. High blood pressure. The goal of treatment is to help you breathe normally again. What are the causes?  The most common cause of sleep apnea is a collapsed or blocked airway. There are three kinds of sleep apnea: Obstructive sleep apnea. This kind is caused by a blocked or collapsed airway. Central sleep apnea. This kind happens when the part of the brain that controls breathing does not send the correct signals to the muscles that control breathing. Mixed sleep apnea. This is a combination of obstructive and central sleep apnea. What increases the risk? You are more likely to develop this condition if you: Are overweight. Smoke. Have a smaller than normal airway. Are older. Are male. Drink alcohol. Take sedatives or tranquilizers. Have a family history of sleep apnea. Have a tongue or tonsils that are larger than normal. What are the signs or symptoms? Symptoms of this condition include: Trouble staying asleep. Loud snoring. Morning headaches. Waking up gasping. Dry mouth or sore throat in the morning. Daytime sleepiness and tiredness. If you have daytime fatigue because of sleep apnea, you may be more likely to have: Trouble concentrating. Forgetfulness. Irritability or mood swings. Personality changes. Feelings of depression. Sexual dysfunction. This may include loss of interest if you are male, or erectile dysfunction if you are male. How is this  diagnosed? This condition may be diagnosed with: A medical history. A physical exam. A series of tests that are done while you are sleeping (sleep study). These tests are usually done in a sleep lab, but they may also be done at home. How is this treated? Treatment for this condition aims to restore normal breathing and to ease symptoms during sleep. It may involve managing health issues that can affect breathing, such as high blood pressure or obesity. Treatment may include: Sleeping on your side. Using a decongestant if you have nasal congestion. Avoiding the use of depressants, including alcohol, sedatives, and narcotics. Losing weight if you are overweight. Making changes to your diet. Quitting smoking. Using a device to open your airway while you sleep, such as: An oral appliance. This is a custom-made mouthpiece that shifts your lower jaw forward. A continuous positive airway pressure (CPAP) device. This device blows air through a mask when you breathe out (exhale). A nasal expiratory positive airway pressure (EPAP) device. This device has valves that you put into each nostril. A bi-level positive airway pressure (BIPAP) device. This device blows air through a mask when you breathe in (inhale) and breathe out (exhale). Having surgery if other treatments do not work. During surgery, excess tissue is removed to create a wider airway. Follow these instructions at home: Lifestyle Make any lifestyle changes that your health care provider recommends. Eat a healthy, well-balanced diet. Take steps to lose weight if you are overweight. Avoid using depressants, including alcohol, sedatives, and narcotics. Do not use any products that contain nicotine or tobacco. These products include cigarettes, chewing tobacco, and vaping devices, such as e-cigarettes. If you need help quitting, ask   your health care provider. General instructions Take over-the-counter and prescription medicines only as told  by your health care provider. If you were given a device to open your airway while you sleep, use it only as told by your health care provider. If you are having surgery, make sure to tell your health care provider you have sleep apnea. You may need to bring your device with you. Keep all follow-up visits. This is important. Contact a health care provider if: The device that you received to open your airway during sleep is uncomfortable or does not seem to be working. Your symptoms do not improve. Your symptoms get worse. Get help right away if: You develop: Chest pain. Shortness of breath. Discomfort in your back, arms, or stomach. You have: Trouble speaking. Weakness on one side of your body. Drooping in your face. These symptoms may represent a serious problem that is an emergency. Do not wait to see if the symptoms will go away. Get medical help right away. Call your local emergency services (911 in the [place] Do not drive yourself to the hospital. Summary Sleep apnea is a condition in which breathing pauses or becomes shallow during sleep. The most common cause is a collapsed or blocked airway. The goal of treatment is to restore normal breathing and to ease symptoms during sleep. This information is not intended to replace advice given to you by your health care provider. Make sure you discuss any questions you have with your health care provider. Document Revised: 08/04/2020 Document Reviewed: 12/05/2019 Elsevier Patient Education  2023 [employer]  

## 2021-05-04 NOTE — Progress Notes (Signed)
? ?  05/04/2021 ?1:11 PM  ? ?Logan Willis ?[birth year] ?[id number] ? ?Reason for visit: Follow up nocturia, history of kidney stones ? ?HPI: ?76 year old male who underwent an uncomplicated left shockwave lithotripsy in 2020 for an 8 mm proximal ureteral stone, no stone events since that time.  He has had some worsening of his urinary symptoms primarily nocturia 2-3 times overnight, has been on Flomax long-term.  He really denies significant urinary problems during the day, and at her last visit in October 2022 UA was benign and PVR was normal at 45 mL.  Prostate measured 57 g on CT from 2020 with a nondistended bladder, PSA has been normal, most recently 2.6 in February 2022 which is stable from prior values.  At our last visit I recommended evaluation for sleep apnea with his heavy snoring and primarily nocturia urinary complaint.  He was never evaluated for sleep apnea and does not think he would do well with a CPAP machine. ? ?We again reviewed behavioral strategies today including avoiding bladder irritants, minimizing fluids 4 hours prior to bedtime, voiding prior to bed, and monitoring for lower extremity edema in the evenings.  I again strongly recommended evaluation for sleep apnea as I think this would be very beneficial for him.  He remains undecided, and is concerned that he may not wake up if someone called him overnight for work. ? ?Continue Flomax ?RTC 1 year PVR, KUB for stone surveillance prior ? ? ?Billey Co, MD ? ?[facility] ?[address], [address] ?[place], [place] [address] ?[phone] ? ? ?

## 2021-05-07 ENCOUNTER — Encounter: Payer: Self-pay | Admitting: Dermatology

## 2021-05-10 ENCOUNTER — Encounter: Payer: Self-pay | Admitting: Family Medicine

## 2021-05-10 ENCOUNTER — Ambulatory Visit (INDEPENDENT_AMBULATORY_CARE_PROVIDER_SITE_OTHER): Payer: BC Managed Care – PPO | Admitting: Family Medicine

## 2021-05-10 VITALS — BP 125/74 | HR 68 | Temp 97.7°F | Wt 239.0 lb

## 2021-05-10 DIAGNOSIS — J0100 Acute maxillary sinusitis, unspecified: Secondary | ICD-10-CM | POA: Diagnosis not present

## 2021-05-10 MED ORDER — PREDNISONE 50 MG PO TABS: 50.0000 mg | ORAL_TABLET | Freq: Every day | ORAL | 0 refills | Status: DC

## 2021-05-10 MED ORDER — AMOXICILLIN-POT CLAVULANATE 875-125 MG PO TABS: 1.0000 | ORAL_TABLET | Freq: Two times a day (BID) | ORAL | 0 refills | Status: DC

## 2021-05-10 NOTE — Progress Notes (Signed)
? ?BP 125/74   Pulse 68   Temp 97.7 ?F (36.5 ?C)   Wt 239 lb (108.4 kg)   SpO2 98%   BMI 34.29 kg/m?   ? ?Subjective:  ? ? Patient ID: Logan Willis, male    DOB: 12/09/1945, 76 y.o.   MRN: [id number] ? ?HPI: ?Logan Willis is a 76 y.o. male ? ?Chief Complaint  ?Patient presents with  ? URI  ?  Patient states he has nasal congestion, sneezing and some coughing. Patient states symptoms began about 5 days.   ? ?UPPER RESPIRATORY TRACT INFECTION ?Duration: 5 days ?Worst symptom: ?Fever: no ?Cough: yes ?Shortness of breath: no ?Wheezing: no ?Chest pain: no ?Chest tightness: no ?Chest congestion: no ?Nasal congestion: yes ?Runny nose: no ?Post nasal drip: yes ?Sneezing: no ?Sore throat: no ?Swollen glands: no ?Sinus pressure: yes ?Headache: yes ?Face pain: no ?Toothache: no ?Ear pain: no  ?Ear pressure: no  ?Eyes red/itching:no ?Eye drainage/crusting: no  ?Vomiting: no ?Rash: no ?Fatigue: yes ?Sick contacts: no ?Strep contacts: no  ?Context: stable ?Recurrent sinusitis: no ?Relief with OTC cold/cough medications: no  ?Treatments attempted: cold/sinus  ? ?Relevant past medical, surgical, family and social history reviewed and updated as indicated. Interim medical history since our last visit reviewed. ?Allergies and medications reviewed and updated. ? ?Review of Systems  ?Constitutional: Negative.   ?HENT:  Positive for congestion, ear pain, postnasal drip and sinus pressure. Negative for dental problem, drooling, ear discharge, facial swelling, hearing loss, mouth sores, nosebleeds, rhinorrhea, sinus pain, sneezing, sore throat, tinnitus, trouble swallowing and voice change.   ?Eyes: Negative.   ?Respiratory:  Positive for cough. Negative for apnea, choking, chest tightness, shortness of breath, wheezing and stridor.   ?Cardiovascular: Negative.   ?Gastrointestinal: Negative.   ?Musculoskeletal: Negative.   ?Psychiatric/Behavioral: Negative.    ? ?Per HPI unless specifically indicated above ? ?   ?Objective:  ?   ?BP 125/74   Pulse 68   Temp 97.7 ?F (36.5 ?C)   Wt 239 lb (108.4 kg)   SpO2 98%   BMI 34.29 kg/m?   ?Wt Readings from Last 3 Encounters:  ?05/10/21 239 lb (108.4 kg)  ?05/04/21 235 lb (106.6 kg)  ?03/01/21 238 lb 6.4 oz (108.1 kg)  ?  ?Physical Exam ?Vitals and nursing note reviewed.  ?Constitutional:   ?   General: He is not in acute distress. ?   Appearance: Normal appearance. He is obese. He is not ill-appearing, toxic-appearing or diaphoretic.  ?HENT:  ?   Head: Normocephalic and atraumatic.  ?   Right Ear: Tympanic membrane, ear canal and external ear normal.  ?   Left Ear: Tympanic membrane, ear canal and external ear normal.  ?   Nose: Congestion and rhinorrhea present.  ?   Mouth/Throat:  ?   Mouth: Mucous membranes are moist.  ?   Pharynx: Oropharynx is clear.  ?Eyes:  ?   General: No scleral icterus.    ?   Right eye: No discharge.     ?   Left eye: No discharge.  ?   Extraocular Movements: Extraocular movements intact.  ?   Conjunctiva/sclera: Conjunctivae normal.  ?   Pupils: Pupils are equal, round, and reactive to light.  ?Cardiovascular:  ?   Rate and Rhythm: Normal rate and regular rhythm.  ?   Pulses: Normal pulses.  ?   Heart sounds: Normal heart sounds. No murmur heard. ?  No friction rub. No gallop.  ?Pulmonary:  ?  Effort: Pulmonary effort is normal. No respiratory distress.  ?   Breath sounds: Normal breath sounds. No stridor. No wheezing, rhonchi or rales.  ?Chest:  ?   Chest wall: No tenderness.  ?Musculoskeletal:     ?   General: Normal range of motion.  ?   Cervical back: Normal range of motion and neck supple.  ?Lymphadenopathy:  ?   Cervical: Cervical adenopathy present.  ?Skin: ?   General: Skin is warm and dry.  ?   Capillary Refill: Capillary refill takes less than 2 seconds.  ?   Coloration: Skin is not jaundiced or pale.  ?   Findings: No bruising, erythema, lesion or rash.  ?Neurological:  ?   General: No focal deficit present.  ?   Mental Status: He is alert and oriented to  person, place, and time. Mental status is at baseline.  ?Psychiatric:     ?   Mood and Affect: Mood normal.     ?   Behavior: Behavior normal.     ?   Thought Content: Thought content normal.     ?   Judgment: Judgment normal.  ? ? ?Results for orders placed or performed in visit on 05/04/21  ?BLADDER SCAN AMB NON-IMAGING  ?Result Value Ref Range  ? Scan Result 60m   ? ?   ?Assessment & Plan:  ? ?Problem List Items Addressed This Visit   ?None ?Visit Diagnoses   ? ? Acute non-recurrent maxillary sinusitis    -  Primary  ? Will treat with prednisone and augmentin. Call with any concerns or if not getting better.   ? Relevant Medications  ? predniSONE (DELTASONE) 50 MG tablet  ? amoxicillin-clavulanate (AUGMENTIN) 875-125 MG tablet  ? ?  ?  ? ?Follow up plan: ?Return if symptoms worsen or fail to improve. ? ? ? ? ? ?

## 2021-05-13 IMAGING — CT CT RENAL STONE PROTOCOL
2 of 4 series · 17 of 46 positions shown, 19 images · non-contrast
Comparison: 08/04/2015

CLINICAL DATA: Sudden onset left flank pain

EXAM:
CT ABDOMEN AND PELVIS WITHOUT CONTRAST
TECHNIQUE: Multidetector CT imaging of the abdomen and pelvis was performed
following the standard protocol without IV contrast.

[Series 2: stone full standard · axial · 0.84mm/px · z∈[-979,-519]mm · 14 of 102 slices shown, 16 images]
[im 5/102  soft-tissue]
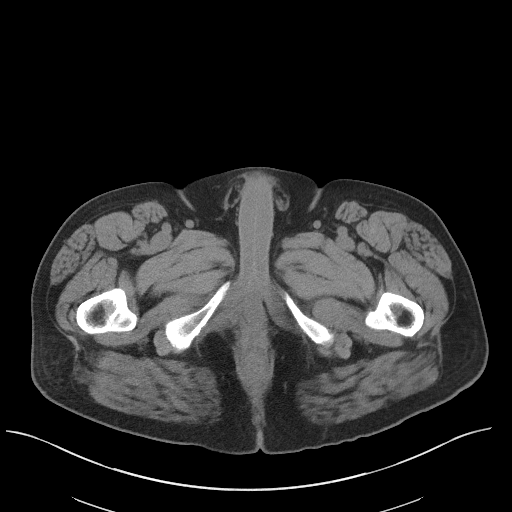
[im 5/102  bone]
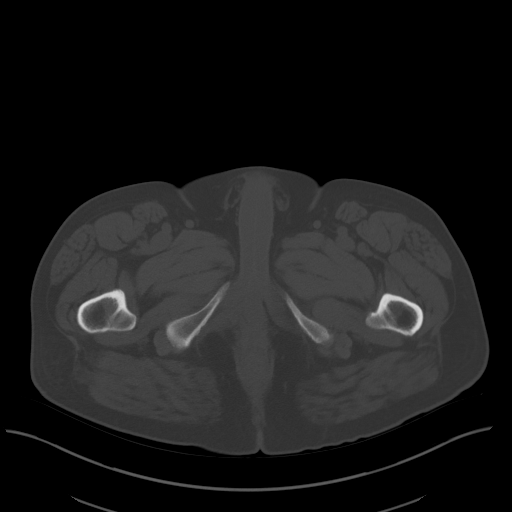
[im 14/102  soft-tissue]
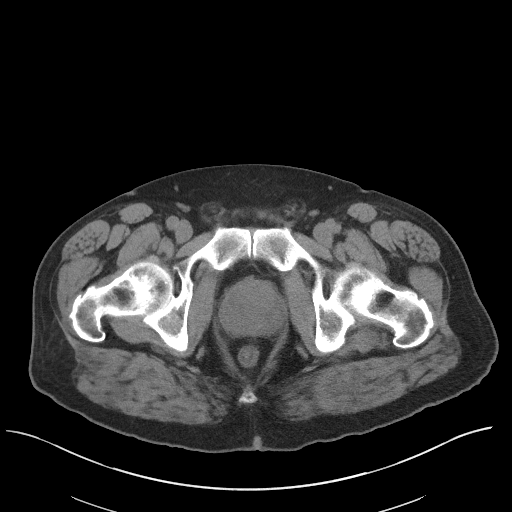
[im 18/102  soft-tissue]
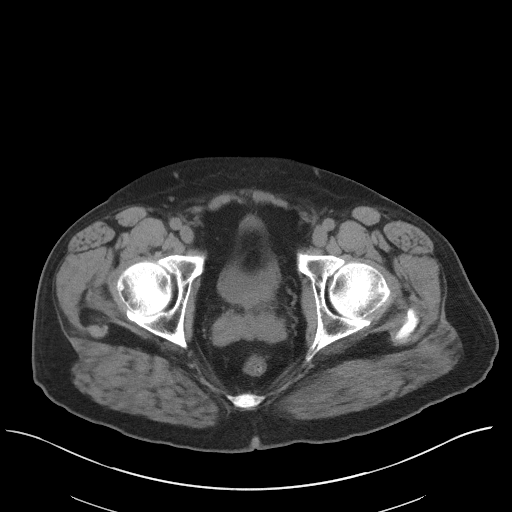
[im 27/102  soft-tissue]
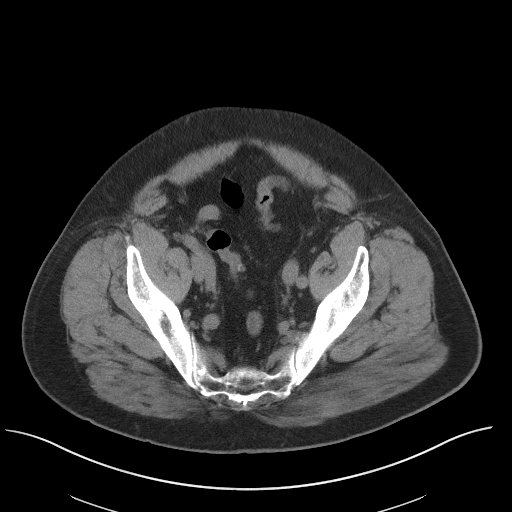
[im 36/102  soft-tissue]
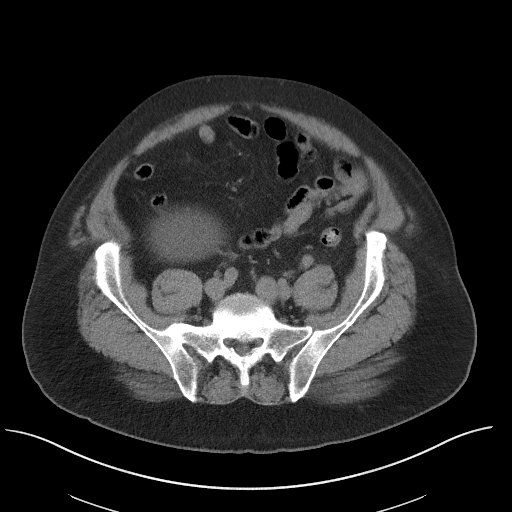
[im 40/102  soft-tissue]
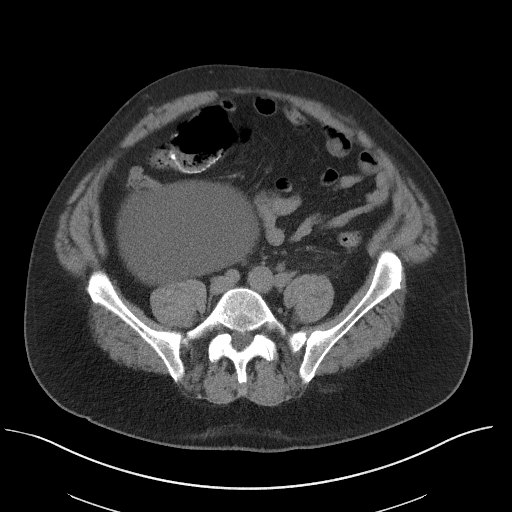
[im 49/102  soft-tissue]
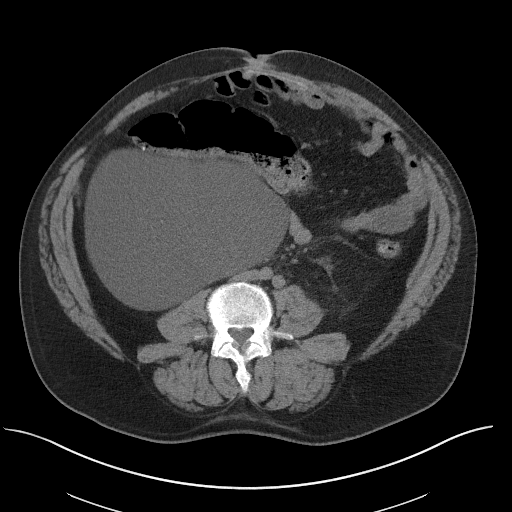
[im 53/102  soft-tissue]
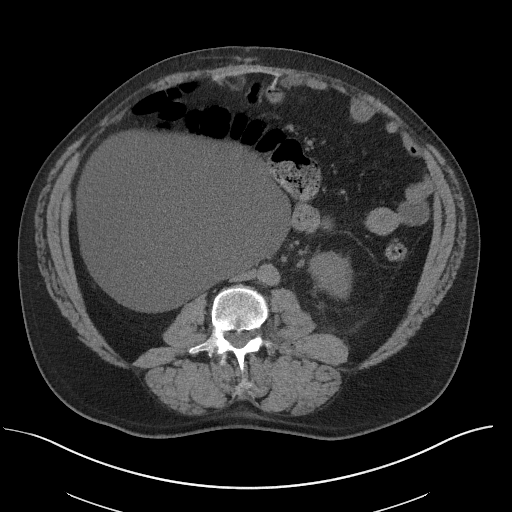
[im 62/102  soft-tissue]
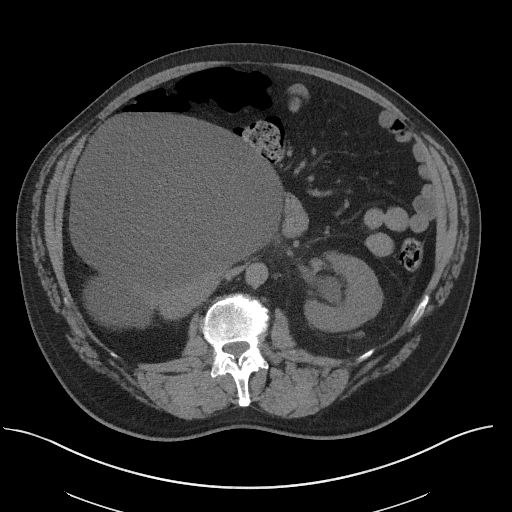
[im 62/102  bone]
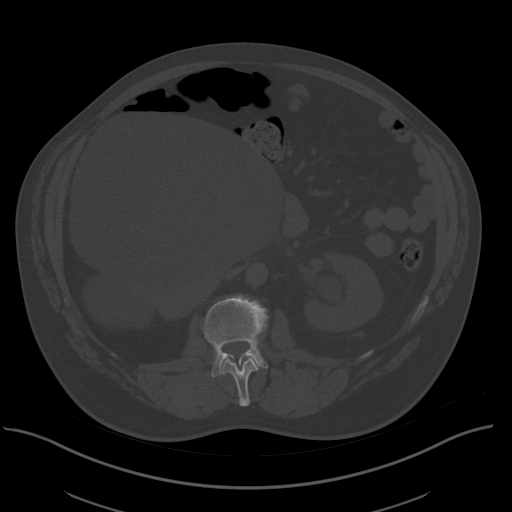
[im 66/102  soft-tissue]
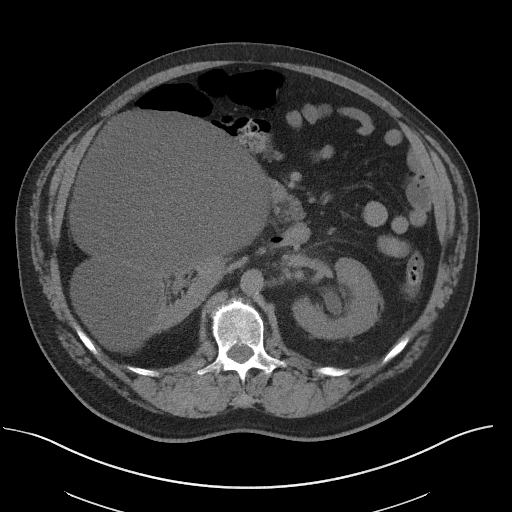
[im 75/102  soft-tissue]
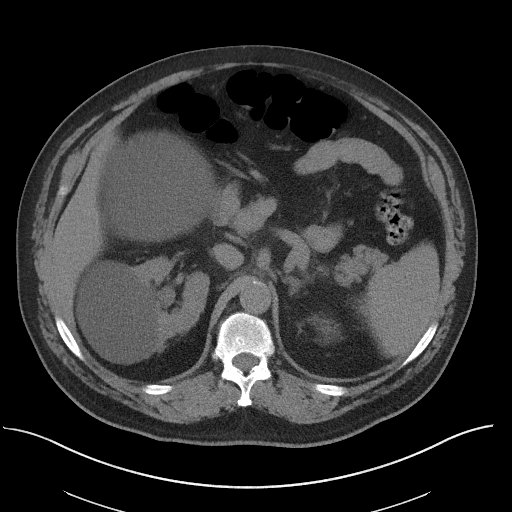
[im 84/102  soft-tissue]
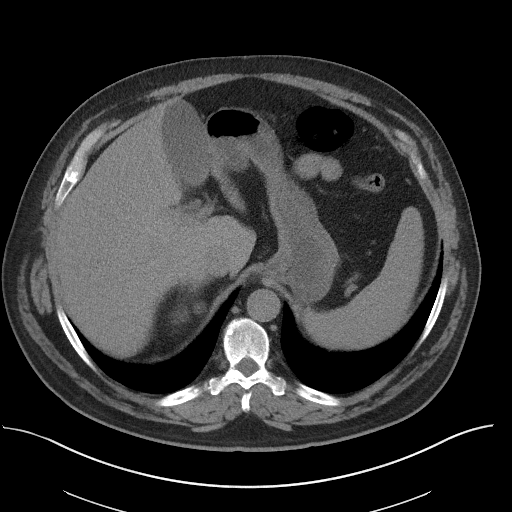
[im 88/102  soft-tissue]
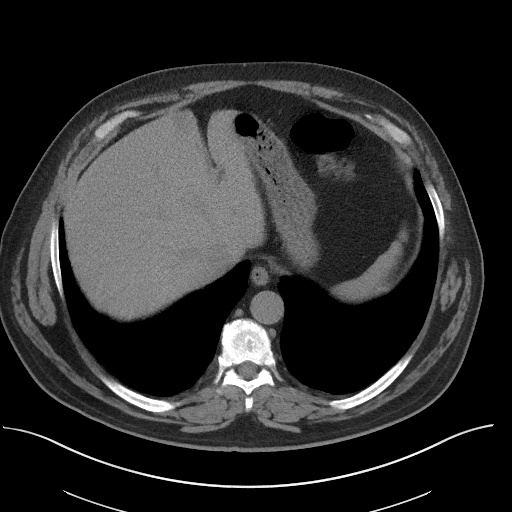
[im 97/102  soft-tissue]
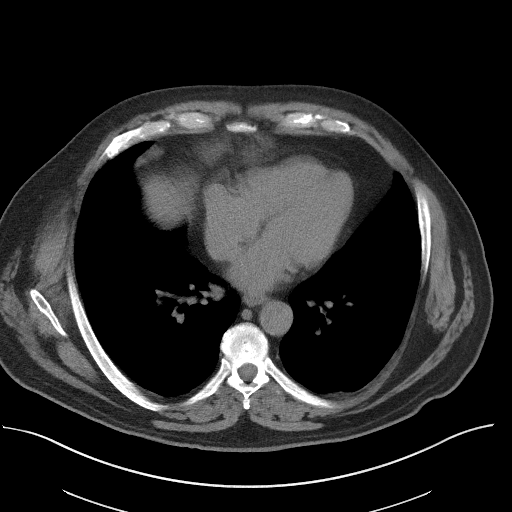

[Series 5: coronal · coronal · 0.86mm/px · 3 of 171 slices shown]
[im 57/171  soft-tissue]
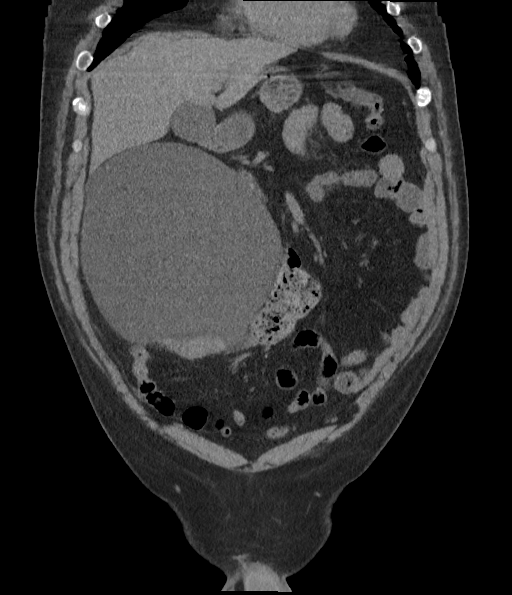
[im 76/171  soft-tissue]
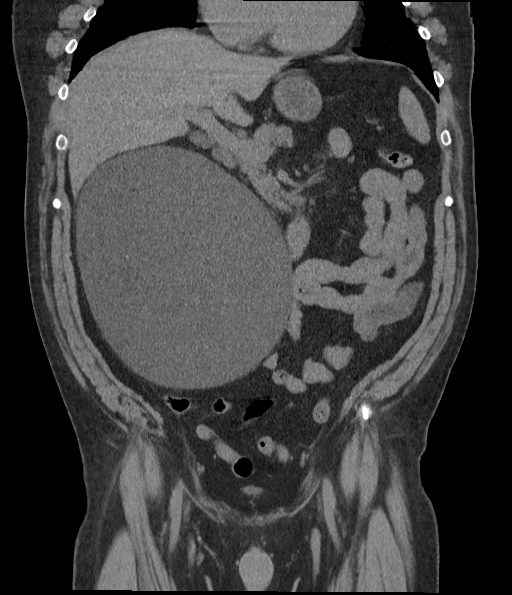
[im 95/171  soft-tissue]
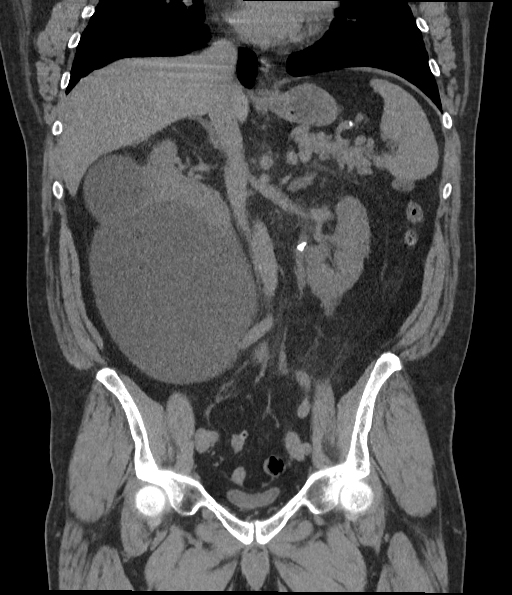

[17 of 46 positions shown; findings below may reference images not displayed]

FINDINGS: Lower chest:  No contributory findings.

Hepatobiliary: No focal liver abnormality.No evidence of biliary
obstruction or stone.

Pancreas: Unremarkable.

Spleen: Unremarkable.

Adrenals/Urinary Tract: Negative adrenals. Irregularly-shaped stone
at the left UPJ measuring 8 x 4 mm, with mild hydronephrosis and
perinephric stranding.Large right renal cysts measuring up to 21 cm
at the right lower kidney where there is renal parenchymal
distortion.

Stomach/Bowel: No obstruction. Appendectomy. Mild left colonic
diverticulosis.

Vascular/Lymphatic: No acute vascular abnormality. No mass or
adenopathy.

Reproductive:Mild symmetric prostate enlargement.

Other: No ascites or pneumoperitoneum. Umbilical hernia repair using
mesh

Musculoskeletal: No acute abnormalities.
IMPRESSION: 1. 8 x 4 mm left UPJ calculus causing mild hydronephrosis.
2. Right renal cysts measuring up to 21 cm.

## 2021-05-16 ENCOUNTER — Other Ambulatory Visit: Payer: BC Managed Care – PPO

## 2021-05-16 DIAGNOSIS — I10 Essential (primary) hypertension: Secondary | ICD-10-CM

## 2021-05-16 DIAGNOSIS — N138 Other obstructive and reflux uropathy: Secondary | ICD-10-CM | POA: Diagnosis not present

## 2021-05-16 DIAGNOSIS — E782 Mixed hyperlipidemia: Secondary | ICD-10-CM | POA: Diagnosis not present

## 2021-05-16 DIAGNOSIS — Z1329 Encounter for screening for other suspected endocrine disorder: Secondary | ICD-10-CM

## 2021-05-16 DIAGNOSIS — N401 Enlarged prostate with lower urinary tract symptoms: Secondary | ICD-10-CM | POA: Diagnosis not present

## 2021-05-16 DIAGNOSIS — Z131 Encounter for screening for diabetes mellitus: Secondary | ICD-10-CM

## 2021-05-16 LAB — BAYER DCA HB A1C WAIVED: HB A1C (BAYER DCA - WAIVED): 5.2 % (ref 4.8–5.6)

## 2021-05-17 LAB — CBC WITH DIFFERENTIAL/PLATELET
Basophils Absolute: 0 10*3/uL (ref 0.0–0.2)
Basos: 0 %
EOS (ABSOLUTE): 0.1 10*3/uL (ref 0.0–0.4)
Eos: 2 %
Hematocrit: 48.6 % (ref 37.5–51.0)
Hemoglobin: 16 g/dL (ref 13.0–17.7)
Immature Grans (Abs): 0.1 10*3/uL (ref 0.0–0.1)
Immature Granulocytes: 1 %
Lymphocytes Absolute: 1.8 10*3/uL (ref 0.7–3.1)
Lymphs: 24 %
MCH: 29.5 pg (ref 26.6–33.0)
MCHC: 32.9 g/dL (ref 31.5–35.7)
MCV: 90 fL (ref 79–97)
Monocytes Absolute: 0.5 10*3/uL (ref 0.1–0.9)
Monocytes: 7 %
Neutrophils Absolute: 4.8 10*3/uL (ref 1.4–7.0)
Neutrophils: 66 %
Platelets: 170 10*3/uL (ref 150–450)
RBC: 5.43 x10E6/uL (ref 4.14–5.80)
RDW: 13.3 % (ref 11.6–15.4)
WBC: 7.2 10*3/uL (ref 3.4–10.8)

## 2021-05-17 LAB — LIPID PANEL
Chol/HDL Ratio: 2.8 ratio (ref 0.0–5.0)
Cholesterol, Total: 120 mg/dL (ref 100–199)
HDL: 43 mg/dL (ref >39)
LDL Chol Calc (NIH): 62 mg/dL (ref 0–99)
Triglycerides: 73 mg/dL (ref 0–149)
VLDL Cholesterol Cal: 15 mg/dL (ref 5–40)

## 2021-05-17 LAB — COMPREHENSIVE METABOLIC PANEL
ALT: 98 IU/L — ABNORMAL HIGH (ref 0–44)
AST: 39 IU/L (ref 0–40)
Albumin/Globulin Ratio: 2 (ref 1.2–2.2)
Albumin: 4.2 g/dL (ref 3.7–4.7)
Alkaline Phosphatase: 62 IU/L (ref 44–121)
BUN/Creatinine Ratio: 15 (ref 10–24)
BUN: 17 mg/dL (ref 8–27)
Bilirubin Total: 0.4 mg/dL (ref 0.0–1.2)
CO2: 24 mmol/L (ref 20–29)
Calcium: 9 mg/dL (ref 8.6–10.2)
Chloride: 103 mmol/L (ref 96–106)
Creatinine, Ser: 1.11 mg/dL (ref 0.76–1.27)
Globulin, Total: 2.1 g/dL (ref 1.5–4.5)
Glucose: 83 mg/dL (ref 70–99)
Potassium: 4.4 mmol/L (ref 3.5–5.2)
Sodium: 141 mmol/L (ref 134–144)
Total Protein: 6.3 g/dL (ref 6.0–8.5)
eGFR: 70 mL/min/{1.73_m2} (ref >59)

## 2021-05-17 LAB — THYROID PANEL WITH TSH
Free Thyroxine Index: 2.6 (ref 1.2–4.9)
T3 Uptake Ratio: 32 % (ref 24–39)
T4, Total: 8.2 ug/dL (ref 4.5–12.0)
TSH: 2.06 u[IU]/mL (ref 0.450–4.500)

## 2021-05-17 LAB — PSA: Prostate Specific Ag, Serum: 3.2 ng/mL (ref 0.0–4.0)

## 2021-05-25 ENCOUNTER — Encounter: Payer: Self-pay | Admitting: Internal Medicine

## 2021-05-25 ENCOUNTER — Ambulatory Visit (INDEPENDENT_AMBULATORY_CARE_PROVIDER_SITE_OTHER): Payer: BC Managed Care – PPO | Admitting: Internal Medicine

## 2021-05-25 VITALS — BP 111/77 | HR 64 | Temp 97.9°F | Ht 70.0 in | Wt 231.0 lb

## 2021-05-25 DIAGNOSIS — I10 Essential (primary) hypertension: Secondary | ICD-10-CM

## 2021-05-25 DIAGNOSIS — B351 Tinea unguium: Secondary | ICD-10-CM | POA: Diagnosis not present

## 2021-05-25 DIAGNOSIS — Z0000 Encounter for general adult medical examination without abnormal findings: Secondary | ICD-10-CM | POA: Diagnosis not present

## 2021-05-25 MED ORDER — ROSUVASTATIN CALCIUM 20 MG PO TABS
20.0000 mg | ORAL_TABLET | Freq: Every day | ORAL | 3 refills | Status: DC
Start: 2021-05-25 — End: 2022-06-06

## 2021-05-25 MED ORDER — BENAZEPRIL HCL 10 MG PO TABS: 10.0000 mg | ORAL_TABLET | Freq: Every day | ORAL | 4 refills | Status: DC

## 2021-05-25 NOTE — Patient Instructions (Signed)
?    Latest Ref Rng & Units 05/16/2021  ?  8:31 AM 11/15/2020  ?  8:10 AM 08/30/2020  ?  8:59 AM  ?CMP  ?Glucose 70 - 99 mg/dL 83   85   69    ?BUN 8 - 27 mg/dL '17   17   16    '$ ?Creatinine 0.76 - 1.27 mg/dL 1.11   1.02   1.36    ?Sodium 134 - 144 mmol/L 141   142   142    ?Potassium 3.5 - 5.2 mmol/L 4.4   4.7   4.2    ?Chloride 96 - 106 mmol/L 103   104   102    ?CO2 20 - 29 mmol/L '24   23   22    '$ ?Calcium 8.6 - 10.2 mg/dL 9.0   9.1   9.3    ?Total Protein 6.0 - 8.5 g/dL 6.3   6.3     ?Total Bilirubin 0.0 - 1.2 mg/dL 0.4   0.6     ?Alkaline Phos 44 - 121 IU/L 62   59     ?AST 0 - 40 IU/L 39   21     ?ALT 0 - 44 IU/L 98   33     ?  ?

## 2021-05-25 NOTE — Progress Notes (Signed)
? ?BP 111/77   Pulse 64   Temp 97.9 ?F (36.6 ?C) (Oral)   Ht '5\' 10"'  (1.778 m)   Wt 231 lb (104.8 kg)   SpO2 97%   BMI 33.15 kg/m?   ? ?Subjective:  ? ? Patient ID: Logan Willis, male    DOB: Jul 19, 1945, 76 y.o.   MRN: [id number] ? ?Chief Complaint  ?Patient presents with  ? Annual Exam  ? ? ?HPI: ?Logan Willis is a 76 y.o. male ? ?Onychomycosis - is on lamisil l;fts higher ALT specifically sinc ehe has started takng this.  ? ? ?Chief Complaint  ?Patient presents with  ? Annual Exam  ? ? ?Relevant past medical, surgical, family and social history reviewed and updated as indicated. Interim medical history since our last visit reviewed. ?Allergies and medications reviewed and updated. ? ?Review of Systems  ?Constitutional:  Negative for activity change, appetite change, chills, fatigue and fever.  ?HENT:  Negative for congestion, ear discharge, ear pain and facial swelling.   ?Eyes:  Negative for pain, discharge and itching.  ?Respiratory:  Negative for cough, chest tightness, shortness of breath and wheezing.   ?Cardiovascular:  Negative for chest pain, palpitations and leg swelling.  ?Gastrointestinal:  Negative for abdominal distention, abdominal pain, blood in stool, constipation, diarrhea, nausea and vomiting.  ?Endocrine: Negative for cold intolerance, heat intolerance, polydipsia, polyphagia and polyuria.  ?Genitourinary:  Negative for difficulty urinating, dysuria, flank pain, frequency, hematuria and urgency.  ?Musculoskeletal:  Negative for arthralgias, gait problem, joint swelling and myalgias.  ?Skin:  Negative for color change, rash and wound.  ?Neurological:  Negative for dizziness, tremors, speech difficulty, weakness, light-headedness, numbness and headaches.  ?Hematological:  Does not bruise/bleed easily.  ?Psychiatric/Behavioral:  Negative for agitation, confusion, decreased concentration, sleep disturbance and suicidal ideas.   ? ?Per HPI unless specifically indicated above ? ?    ?Objective:  ?  ?BP 111/77   Pulse 64   Temp 97.9 ?F (36.6 ?C) (Oral)   Ht '5\' 10"'  (1.778 m)   Wt 231 lb (104.8 kg)   SpO2 97%   BMI 33.15 kg/m?   ?Wt Readings from Last 3 Encounters:  ?05/25/21 231 lb (104.8 kg)  ?05/10/21 239 lb (108.4 kg)  ?05/04/21 235 lb (106.6 kg)  ?  ?Physical Exam ?Vitals and nursing note reviewed.  ?Constitutional:   ?   General: He is not in acute distress. ?   Appearance: Normal appearance. He is not ill-appearing or diaphoretic.  ?HENT:  ?   Head: Normocephalic and atraumatic.  ?   Right Ear: Tympanic membrane and external ear normal. There is no impacted cerumen.  ?   Left Ear: External ear normal.  ?   Nose: No congestion or rhinorrhea.  ?   Mouth/Throat:  ?   Pharynx: No oropharyngeal exudate or posterior oropharyngeal erythema.  ?Eyes:  ?   Conjunctiva/sclera: Conjunctivae normal.  ?   Pupils: Pupils are equal, round, and reactive to light.  ?Cardiovascular:  ?   Rate and Rhythm: Normal rate and regular rhythm.  ?   Heart sounds: No murmur heard. ?  No friction rub. No gallop.  ?Pulmonary:  ?   Effort: No respiratory distress.  ?   Breath sounds: No stridor. No wheezing or rhonchi.  ?Chest:  ?   Chest wall: No tenderness.  ?Abdominal:  ?   General: Abdomen is flat. Bowel sounds are normal.  ?   Palpations: Abdomen is soft. There is no mass.  ?  Tenderness: There is no abdominal tenderness.  ?Musculoskeletal:  ?   Cervical back: Normal range of motion and neck supple. No rigidity or tenderness.  ?   Left lower leg: No edema.  ?Skin: ?   General: Skin is warm and dry.  ?   Coloration: Skin is not jaundiced or pale.  ?   Findings: No bruising or erythema.  ?Neurological:  ?   Mental Status: He is alert.  ?   Cranial Nerves: No cranial nerve deficit.  ?   Sensory: No sensory deficit.  ?   Motor: No weakness.  ?   Coordination: Coordination normal.  ?   Gait: Gait normal.  ?   Deep Tendon Reflexes: Reflexes normal.  ?Psychiatric:     ?   Mood and Affect: Mood normal.     ?    Behavior: Behavior normal.     ?   Thought Content: Thought content normal.  ? ? ?Results for orders placed or performed in visit on 05/16/21  ?PSA  ?Result Value Ref Range  ? Prostate Specific Ag, Serum 3.2 0.0 - 4.0 ng/mL  ?Lipid panel  ?Result Value Ref Range  ? Cholesterol, Total 120 100 - 199 mg/dL  ? Triglycerides 73 0 - 149 mg/dL  ? HDL 43 >39 mg/dL  ? VLDL Cholesterol Cal 15 5 - 40 mg/dL  ? LDL Chol Calc (NIH) 62 0 - 99 mg/dL  ? Chol/HDL Ratio 2.8 0.0 - 5.0 ratio  ?Bayer DCA Hb A1c Waived  ?Result Value Ref Range  ? HB A1C (BAYER DCA - WAIVED) 5.2 4.8 - 5.6 %  ?Thyroid Panel With TSH  ?Result Value Ref Range  ? TSH 2.060 0.450 - 4.500 uIU/mL  ? T4, Total 8.2 4.5 - 12.0 ug/dL  ? T3 Uptake Ratio 32 24 - 39 %  ? Free Thyroxine Index 2.6 1.2 - 4.9  ?CBC with Differential/Platelet  ?Result Value Ref Range  ? WBC 7.2 3.4 - 10.8 x10E3/uL  ? RBC 5.43 4.14 - 5.80 x10E6/uL  ? Hemoglobin 16.0 13.0 - 17.7 g/dL  ? Hematocrit 48.6 37.5 - 51.0 %  ? MCV 90 79 - 97 fL  ? MCH 29.5 26.6 - 33.0 pg  ? MCHC 32.9 31.5 - 35.7 g/dL  ? RDW 13.3 11.6 - 15.4 %  ? Platelets 170 150 - 450 x10E3/uL  ? Neutrophils 66 Not Estab. %  ? Lymphs 24 Not Estab. %  ? Monocytes 7 Not Estab. %  ? Eos 2 Not Estab. %  ? Basos 0 Not Estab. %  ? Neutrophils Absolute 4.8 1.4 - 7.0 x10E3/uL  ? Lymphocytes Absolute 1.8 0.7 - 3.1 x10E3/uL  ? Monocytes Absolute 0.5 0.1 - 0.9 x10E3/uL  ? EOS (ABSOLUTE) 0.1 0.0 - 0.4 x10E3/uL  ? Basophils Absolute 0.0 0.0 - 0.2 x10E3/uL  ? Immature Granulocytes 1 Not Estab. %  ? Immature Grans (Abs) 0.1 0.0 - 0.1 x10E3/uL  ?Comprehensive metabolic panel  ?Result Value Ref Range  ? Glucose 83 70 - 99 mg/dL  ? BUN 17 8 - 27 mg/dL  ? Creatinine, Ser 1.11 0.76 - 1.27 mg/dL  ? eGFR 70 >59 mL/min/1.73  ? BUN/Creatinine Ratio 15 10 - 24  ? Sodium 141 134 - 144 mmol/L  ? Potassium 4.4 3.5 - 5.2 mmol/L  ? Chloride 103 96 - 106 mmol/L  ? CO2 24 20 - 29 mmol/L  ? Calcium 9.0 8.6 - 10.2 mg/dL  ? Total Protein 6.3 6.0 - 8.5 g/dL  ?  Albumin  4.2 3.7 - 4.7 g/dL  ? Globulin, Total 2.1 1.5 - 4.5 g/dL  ? Albumin/Globulin Ratio 2.0 1.2 - 2.2  ? Bilirubin Total 0.4 0.0 - 1.2 mg/dL  ? Alkaline Phosphatase 62 44 - 121 IU/L  ? AST 39 0 - 40 IU/L  ? ALT 98 (H) 0 - 44 IU/L  ? ?   ? ? ?Current Outpatient Medications:  ?  fexofenadine-pseudoephedrine (ALLEGRA-D 24) 180-240 MG 24 hr tablet, Take 1 tablet by mouth daily as needed., Disp: , Rfl:  ?  ketoconazole (NIZORAL) 2 % cream, Apply to feet QHS, Disp: 60 g, Rfl: 6 ?  tamsulosin (FLOMAX) 0.4 MG CAPS capsule, Take 1 capsule (0.4 mg total) by mouth daily., Disp: 90 capsule, Rfl: 4 ?  benazepril (LOTENSIN) 10 MG tablet, Take 1 tablet (10 mg total) by mouth daily., Disp: 90 tablet, Rfl: 4 ?  rosuvastatin (CRESTOR) 20 MG tablet, Take 1 tablet (20 mg total) by mouth daily., Disp: 90 tablet, Rfl: 3 ?  triamcinolone cream (KENALOG) 0.1 %, Apply 1 application. topically 2 (two) times daily. (Patient not taking: Reported on 05/10/2021), Disp: , Rfl:  ?  valACYclovir (VALTREX) 500 MG tablet, Take 2 tablets (1,000 mg total) by mouth 2 (two) times daily. (Patient not taking: Reported on 05/10/2021), Disp: 28 tablet, Rfl: 12  ? ? ?Assessment & Plan:  ?Onychomycosis on terbanefine ALT high  ?? Sec to such  ?To d/w derm and monitor.  ? ?PHYSICAL :  Physical Wnl ?will check CMP, FLP, CBC,TSH, PSA. ? ?Problem List Items Addressed This Visit   ? ?  ? Cardiovascular and Mediastinum  ? Essential hypertension  ? Relevant Medications  ? benazepril (LOTENSIN) 10 MG tablet  ? rosuvastatin (CRESTOR) 20 MG tablet  ?  ? Musculoskeletal and Integument  ? Onychomycosis - Primary  ?  ? Other  ? Annual physical exam  ?  ? ?No orders of the defined types were placed in this encounter. ?  ? ?Meds ordered this encounter  ?Medications  ? benazepril (LOTENSIN) 10 MG tablet  ?  Sig: Take 1 tablet (10 mg total) by mouth daily.  ?  Dispense:  90 tablet  ?  Refill:  4  ? rosuvastatin (CRESTOR) 20 MG tablet  ?  Sig: Take 1 tablet (20 mg total) by mouth daily.   ?  Dispense:  90 tablet  ?  Refill:  3  ?  ? ?Follow up plan: ?Return in about 6 months (around 11/25/2021). ? ? ? ?

## 2021-05-26 ENCOUNTER — Telehealth: Payer: Self-pay

## 2021-05-26 NOTE — Telephone Encounter (Signed)
Pt called he is taking Terbinafine for toenail fungus, he had labs 05/17/21 his doctor instructed pt get in touch with Dk to discuss elavated ALT

## 2021-05-31 ENCOUNTER — Telehealth: Payer: Self-pay

## 2021-05-31 NOTE — Telephone Encounter (Signed)
Called pt discussed  he may stop Terbinafine tablets.  His lab is elevated, but only one liver test is elevated.  It may be from the Terbinafine pill (or maybe not).  Would stop and see how things are in 6 mos at fu appt.  No need for further lab at this time since stopping medication.  May consider checking lab at next visit if indicated.

## 2021-08-26 ENCOUNTER — Encounter: Payer: Self-pay | Admitting: Internal Medicine

## 2021-10-03 ENCOUNTER — Ambulatory Visit (INDEPENDENT_AMBULATORY_CARE_PROVIDER_SITE_OTHER): Payer: BC Managed Care – PPO

## 2021-10-03 DIAGNOSIS — Z23 Encounter for immunization: Secondary | ICD-10-CM

## 2021-10-03 NOTE — Addendum Note (Signed)
Addended by: Donzetta Kohut A on: 10/03/2021 09:36 AM   Modules accepted: Orders

## 2021-10-03 NOTE — Progress Notes (Signed)
Patient presents today for High Dose Flu vaccination, patient received in left  deltoid, patient tolerated well.

## 2021-10-13 DIAGNOSIS — M3501 Sicca syndrome with keratoconjunctivitis: Secondary | ICD-10-CM | POA: Diagnosis not present

## 2021-10-13 DIAGNOSIS — Z0100 Encounter for examination of eyes and vision without abnormal findings: Secondary | ICD-10-CM | POA: Diagnosis not present

## 2021-11-07 ENCOUNTER — Ambulatory Visit (INDEPENDENT_AMBULATORY_CARE_PROVIDER_SITE_OTHER): Payer: BC Managed Care – PPO | Admitting: Dermatology

## 2021-11-07 DIAGNOSIS — B351 Tinea unguium: Secondary | ICD-10-CM

## 2021-11-07 DIAGNOSIS — L821 Other seborrheic keratosis: Secondary | ICD-10-CM

## 2021-11-07 DIAGNOSIS — D229 Melanocytic nevi, unspecified: Secondary | ICD-10-CM

## 2021-11-07 DIAGNOSIS — Z1283 Encounter for screening for malignant neoplasm of skin: Secondary | ICD-10-CM | POA: Diagnosis not present

## 2021-11-07 DIAGNOSIS — B353 Tinea pedis: Secondary | ICD-10-CM

## 2021-11-07 DIAGNOSIS — L578 Other skin changes due to chronic exposure to nonionizing radiation: Secondary | ICD-10-CM

## 2021-11-07 DIAGNOSIS — L814 Other melanin hyperpigmentation: Secondary | ICD-10-CM

## 2021-11-07 DIAGNOSIS — D485 Neoplasm of uncertain behavior of skin: Secondary | ICD-10-CM

## 2021-11-07 DIAGNOSIS — D171 Benign lipomatous neoplasm of skin and subcutaneous tissue of trunk: Secondary | ICD-10-CM

## 2021-11-07 MED ORDER — KETOCONAZOLE 2 % EX CREA: TOPICAL_CREAM | CUTANEOUS | 6 refills | Status: DC

## 2021-11-07 NOTE — Patient Instructions (Addendum)
Wound Care Instructions  Cleanse wound gently with soap and water once a day then pat dry with clean gauze. Apply a thin coat of Petrolatum (petroleum jelly, "Vaseline") over the wound (unless you have an allergy to this). We recommend that you use a new, sterile tube of Vaseline. Do not pick or remove scabs. Do not remove the yellow or white "healing tissue" from the base of the wound.  Cover the wound with fresh, clean, nonstick gauze and secure with paper tape. You may use Band-Aids in place of gauze and tape if the wound is small enough, but would recommend trimming much of the tape off as there is often too much. Sometimes Band-Aids can irritate the skin.  You should call the office for your biopsy report after 1 week if you have not already been contacted.  If you experience any problems, such as abnormal amounts of bleeding, swelling, significant bruising, significant pain, or evidence of infection, please call the office immediately.  FOR ADULT SURGERY PATIENTS: If you need something for pain relief you may take 1 extra strength Tylenol (acetaminophen) AND 2 Ibuprofen (200mg each) together every 4 hours as needed for pain. (do not take these if you are allergic to them or if you have a reason you should not take them.) Typically, you may only need pain medication for 1 to 3 days.     Due to recent changes in healthcare laws, you may see results of your pathology and/or laboratory studies on MyChart before the doctors have had a chance to review them. We understand that in some cases there may be results that are confusing or concerning to you. Please understand that not all results are received at the same time and often the doctors may need to interpret multiple results in order to provide you with the best plan of care or course of treatment. Therefore, we ask that you please give us 2 business days to thoroughly review all your results before contacting the office for clarification. Should  we see a critical lab result, you will be contacted sooner.   If You Need Anything After Your Visit  If you have any questions or concerns for your doctor, please call our main line at [phone] and press option 4 to reach your doctor's medical assistant. If no one answers, please leave a voicemail as directed and we will return your call as soon as possible. Messages left after 4 pm will be answered the following business day.   You may also send us a message via MyChart. We typically respond to MyChart messages within 1-2 business days.  For prescription refills, please ask your pharmacy to contact our office. Our fax number is [phone].  If you have an urgent issue when the clinic is closed that cannot wait until the next business day, you can page your doctor at the number below.    Please note that while we do our best to be available for urgent issues outside of office hours, we are not available 24/7.   If you have an urgent issue and are unable to reach us, you may choose to seek medical care at your doctor's office, retail clinic, urgent care center, or emergency room.  If you have a medical emergency, please immediately call 911 or go to the emergency department.  Pager Numbers  - Dr. Kowalski: [phone]  - Dr. Moye: [phone]  - Dr. Stewart: [phone]  In the event of inclement weather, please call our main line at   [phone] for an update on the status of any delays or closures.  Dermatology Medication Tips: Please keep the boxes that topical medications come in in order to help keep track of the instructions about where and how to use these. Pharmacies typically print the medication instructions only on the boxes and not directly on the medication tubes.   If your medication is too expensive, please contact our office at [phone] option 4 or send us a message through MyChart.   We are unable to tell what your co-pay for medications will be in  advance as this is different depending on your insurance coverage. However, we may be able to find a substitute medication at lower cost or fill out paperwork to get insurance to cover a needed medication.   If a prior authorization is required to get your medication covered by your insurance company, please allow us 1-2 business days to complete this process.  Drug prices often vary depending on where the prescription is filled and some pharmacies may offer cheaper prices.  The website www.goodrx.com contains coupons for medications through different pharmacies. The prices here do not account for what the cost may be with help from insurance (it may be cheaper with your insurance), but the website can give you the price if you did not use any insurance.  - You can print the associated coupon and take it with your prescription to the pharmacy.  - You may also stop by our office during regular business hours and pick up a GoodRx coupon card.  - If you need your prescription sent electronically to a different pharmacy, notify our office through [facility] MyChart or by phone at [phone] option 4.     Si Usted Necesita Algo Despus de Su Visita  Tambin puede enviarnos un mensaje a travs de MyChart. Por lo general respondemos a los mensajes de MyChart en el transcurso de 1 a 2 das hbiles.  Para renovar recetas, por favor pida a su farmacia que se ponga en contacto con nuestra oficina. Nuestro nmero de fax es el [phone].  Si tiene un asunto urgente cuando la clnica est cerrada y que no puede esperar hasta el siguiente da hbil, puede llamar/localizar a su doctor(a) al nmero que aparece a continuacin.   Por favor, tenga en cuenta que aunque hacemos todo lo posible para estar disponibles para asuntos urgentes fuera del horario de oficina, no estamos disponibles las 24 horas del da, los 7 das de la semana.   Si tiene un problema urgente y no puede comunicarse con nosotros, puede  optar por buscar atencin mdica  en el consultorio de su doctor(a), en una clnica privada, en un centro de atencin urgente o en una sala de emergencias.  Si tiene una emergencia mdica, por favor llame inmediatamente al 911 o vaya a la sala de emergencias.  Nmeros de bper  - Dr. Kowalski: [phone]  - Dra. Moye: [phone]  - Dra. Stewart: [phone]  En caso de inclemencias del tiempo, por favor llame a nuestra lnea principal al [phone] para una actualizacin sobre el estado de cualquier retraso o cierre.  Consejos para la medicacin en dermatologa: Por favor, guarde las cajas en las que vienen los medicamentos de uso tpico para ayudarle a seguir las instrucciones sobre dnde y cmo usarlos. Las farmacias generalmente imprimen las instrucciones del medicamento slo en las cajas y no directamente en los tubos del medicamento.   Si su medicamento es muy caro, por favor, pngase en contacto con   nuestra oficina llamando al [phone] y presione la opcin 4 o envenos un mensaje a travs de MyChart.   No podemos decirle cul ser su copago por los medicamentos por adelantado ya que esto es diferente dependiendo de la cobertura de su seguro. Sin embargo, es posible que podamos encontrar un medicamento sustituto a menor costo o llenar un formulario para que el seguro cubra el medicamento que se considera necesario.   Si se requiere una autorizacin previa para que su compaa de seguros cubra su medicamento, por favor permtanos de 1 a 2 das hbiles para completar este proceso.  Los precios de los medicamentos varan con frecuencia dependiendo del lugar de dnde se surte la receta y alguna farmacias pueden ofrecer precios ms baratos.  El sitio web www.goodrx.com tiene cupones para medicamentos de diferentes farmacias. Los precios aqu no tienen en cuenta lo que podra costar con la ayuda del seguro (puede ser ms barato con su seguro), pero el sitio web puede darle el  precio si no utiliz ningn seguro.  - Puede imprimir el cupn correspondiente y llevarlo con su receta a la farmacia.  - Tambin puede pasar por nuestra oficina durante el horario de atencin regular y recoger una tarjeta de cupones de GoodRx.  - Si necesita que su receta se enve electrnicamente a una farmacia diferente, informe a nuestra oficina a travs de MyChart de [facility] o por telfono llamando al [phone] y presione la opcin 4.  

## 2021-11-07 NOTE — Progress Notes (Unsigned)
Follow-Up Visit   Subjective  Logan Willis is a 76 y.o. male who presents for the following: Tinea pedis (S/P Lamisil x 1 mth. Labs drawn showed elevated ALT and patient was advised to stop medication. He has noticed some improvement in condition). The patient presents for Upper Body Skin Exam (UBSE) for skin cancer screening and mole check.  The patient has spots, moles and lesions to be evaluated, some may be new or changing and the patient has concerns that these could be cancer.  The following portions of the chart were reviewed this encounter and updated as appropriate:   Tobacco  Allergies  Meds  Problems  Med Hx  Surg Hx  Fam Hx     Review of Systems:  No other skin or systemic complaints except as noted in HPI or Assessment and Plan.  Objective  Well appearing patient in no apparent distress; mood and affect are within normal limits.  A focused examination was performed including the face, scalp, and extremities. Relevant physical exam findings are noted in the Assessment and Plan.  B/L foot Scale of the feet.  L inf buttock 0.6 cm flesh colored papule.   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 Condition is symptomatic / bothersome to patient. Not to goal. Reviewed labs from 11/22 and 05/23.   Continue Ketoconazole 2% cream QHS.   Patient defers oral Fluconazole today and prefers to use topical treatment instead.  Neoplasm of uncertain behavior of skin L inf buttock Epidermal / dermal shaving  Lesion diameter (cm):  0.6 Informed consent: discussed and consent obtained   Timeout: patient name, date of birth, surgical site, and procedure verified   Procedure prep:  Patient was prepped and draped in usual sterile fashion Prep type:  Isopropyl alcohol Anesthesia: the lesion was anesthetized in a standard fashion   Anesthetic:  1% lidocaine w/  epinephrine 1-100,000 buffered w/ 8.4% NaHCO3 Instrument used: flexible razor blade   Hemostasis achieved with: pressure, aluminum chloride and electrodesiccation   Outcome: patient tolerated procedure well   Post-procedure details: sterile dressing applied and wound care instructions given   Dressing type: bandage and petrolatum    Specimen 1 - Surgical pathology Differential Diagnosis: D48.5 irritated nevus r/o dysplasia Check Margins: No  Lentigines - Scattered tan macules - Due to sun exposure - Benign-appearing, observe - Recommend daily broad spectrum sunscreen SPF 30+ to sun-exposed areas, reapply every 2 hours as needed. - Call for any changes  Seborrheic Keratoses - Stuck-on, waxy, tan-brown papules and/or plaques  - Benign-appearing - Discussed benign etiology and prognosis. - Observe - Call for any changes  Melanocytic Nevi - Tan-brown and/or pink-flesh-colored symmetric macules and papules - Benign appearing on exam today - Observation - Call clinic for new or changing moles - Recommend daily use of broad spectrum spf 30+ sunscreen to sun-exposed areas.   Hemangiomas - Red papules - Discussed benign nature - Observe - Call for any changes  Actinic Damage - Chronic condition, secondary to cumulative UV/sun exposure - diffuse scaly erythematous macules with underlying dyspigmentation - Recommend daily broad spectrum sunscreen SPF 30+ to sun-exposed areas, reapply every 2 hours as needed.  - Staying in the shade or wearing long sleeves, sun glasses (UVA+UVB protection) and wide brim hats (4-inch brim around the entire circumference of the hat) are also recommended for sun protection.  - Call for new or  changing lesions.  Skin cancer screening performed today.  Return in about 1 year (around 11/08/2022) for TBSE.  Luther Redo, CMA, am acting as scribe for Sarina Ser, MD . Documentation: I have reviewed the above documentation for accuracy and  completeness, and I agree with the above.  Sarina Ser, MD

## 2021-11-08 ENCOUNTER — Encounter: Payer: Self-pay | Admitting: Dermatology

## 2021-11-15 ENCOUNTER — Telehealth: Payer: Self-pay

## 2021-11-15 NOTE — Telephone Encounter (Signed)
-----   Message from Ralene Bathe, MD sent at 11/15/2021 12:57 PM EST ----- Diagnosis Skin , left inf buttock NEVUS LIPOMATOSUS SUPERFICIALIS   Benign "fatty" mole No further treatment needed at this time.

## 2021-11-15 NOTE — Telephone Encounter (Signed)
Advised pt of bx result/sh ?

## 2021-11-29 ENCOUNTER — Ambulatory Visit: Payer: BC Managed Care – PPO | Admitting: Unknown Physician Specialty

## 2021-12-06 ENCOUNTER — Encounter: Payer: Self-pay | Admitting: Unknown Physician Specialty

## 2021-12-06 ENCOUNTER — Ambulatory Visit (INDEPENDENT_AMBULATORY_CARE_PROVIDER_SITE_OTHER): Payer: BC Managed Care – PPO | Admitting: Unknown Physician Specialty

## 2021-12-06 VITALS — BP 112/71 | HR 60 | Temp 97.8°F | Ht 70.0 in | Wt 242.3 lb

## 2021-12-06 DIAGNOSIS — Z23 Encounter for immunization: Secondary | ICD-10-CM

## 2021-12-06 DIAGNOSIS — M779 Enthesopathy, unspecified: Secondary | ICD-10-CM

## 2021-12-06 DIAGNOSIS — I10 Essential (primary) hypertension: Secondary | ICD-10-CM

## 2021-12-06 DIAGNOSIS — H6120 Impacted cerumen, unspecified ear: Secondary | ICD-10-CM | POA: Insufficient documentation

## 2021-12-06 DIAGNOSIS — E782 Mixed hyperlipidemia: Secondary | ICD-10-CM

## 2021-12-06 DIAGNOSIS — H6123 Impacted cerumen, bilateral: Secondary | ICD-10-CM | POA: Diagnosis not present

## 2021-12-06 NOTE — Assessment & Plan Note (Signed)
Cleaned ears today

## 2021-12-06 NOTE — Progress Notes (Signed)
BP 112/71   Pulse 60   Temp 97.8 F (36.6 C) (Oral)   Ht _0  (1.778 m)   Wt 242 lb 4.8 oz (109.9 kg)   SpO2 98%   BMI 34.77 kg/m    Subjective:    Patient ID: Logan Willis, male    DOB: [birth year], 76 y.o.   MRN: [id number]  HPI: JAYDENN BOCCIO is a 76 y.o. male  Chief Complaint  Patient presents with   Hyperlipidemia   Hypertension   Hypertension Using medications without difficulty Average home BPs:  Does not check at home  No problems or lightheadedness No chest pain with exertion or shortness of breath No Edema  Hyperlipidemia Using medications without problems: No Muscle aches  Diet compliance:Exercise: States eats too much but active with yard work.    Last labs showed elevated ALT.  States stopped his foot fungus medication.    Relevant past medical, surgical, family and social history reviewed and updated as indicated. Interim medical history since our last visit reviewed. Allergies and medications reviewed and updated.  Review of Systems  Constitutional: Negative.   HENT: Negative.    Respiratory: Negative.    Musculoskeletal:        Knot develops on upper arm at night and painful.  Resolves during the day and better with Tylenol  Skin:        States onychomycosis is improving    Per HPI unless specifically indicated above     Objective:    BP 112/71   Pulse 60   Temp 97.8 F (36.6 C) (Oral)   Ht _1  (1.778 m)   Wt 242 lb 4.8 oz (109.9 kg)   SpO2 98%   BMI 34.77 kg/m   Wt Readings from Last 3 Encounters:  12/06/21 242 lb 4.8 oz (109.9 kg)  05/25/21 231 lb (104.8 kg)  05/10/21 239 lb (108.4 kg)    Physical Exam Constitutional:      General: He is not in acute distress.    Appearance: Normal appearance. He is well-developed.  HENT:     Head: Normocephalic and atraumatic.     Right Ear: There is impacted cerumen.     Left Ear: There is impacted cerumen.  Eyes:     General: Lids are normal. No scleral icterus.       Right  eye: No discharge.        Left eye: No discharge.     Conjunctiva/sclera: Conjunctivae normal.  Neck:     Vascular: No carotid bruit or JVD.  Cardiovascular:     Rate and Rhythm: Normal rate and regular rhythm.     Heart sounds: Normal heart sounds.  Pulmonary:     Effort: Pulmonary effort is normal. No respiratory distress.     Breath sounds: Normal breath sounds.  Abdominal:     Palpations: There is no hepatomegaly or splenomegaly.  Musculoskeletal:        General: Normal range of motion.     Cervical back: Normal range of motion and neck supple.  Skin:    General: Skin is warm and dry.     Coloration: Skin is not pale.     Findings: No rash.  Neurological:     Mental Status: He is alert and oriented to person, place, and time.  Psychiatric:        Behavior: Behavior normal.        Thought Content: Thought content normal.  Judgment: Judgment normal.     Results for orders placed or performed in visit on 05/16/21  PSA  Result Value Ref Range   Prostate Specific Ag, Serum 3.2 0.0 - 4.0 ng/mL  Lipid panel  Result Value Ref Range   Cholesterol, Total 120 100 - 199 mg/dL   Triglycerides 73 0 - 149 mg/dL   HDL 43 >39 mg/dL   VLDL Cholesterol Cal 15 5 - 40 mg/dL   LDL Chol Calc (NIH) 62 0 - 99 mg/dL   Chol/HDL Ratio 2.8 0.0 - 5.0 ratio  Bayer DCA Hb A1c Waived  Result Value Ref Range   HB A1C (BAYER DCA - WAIVED) 5.2 4.8 - 5.6 %  Thyroid Panel With TSH  Result Value Ref Range   TSH 2.060 0.450 - 4.500 uIU/mL   T4, Total 8.2 4.5 - 12.0 ug/dL   T3 Uptake Ratio 32 24 - 39 %   Free Thyroxine Index 2.6 1.2 - 4.9  CBC with Differential/Platelet  Result Value Ref Range   WBC 7.2 3.4 - 10.8 x10E3/uL   RBC 5.43 4.14 - 5.80 x10E6/uL   Hemoglobin 16.0 13.0 - 17.7 g/dL   Hematocrit 48.6 37.5 - 51.0 %   MCV 90 79 - 97 fL   MCH 29.5 26.6 - 33.0 pg   MCHC 32.9 31.5 - 35.7 g/dL   RDW 13.3 11.6 - 15.4 %   Platelets 170 150 - 450 x10E3/uL   Neutrophils 66 Not Estab. %    Lymphs 24 Not Estab. %   Monocytes 7 Not Estab. %   Eos 2 Not Estab. %   Basos 0 Not Estab. %   Neutrophils Absolute 4.8 1.4 - 7.0 x10E3/uL   Lymphocytes Absolute 1.8 0.7 - 3.1 x10E3/uL   Monocytes Absolute 0.5 0.1 - 0.9 x10E3/uL   EOS (ABSOLUTE) 0.1 0.0 - 0.4 x10E3/uL   Basophils Absolute 0.0 0.0 - 0.2 x10E3/uL   Immature Granulocytes 1 Not Estab. %   Immature Grans (Abs) 0.1 0.0 - 0.1 x10E3/uL  Comprehensive metabolic panel  Result Value Ref Range   Glucose 83 70 - 99 mg/dL   BUN 17 8 - 27 mg/dL   Creatinine, Ser 1.11 0.76 - 1.27 mg/dL   eGFR 70 >59 mL/min/1.73   BUN/Creatinine Ratio 15 10 - 24   Sodium 141 134 - 144 mmol/L   Potassium 4.4 3.5 - 5.2 mmol/L   Chloride 103 96 - 106 mmol/L   CO2 24 20 - 29 mmol/L   Calcium 9.0 8.6 - 10.2 mg/dL   Total Protein 6.3 6.0 - 8.5 g/dL   Albumin 4.2 3.7 - 4.7 g/dL   Globulin, Total 2.1 1.5 - 4.5 g/dL   Albumin/Globulin Ratio 2.0 1.2 - 2.2   Bilirubin Total 0.4 0.0 - 1.2 mg/dL   Alkaline Phosphatase 62 44 - 121 IU/L   AST 39 0 - 40 IU/L   ALT 98 (H) 0 - 44 IU/L      Assessment & Plan:   Problem List Items Addressed This Visit       Unprioritized   Ceruminosis    Cleaned ears today      Essential hypertension    Stable, continue present medications.        Relevant Orders   Comprehensive metabolic panel   Mixed hyperlipidemia    Check lipid panel today.  This has been stable      Relevant Orders   Lipid Panel w/o Chol/HDL Ratio   Need for shingles vaccine -  Primary   Relevant Orders   Zoster Recombinant (Shingrix ) (Completed)   Tendonitis    Right upper arm.  Recommend continuing Tylenol prn and strength exercises.  Offered PT, declined for now        Follow up plan: Return in about 6 months (around 06/06/2022), or if symptoms worsen or fail to improve.

## 2021-12-06 NOTE — Assessment & Plan Note (Signed)
Check lipid panel today.  This has been stable

## 2021-12-06 NOTE — Assessment & Plan Note (Signed)
Stable, continue present medications.   

## 2021-12-06 NOTE — Assessment & Plan Note (Signed)
Right upper arm.  Recommend continuing Tylenol prn and strength exercises.  Offered PT, declined for now

## 2021-12-07 LAB — COMPREHENSIVE METABOLIC PANEL
ALT: 29 IU/L (ref 0–44)
AST: 19 IU/L (ref 0–40)
Albumin/Globulin Ratio: 2.8 — ABNORMAL HIGH (ref 1.2–2.2)
Albumin: 4.5 g/dL (ref 3.8–4.8)
Alkaline Phosphatase: 55 IU/L (ref 44–121)
BUN/Creatinine Ratio: 16 (ref 10–24)
BUN: 16 mg/dL (ref 8–27)
Bilirubin Total: 0.4 mg/dL (ref 0.0–1.2)
CO2: 23 mmol/L (ref 20–29)
Calcium: 8.9 mg/dL (ref 8.6–10.2)
Chloride: 105 mmol/L (ref 96–106)
Creatinine, Ser: 0.99 mg/dL (ref 0.76–1.27)
Globulin, Total: 1.6 g/dL (ref 1.5–4.5)
Glucose: 85 mg/dL (ref 70–99)
Potassium: 4.5 mmol/L (ref 3.5–5.2)
Sodium: 141 mmol/L (ref 134–144)
Total Protein: 6.1 g/dL (ref 6.0–8.5)
eGFR: 80 mL/min/{1.73_m2} (ref >59)

## 2021-12-07 LAB — LIPID PANEL W/O CHOL/HDL RATIO
Cholesterol, Total: 109 mg/dL (ref 100–199)
HDL: 43 mg/dL (ref >39)
LDL Chol Calc (NIH): 50 mg/dL (ref 0–99)
Triglycerides: 76 mg/dL (ref 0–149)
VLDL Cholesterol Cal: 16 mg/dL (ref 5–40)

## 2022-03-15 ENCOUNTER — Telehealth: Payer: Self-pay | Admitting: Family Medicine

## 2022-03-15 NOTE — Telephone Encounter (Signed)
Contacted Versie Starks to schedule their annual wellness visit. Appointment made for 04/04/2022.  Sherol Dade; [facility] Direct Dial: [phone]

## 2022-04-04 ENCOUNTER — Ambulatory Visit (INDEPENDENT_AMBULATORY_CARE_PROVIDER_SITE_OTHER): Payer: BC Managed Care – PPO

## 2022-04-04 VITALS — Ht 70.0 in | Wt 242.0 lb

## 2022-04-04 DIAGNOSIS — Z0000 Encounter for general adult medical examination without abnormal findings: Secondary | ICD-10-CM

## 2022-04-04 NOTE — Patient Instructions (Signed)
Logan Willis , Thank you for taking time to come for your Medicare Wellness Visit. I appreciate your ongoing commitment to your health goals. Please review the following plan we discussed and let me know if I can assist you in the future.   These are the goals we discussed:  Goals      DIET - EAT MORE FRUITS AND VEGETABLES     Patient Stated     No goals        This is a list of the screening recommended for you and due dates:  Health Maintenance  Topic Date Due   COVID-19 Vaccine (6 - 2023-24 season) 12/16/2021   Colon Cancer Screening  04/28/2024   DTaP/Tdap/Td vaccine (3 - Td or Tdap) 11/29/2025   Pneumonia Vaccine  Completed   Flu Shot  Completed   Hepatitis C Screening: USPSTF Recommendation to screen - Ages 18-79 yo.  Completed   Zoster (Shingles) Vaccine  Completed   HPV Vaccine  Aged Out    Advanced directives: no  Conditions/risks identified: none  Next appointment: Follow up in one year for your annual wellness visit. 04/10/23 @ 9:45 am by phone  Preventive Care 65 Years and Older, Male  Preventive care refers to lifestyle choices and visits with your health care provider that can promote health and wellness. What does preventive care include? A yearly physical exam. This is also called an annual well check. Dental exams once or twice a year. Routine eye exams. Ask your health care provider how often you should have your eyes checked. Personal lifestyle choices, including: Daily care of your teeth and gums. Regular physical activity. Eating a healthy diet. Avoiding tobacco and drug use. Limiting alcohol use. Practicing safe sex. Taking low doses of aspirin every day. Taking vitamin and mineral supplements as recommended by your health care provider. What happens during an annual well check? The services and screenings done by your health care provider during your annual well check will depend on your age, overall health, lifestyle risk factors, and family  history of disease. Counseling  Your health care provider may ask you questions about your: Alcohol use. Tobacco use. Drug use. Emotional well-being. Home and relationship well-being. Sexual activity. Eating habits. History of falls. Memory and ability to understand (cognition). Work and work Statistician. Screening  You may have the following tests or measurements: Height, weight, and BMI. Blood pressure. Lipid and cholesterol levels. These may be checked every 5 years, or more frequently if you are over 65 years old. Skin check. Lung cancer screening. You may have this screening every year starting at age 70 if you have a 30-pack-year history of smoking and currently smoke or have quit within the past 15 years. Fecal occult blood test (FOBT) of the stool. You may have this test every year starting at age 45. Flexible sigmoidoscopy or colonoscopy. You may have a sigmoidoscopy every 5 years or a colonoscopy every 10 years starting at age 65. Prostate cancer screening. Recommendations will vary depending on your family history and other risks. Hepatitis C blood test. Hepatitis B blood test. Sexually transmitted disease (STD) testing. Diabetes screening. This is done by checking your blood sugar (glucose) after you have not eaten for a while (fasting). You may have this done every 1-3 years. Abdominal aortic aneurysm (AAA) screening. You may need this if you are a current or former smoker. Osteoporosis. You may be screened starting at age 24 if you are at high risk. Talk with your health care  provider about your test results, treatment options, and if necessary, the need for more tests. Vaccines  Your health care provider may recommend certain vaccines, such as: Influenza vaccine. This is recommended every year. Tetanus, diphtheria, and acellular pertussis (Tdap, Td) vaccine. You may need a Td booster every 10 years. Zoster vaccine. You may need this after age 79. Pneumococcal  13-valent conjugate (PCV13) vaccine. One dose is recommended after age 35. Pneumococcal polysaccharide (PPSV23) vaccine. One dose is recommended after age 67. Talk to your health care provider about which screenings and vaccines you need and how often you need them. This information is not intended to replace advice given to you by your health care provider. Make sure you discuss any questions you have with your health care provider. Document Released: 01/22/2015 Document Revised: 09/15/2015 Document Reviewed: 10/27/2014 Elsevier Interactive Patient Education  2017 [place] Prevention in the Home Falls can cause injuries. They can happen to people of all ages. There are many things you can do to make your home safe and to help prevent falls. What can I do on the outside of my home? Regularly fix the edges of walkways and driveways and fix any cracks. Remove anything that might make you trip as you walk through a door, such as a raised step or threshold. Trim any bushes or trees on the path to your home. Use bright outdoor lighting. Clear any walking paths of anything that might make someone trip, such as rocks or tools. Regularly check to see if handrails are loose or broken. Make sure that both sides of any steps have handrails. Any raised decks and porches should have guardrails on the edges. Have any leaves, snow, or ice cleared regularly. Use sand or salt on walking paths during winter. Clean up any spills in your garage right away. This includes oil or grease spills. What can I do in the bathroom? Use night lights. Install grab bars by the toilet and in the tub and shower. Do not use towel bars as grab bars. Use non-skid mats or decals in the tub or shower. If you need to sit down in the shower, use a plastic, non-slip stool. Keep the floor dry. Clean up any water that spills on the floor as soon as it happens. Remove soap buildup in the tub or shower regularly. Attach  bath mats securely with double-sided non-slip rug tape. Do not have throw rugs and other things on the floor that can make you trip. What can I do in the bedroom? Use night lights. Make sure that you have a light by your bed that is easy to reach. Do not use any sheets or blankets that are too big for your bed. They should not hang down onto the floor. Have a firm chair that has side arms. You can use this for support while you get dressed. Do not have throw rugs and other things on the floor that can make you trip. What can I do in the kitchen? Clean up any spills right away. Avoid walking on wet floors. Keep items that you use a lot in easy-to-reach places. If you need to reach something above you, use a strong step stool that has a grab bar. Keep electrical cords out of the way. Do not use floor polish or wax that makes floors slippery. If you must use wax, use non-skid floor wax. Do not have throw rugs and other things on the floor that can make you trip. What can I  do with my stairs? Do not leave any items on the stairs. Make sure that there are handrails on both sides of the stairs and use them. Fix handrails that are broken or loose. Make sure that handrails are as long as the stairways. Check any carpeting to make sure that it is firmly attached to the stairs. Fix any carpet that is loose or worn. Avoid having throw rugs at the top or bottom of the stairs. If you do have throw rugs, attach them to the floor with carpet tape. Make sure that you have a light switch at the top of the stairs and the bottom of the stairs. If you do not have them, ask someone to add them for you. What else can I do to help prevent falls? Wear shoes that: Do not have high heels. Have rubber bottoms. Are comfortable and fit you well. Are closed at the toe. Do not wear sandals. If you use a stepladder: Make sure that it is fully opened. Do not climb a closed stepladder. Make sure that both sides of the  stepladder are locked into place. Ask someone to hold it for you, if possible. Clearly mark and make sure that you can see: Any grab bars or handrails. First and last steps. Where the edge of each step is. Use tools that help you move around (mobility aids) if they are needed. These include: Canes. Walkers. Scooters. Crutches. Turn on the lights when you go into a dark area. Replace any light bulbs as soon as they burn out. Set up your furniture so you have a clear path. Avoid moving your furniture around. If any of your floors are uneven, fix them. If there are any pets around you, be aware of where they are. Review your medicines with your doctor. Some medicines can make you feel dizzy. This can increase your chance of falling. Ask your doctor what other things that you can do to help prevent falls. This information is not intended to replace advice given to you by your health care provider. Make sure you discuss any questions you have with your health care provider. Document Released: 10/22/2008 Document Revised: 06/03/2015 Document Reviewed: 01/30/2014 Elsevier Interactive Patient Education  2017 [employer].

## 2022-04-04 NOTE — Progress Notes (Signed)
I connected with  Logan Willis on 04/04/22 by a audio enabled telemedicine application and verified that I am speaking with the correct person using two identifiers.  Patient Location: Home  Provider Location: Office/Clinic  I discussed the limitations of evaluation and management by telemedicine. The patient expressed understanding and agreed to proceed.  Subjective:   Logan Willis is a 77 y.o. male who presents for Medicare Annual/Subsequent preventive examination.  Review of Systems     Cardiac Risk Factors include: advanced age (>1men, >8 women);hypertension;obesity (BMI >30kg/m2);dyslipidemia;male gender     Objective:    There were no vitals filed for this visit. There is no height or weight on file to calculate BMI.     04/04/2022   11:05 AM 03/24/2021    9:51 AM 04/29/2019    8:49 AM 08/22/2018    8:20 AM 08/19/2018   10:26 AM 08/19/2018    5:24 AM 12/10/2014    7:08 AM  Advanced Directives  Does Patient Have a Medical Advance Directive? No No No No No No No  Would patient like information on creating a medical advance directive? No - Patient declined No - Patient declined  No - Patient declined No - Patient declined No - Patient declined No - patient declined information    Current Medications (verified) Outpatient Encounter Medications as of 04/04/2022  Medication Sig   benazepril (LOTENSIN) 10 MG tablet Take 1 tablet (10 mg total) by mouth daily.   fexofenadine-pseudoephedrine (ALLEGRA-D 24) 180-240 MG 24 hr tablet Take 1 tablet by mouth daily as needed.   ketoconazole (NIZORAL) 2 % cream Apply to feet QHS   rosuvastatin (CRESTOR) 20 MG tablet Take 1 tablet (20 mg total) by mouth daily.   tamsulosin (FLOMAX) 0.4 MG CAPS capsule Take 1 capsule (0.4 mg total) by mouth daily.   valACYclovir (VALTREX) 500 MG tablet Take 2 tablets (1,000 mg total) by mouth 2 (two) times daily.   No facility-administered encounter medications on file as of 04/04/2022.     Allergies (verified) Patient has no known allergies.   History: Past Medical History:  Diagnosis Date   Actinic keratosis    Body mass index 35.0-35.9, adult    Cold sore    Hypertension    Kidney stone 08/2018   Past Surgical History:  Procedure Laterality Date   COLON SURGERY     colectomy 2008   COLONOSCOPY     COLONOSCOPY WITH PROPOFOL N/A 12/10/2014   Procedure: COLONOSCOPY WITH PROPOFOL;  Surgeon: Lucilla Lame, MD;  Location: [facility];  Service: Endoscopy;  Laterality: N/A;   COLONOSCOPY WITH PROPOFOL N/A 04/29/2019   Procedure: COLONOSCOPY WITH PROPOFOL;  Surgeon: Lucilla Lame, MD;  Location: [facility] ENDOSCOPY;  Service: Endoscopy;  Laterality: N/A;   EXTRACORPOREAL SHOCK WAVE LITHOTRIPSY Left 08/22/2018   Procedure: EXTRACORPOREAL SHOCK WAVE LITHOTRIPSY (ESWL);  Surgeon: Billey Co, MD;  Location: [facility];  Service: Urology;  Laterality: Left;   HERNIA REPAIR     Family History  Problem Relation Age of Onset   Heart disease Mother 70   Heart disease Father 15   Diabetes Sister    Stroke Sister    Cancer Neg Hx    COPD Neg Hx    Social History   Socioeconomic History   Marital status: Married    Spouse name: Not on file   Number of children: Not on file   Years of education: Not on file   Highest education level: Not on file  Occupational  History   Not on file  Tobacco Use   Smoking status: Never    Passive exposure: Never   Smokeless tobacco: Never  Vaping Use   Vaping Use: Never used  Substance and Sexual Activity   Alcohol use: No   Drug use: No   Sexual activity: Yes    Birth control/protection: None  Other Topics Concern   Not on file  Social History Narrative   Not on file   Social Determinants of Health   Financial Resource Strain: Low Risk  (04/04/2022)   Overall Financial Resource Strain (CARDIA)    Difficulty of Paying Living Expenses: Not hard at all  Food Insecurity: No Food Insecurity (04/04/2022)   Hunger Vital Sign     Worried About Running Out of Food in the Last Year: Never true    Ran Out of Food in the Last Year: Never true  Transportation Needs: No Transportation Needs (04/04/2022)   PRAPARE - Hydrologist (Medical): No    Lack of Transportation (Non-Medical): No  Physical Activity: Insufficiently Active (04/04/2022)   Exercise Vital Sign    Days of Exercise per Week: 3 days    Minutes of Exercise per Session: 30 min  Stress: No Stress Concern Present (04/04/2022)   [place]    Feeling of Stress : Not at all  Social Connections: Moderately Isolated (04/04/2022)   Social Connection and Isolation Panel [NHANES]    Frequency of Communication with Friends and Family: Never    Frequency of Social Gatherings with Friends and Family: Never    Attends Religious Services: More than 4 times per year    Active Member of [employer] or Organizations: No    Attends Music therapist: Never    Marital Status: Married    Tobacco Counseling Counseling given: Not Answered   Clinical Intake:  Pre-visit preparation completed: Yes  Pain : No/denies pain     Nutritional Risks: None Diabetes: No  How often do you need to have someone help you when you read instructions, pamphlets, or other written materials from your doctor or pharmacy?: 1 - Never  Diabetic?o  Interpreter Needed?: No  Information entered by :: Kirke Shaggy, LPN   Activities of Daily Living    04/04/2022   11:06 AM  In your present state of health, do you have any difficulty performing the following activities:  Hearing? 0  Vision? 0  Difficulty concentrating or making decisions? 0  Walking or climbing stairs? 0  Dressing or bathing? 0  Doing errands, shopping? 0  Preparing Food and eating ? N  Using the Toilet? N  In the past six months, have you accidently leaked urine? N  Do you have problems with loss of bowel  control? N  Managing your Medications? N  Managing your Finances? N  Housekeeping or managing your Housekeeping? N    Patient Care Team: Valerie Roys, DO as PCP - General (Family Medicine)  Indicate any recent Medical Services you may have received from other than [facility] providers in the past year (date may be approximate).     Assessment:   This is a routine wellness examination for Logan Willis.  Hearing/Vision screen Hearing Screening - Comments:: No aids Vision Screening - Comments:: Wears glasses- Dr.Porsilo  Dietary issues and exercise activities discussed: Current Exercise Habits: Home exercise routine, Type of exercise: walking;Other - see comments (yard work), Time (Minutes): 30, Frequency (Times/Week): 3, Weekly Exercise (  Minutes/Week): 90, Intensity: Mild   Goals Addressed             This Visit's Progress    DIET - EAT MORE FRUITS AND VEGETABLES         Depression Screen    04/04/2022   11:04 AM 12/06/2021    9:49 AM 05/25/2021    8:24 AM 05/10/2021    9:39 AM 03/24/2021    9:52 AM 03/01/2021    9:49 AM 11/25/2020    8:32 AM  PHQ 2/9 Scores  PHQ - 2 Score 0 0 0 0 0 0 0  PHQ- 9 Score 0 1 0 2 0 0 2    Fall Risk    04/04/2022   11:06 AM 12/06/2021    9:49 AM 05/25/2021    8:24 AM 03/24/2021    9:50 AM 03/01/2021    9:49 AM  Fall Risk   Falls in the past year? 0 0 0 0 0  Number falls in past yr: 0 0 0 0 0  Injury with Fall? 0 0 0 0 0  Risk for fall due to : No Fall Risks No Fall Risks No Fall Risks  No Fall Risks  Follow up Falls prevention discussed;Falls evaluation completed Falls evaluation completed Falls evaluation completed Falls evaluation completed;Education provided;Falls prevention discussed Falls evaluation completed    FALL RISK PREVENTION PERTAINING TO THE HOME:  Any stairs in or around the home? Yes  If so, are there any without handrails? No  Home free of loose throw rugs in walkways, pet beds, electrical cords, etc? Yes  Adequate lighting  in your home to reduce risk of falls? Yes   ASSISTIVE DEVICES UTILIZED TO PREVENT FALLS:  Life alert? No  Use of a cane, walker or w/c? No  Grab bars in the bathroom? No  Shower chair or bench in shower? No  Elevated toilet seat or a handicapped toilet? No   Cognitive Function:        04/04/2022   11:09 AM  6CIT Screen  What Year? 0 points  What month? 0 points  What time? 0 points  Count back from 20 0 points  Months in reverse 0 points  Repeat phrase 0 points  Total Score 0 points    Immunizations Immunization History  Administered Date(s) Administered   COVID-19, mRNA, vaccine(Comirnaty)12 years and older 10/21/2021   Fluad Quad(high Dose 65+) 09/24/2018, 10/14/2020, 10/03/2021   Influenza, High Dose Seasonal PF 09/26/2016, 10/02/2017   Influenza-Unspecified 09/24/2014, 10/10/2015, 10/10/2019   PFIZER(Purple Top)SARS-COV-2 Vaccination 03/06/2019, 04/01/2019, 12/31/2019   Pfizer Covid-19 Vaccine Bivalent Booster 38yrs & up 10/27/2020   Pneumococcal Conjugate-13 05/05/2014   Pneumococcal Polysaccharide-23 07/31/2017   Pneumococcal-Unspecified 07/22/2012   Respiratory Syncytial Virus Vaccine,Recomb Aduvanted(Arexvy) 10/28/2021   Td 11/23/2005   Tdap 11/30/2015   Zoster Recombinat (Shingrix) 11/25/2020, 12/06/2021   Zoster, Live 07/27/2011    TDAP status: Up to date  Flu Vaccine status: Up to date  Pneumococcal vaccine status: Up to date  Covid-19 vaccine status: Completed vaccines  Qualifies for Shingles Vaccine? Yes   Zostavax completed No   Shingrix Completed?: Yes  Screening Tests Health Maintenance  Topic Date Due   COVID-19 Vaccine (6 - 2023-24 season) 12/16/2021   COLONOSCOPY (Pts 45-68yrs Insurance coverage will need to be confirmed)  04/28/2024   DTaP/Tdap/Td (3 - Td or Tdap) 11/29/2025   Pneumonia Vaccine 53+ Years old  Completed   INFLUENZA VACCINE  Completed   Hepatitis C Screening  Completed  Zoster Vaccines- Shingrix  Completed   HPV  VACCINES  Aged Out    Health Maintenance  Health Maintenance Due  Topic Date Due   COVID-19 Vaccine (6 - 2023-24 season) 12/16/2021    Colorectal cancer screening: Type of screening: Colonoscopy. Completed 04/29/19. Repeat every 5 years  Lung Cancer Screening: (Low Dose CT Chest recommended if Age 42-80 years, 30 pack-year currently smoking OR have quit w/in 15years.) does not qualify.   Additional Screening:  Hepatitis C Screening: does qualify; Completed 05/24/15  Vision Screening: Recommended annual ophthalmology exams for early detection of glaucoma and other disorders of the eye. Is the patient up to date with their annual eye exam?  Yes  Who is the provider or what is the name of the office in which the patient attends annual eye exams? Dr.Porsilo If pt is not established with a provider, would they like to be referred to a provider to establish care? No .   Dental Screening: Recommended annual dental exams for proper oral hygiene  Community Resource Referral / Chronic Care Management: CRR required this visit?  No   CCM required this visit?  No      Plan:     I have personally reviewed and noted the following in the patient's chart:   Medical and social history Use of alcohol, tobacco or illicit drugs  Current medications and supplements including opioid prescriptions. Patient is not currently taking opioid prescriptions. Functional ability and status Nutritional status Physical activity Advanced directives List of other physicians Hospitalizations, surgeries, and ER visits in previous 12 months Vitals Screenings to include cognitive, depression, and falls Referrals and appointments  In addition, I have reviewed and discussed with patient certain preventive protocols, quality metrics, and best practice recommendations. A written personalized care plan for preventive services as well as general preventive health recommendations were provided to patient.      Dionisio David, LPN   [phone]   Nurse Notes: none

## 2022-04-26 ENCOUNTER — Ambulatory Visit: Payer: BC Managed Care – PPO | Admitting: Urology

## 2022-05-03 ENCOUNTER — Encounter: Payer: Self-pay | Admitting: Urology

## 2022-05-03 ENCOUNTER — Ambulatory Visit: Payer: BC Managed Care – PPO | Admitting: Urology

## 2022-05-03 ENCOUNTER — Ambulatory Visit
Admission: RE | Admit: 2022-05-03 | Discharge: 2022-05-03 | Disposition: A | Discharge status: Home / Self Care | Payer: BC Managed Care – PPO | Source: Ambulatory Visit | Attending: Urology | Admitting: Urology

## 2022-05-03 VITALS — BP 122/77 | HR 68 | Ht 70.0 in | Wt 235.0 lb

## 2022-05-03 DIAGNOSIS — R350 Frequency of micturition: Secondary | ICD-10-CM | POA: Insufficient documentation

## 2022-05-03 DIAGNOSIS — Z87442 Personal history of urinary calculi: Secondary | ICD-10-CM | POA: Diagnosis not present

## 2022-05-03 DIAGNOSIS — Z125 Encounter for screening for malignant neoplasm of prostate: Secondary | ICD-10-CM | POA: Diagnosis not present

## 2022-05-03 DIAGNOSIS — Z0389 Encounter for observation for other suspected diseases and conditions ruled out: Secondary | ICD-10-CM | POA: Diagnosis not present

## 2022-05-03 DIAGNOSIS — N138 Other obstructive and reflux uropathy: Secondary | ICD-10-CM

## 2022-05-03 DIAGNOSIS — N401 Enlarged prostate with lower urinary tract symptoms: Secondary | ICD-10-CM | POA: Diagnosis not present

## 2022-05-03 DIAGNOSIS — Z09 Encounter for follow-up examination after completed treatment for conditions other than malignant neoplasm: Secondary | ICD-10-CM | POA: Diagnosis not present

## 2022-05-03 DIAGNOSIS — R351 Nocturia: Secondary | ICD-10-CM

## 2022-05-03 DIAGNOSIS — N200 Calculus of kidney: Secondary | ICD-10-CM

## 2022-05-03 LAB — BLADDER SCAN AMB NON-IMAGING

## 2022-05-03 MED ORDER — TAMSULOSIN HCL 0.4 MG PO CAPS: 0.4000 mg | ORAL_CAPSULE | Freq: Every day | ORAL | 4 refills | Status: DC

## 2022-05-03 NOTE — Patient Instructions (Signed)
Nocturia refers to the need to wake up during the night to urinate, which can disrupt your sleep and impact your overall well-being. Fortunately, there are several strategies you can employ to help prevent or manage nocturia. It's important to consult with your healthcare provider before making any significant changes to your routine. Here are some helpful strategies to consider:  Get tested for sleep apnea: One of the most common causes of frequent urination overnight to sleep apnea.  Often times if patients do have sleep apnea using a CPAP machine can significantly improve or resolve overnight urination  Limit Fluid Intake Before Bed: Avoid drinking large amounts of fluids in the evening, especially within a few hours of bedtime. Consume most of your daily fluid intake earlier in the day to reduce the need to urinate at night.  Monitor Your Diet: Limit your intake of caffeine and alcohol, as these substances can increase urine production and irritate the bladder.  Avoid diet, "zero calorie," and artificially sweetened drinks, especially sodas, in the afternoon or evening. Be mindful of consuming foods and drinks with high water content before bedtime, such as watermelon and herbal teas.  Time Your Medications: If you're taking medications that contribute to increased urination, consult your healthcare provider about adjusting the timing of these medications to minimize their impact during the night.  Practice Double Voiding: Before going to bed, make an effort to empty your bladder twice within a short period. This can help reduce the amount of urine left in your bladder before sleep.  Bladder Training: Gradually increase the time between bathroom visits during the day to train your bladder to hold larger volumes of urine. Over time, this can help reduce the frequency of nighttime awakenings to urinate.  Elevate Your Legs During the Day: Elevating your legs during the day can help minimize  fluid retention in your lower extremities, which might reduce nighttime urination.  Pelvic Floor Exercises: Strengthening your pelvic floor muscles through Kegel exercises can help improve bladder control and potentially reduce the urge to urinate at night.  Create a Relaxing Bedtime Routine: Stress and anxiety can exacerbate nocturia. Engage in calming activities before bed, such as reading, listening to soothing music, or practicing relaxation techniques.  Stay Active: Engage in regular physical activity, but avoid intense exercise close to bedtime, as this can increase your body's demand for fluids.  Maintain a Healthy Weight: Excess weight can compress the bladder and contribute to bladder and urinary issues. Aim to achieve and maintain a healthy weight through a balanced diet and regular exercise.  Remember that every individual is unique, and the effectiveness of these strategies may vary. It's important to work with your healthcare provider to develop a plan that suits your specific needs and addresses any underlying causes of nocturia.

## 2022-05-03 NOTE — Progress Notes (Signed)
   05/03/2022 12:45 PM   Logan Willis 1945/04/18 [id number]  Reason for visit: Follow up nocturia, history of kidney stones, PSA screening  HPI: 77 year old male who underwent an uncomplicated left shockwave lithotripsy in 2020 for an 8 mm proximal ureteral stone, no stone events since that time.  He has been on Flomax long-term, primary urinary complaints are nocturia 2-4 times overnight.  He really denies significant urinary problems during the day, and urinalysis and PVRs have been normal.  Prostate measured 57 g on CT from 2020 with a nondistended bladder.  He denies any changes in his urination over the last year.  PVR normal again today at 12ml.  PSA has been normal, most recently 3.2 in May 2023 which is stable from prior values.  We reviewed the AUA guidelines that do not recommend routine screening in men over age 103.  PSA density is also very reassuring at 0.06.  I previously have recommended sleep apnea evaluation numerous times, but he has deferred  I personally viewed and interpreted the KUB today that shows no evidence of recurrent kidney stones.  At this point he is stable from a urology perspective and I think he can follow-up with his PCP and have his Flomax filled through PCP.  Would not recommend further PSA screening for the guideline recommendations.  Continue Flomax, refilled.  Can be filled by PCP moving forward Follow-up with urology as needed   Sondra Come, MD  [facility] [address] [place], [place] [address] [phone]

## 2022-06-06 ENCOUNTER — Encounter: Payer: Self-pay | Admitting: Family Medicine

## 2022-06-06 ENCOUNTER — Ambulatory Visit (INDEPENDENT_AMBULATORY_CARE_PROVIDER_SITE_OTHER): Payer: BC Managed Care – PPO | Admitting: Family Medicine

## 2022-06-06 VITALS — BP 111/64 | HR 55 | Temp 98.3°F | Ht 69.0 in | Wt 235.0 lb

## 2022-06-06 DIAGNOSIS — N138 Other obstructive and reflux uropathy: Secondary | ICD-10-CM | POA: Diagnosis not present

## 2022-06-06 DIAGNOSIS — I10 Essential (primary) hypertension: Secondary | ICD-10-CM

## 2022-06-06 DIAGNOSIS — B001 Herpesviral vesicular dermatitis: Secondary | ICD-10-CM

## 2022-06-06 DIAGNOSIS — Z0000 Encounter for general adult medical examination without abnormal findings: Secondary | ICD-10-CM | POA: Diagnosis not present

## 2022-06-06 DIAGNOSIS — N401 Enlarged prostate with lower urinary tract symptoms: Secondary | ICD-10-CM

## 2022-06-06 DIAGNOSIS — E782 Mixed hyperlipidemia: Secondary | ICD-10-CM | POA: Diagnosis not present

## 2022-06-06 DIAGNOSIS — N183 Chronic kidney disease, stage 3 unspecified: Secondary | ICD-10-CM | POA: Diagnosis not present

## 2022-06-06 LAB — URINALYSIS, ROUTINE W REFLEX MICROSCOPIC
Bilirubin, UA: NEGATIVE
Glucose, UA: NEGATIVE
Leukocytes,UA: NEGATIVE
Nitrite, UA: NEGATIVE
RBC, UA: NEGATIVE
Specific Gravity, UA: 1.025 (ref 1.005–1.030)
Urobilinogen, Ur: 1 mg/dL (ref 0.2–1.0)
pH, UA: 5.5 (ref 5.0–7.5)

## 2022-06-06 LAB — MICROALBUMIN, URINE WAIVED
Creatinine, Urine Waived: 100 mg/dL (ref 10–300)
Microalb, Ur Waived: 80 mg/L — ABNORMAL HIGH (ref 0–19)

## 2022-06-06 MED ORDER — ROSUVASTATIN CALCIUM 20 MG PO TABS: 20.0000 mg | ORAL_TABLET | Freq: Every day | ORAL | 1 refills | Status: DC

## 2022-06-06 MED ORDER — VALACYCLOVIR HCL 500 MG PO TABS
1000.0000 mg | ORAL_TABLET | Freq: Two times a day (BID) | ORAL | 12 refills | Status: DC
Start: 2022-06-06 — End: 2023-07-09

## 2022-06-06 MED ORDER — TAMSULOSIN HCL 0.4 MG PO CAPS: 0.4000 mg | ORAL_CAPSULE | Freq: Every day | ORAL | 1 refills | Status: DC

## 2022-06-06 MED ORDER — BENAZEPRIL HCL 10 MG PO TABS: 10.0000 mg | ORAL_TABLET | Freq: Every day | ORAL | 1 refills | Status: DC

## 2022-06-06 NOTE — Assessment & Plan Note (Signed)
Under good control on current regimen. Continue current regimen. Continue to monitor. Call with any concerns. Refills given. Labs drawn today.   

## 2022-06-06 NOTE — Progress Notes (Signed)
BP 111/64   Pulse (!) 55   Temp 98.3 F (36.8 C) (Oral)   Ht 5\' 9"  (1.753 m)   Wt 235 lb (106.6 kg)   SpO2 97%   BMI 34.70 kg/m    Subjective:    Patient ID: Logan Willis, male    DOB: 1945/06/09, 77 y.o.   MRN: [id number]  HPI: Logan Willis is a 77 y.o. male presenting on 06/06/2022 for comprehensive medical examination. Current medical complaints include:  HYPERTENSION / HYPERLIPIDEMIA Satisfied with current treatment? yes Duration of hypertension: chronic BP monitoring frequency: not checking BP medication side effects: no Past BP meds: benazepril  Duration of hyperlipidemia: chronic Cholesterol medication side effects: no Cholesterol supplements: none Past cholesterol medications: crestor Medication compliance: excellent compliance Aspirin: no Recent stressors: no Recurrent headaches: no Visual changes: no Palpitations: no Dyspnea: no Chest pain: no Lower extremity edema: no Dizzy/lightheaded: no  BPH BPH status: controlled Satisfied with current treatment?: yes Medication side effects: no Medication compliance: excellent compliance Duration: chronic Nocturia: 2-3x per night Urinary frequency:no Incomplete voiding: no Urgency: no Weak urinary stream: no Straining to start stream: no Dysuria: no Onset: gradual Severity: mild  Interim Problems from his last visit: no  Depression Screen done today and results listed below:     06/06/2022    8:14 AM 04/04/2022   11:04 AM 12/06/2021    9:49 AM 05/25/2021    8:24 AM 05/10/2021    9:39 AM  Depression screen PHQ 2/9  Decreased Interest 0 0 0 0 0  Down, Depressed, Hopeless 0 0 0 0 0  PHQ - 2 Score 0 0 0 0 0  Altered sleeping 0 0 1 0 1  Tired, decreased energy 0 0 0 0 1  Change in appetite 0 0 0 0 0  Feeling bad or failure about yourself  0 0 0 0 0  Trouble concentrating 0 0 0 0 0  Moving slowly or fidgety/restless 0 0 0 0 0  Suicidal thoughts 0 0 0 0 0  PHQ-9 Score 0 0 1 0 2  Difficult doing  work/chores Not difficult at all Not difficult at all Not difficult at all Not difficult at all     Past Medical History:  Past Medical History:  Diagnosis Date   Actinic keratosis    Body mass index 35.0-35.9, adult    Cold sore    Hypertension    Kidney stone 08/2018    Surgical History:  Past Surgical History:  Procedure Laterality Date   COLON SURGERY     colectomy 2008   COLONOSCOPY     COLONOSCOPY WITH PROPOFOL N/A 12/10/2014   Procedure: COLONOSCOPY WITH PROPOFOL;  Surgeon: Midge Minium, MD;  Location: [facility];  Service: Endoscopy;  Laterality: N/A;   COLONOSCOPY WITH PROPOFOL N/A 04/29/2019   Procedure: COLONOSCOPY WITH PROPOFOL;  Surgeon: Midge Minium, MD;  Location: [facility];  Service: Endoscopy;  Laterality: N/A;   EXTRACORPOREAL SHOCK WAVE LITHOTRIPSY Left 08/22/2018   Procedure: EXTRACORPOREAL SHOCK WAVE LITHOTRIPSY (ESWL);  Surgeon: Sondra Come, MD;  Location: [facility];  Service: Urology;  Laterality: Left;   HERNIA REPAIR      Medications:  Current Outpatient Medications on File Prior to Visit  Medication Sig   fexofenadine-pseudoephedrine (ALLEGRA-D 24) 180-240 MG 24 hr tablet Take 1 tablet by mouth daily as needed.   ketoconazole (NIZORAL) 2 % cream Apply to feet QHS   No current facility-administered medications on file prior to visit.  Allergies:  No Known Allergies  Social History:  Social History   Socioeconomic History   Marital status: Married    Spouse name: Not on file   Number of children: Not on file   Years of education: Not on file   Highest education level: Not on file  Occupational History   Not on file  Tobacco Use   Smoking status: Never    Passive exposure: Never   Smokeless tobacco: Never  Vaping Use   Vaping Use: Never used  Substance and Sexual Activity   Alcohol use: No   Drug use: No   Sexual activity: Yes    Birth control/protection: None  Other Topics Concern   Not on file  Social History  Narrative   Not on file   Social Determinants of Health   Financial Resource Strain: Low Risk  (04/04/2022)   Overall Financial Resource Strain (CARDIA)    Difficulty of Paying Living Expenses: Not hard at all  Food Insecurity: No Food Insecurity (04/04/2022)   Hunger Vital Sign    Worried About Running Out of Food in the Last Year: Never true    Ran Out of Food in the Last Year: Never true  Transportation Needs: No Transportation Needs (04/04/2022)   PRAPARE - Administrator, Civil Service (Medical): No    Lack of Transportation (Non-Medical): No  Physical Activity: Insufficiently Active (04/04/2022)   Exercise Vital Sign    Days of Exercise per Week: 3 days    Minutes of Exercise per Session: 30 min  Stress: No Stress Concern Present (04/04/2022)   [employer] of Occupational Health - Occupational Stress Questionnaire    Feeling of Stress : Not at all  Social Connections: Moderately Isolated (04/04/2022)   Social Connection and Isolation Panel [NHANES]    Frequency of Communication with Friends and Family: Never    Frequency of Social Gatherings with Friends and Family: Never    Attends Religious Services: More than 4 times per year    Active Member of [employer] or Organizations: No    Attends Banker Meetings: Never    Marital Status: Married  Catering manager Violence: Not At Risk (04/04/2022)   Humiliation, Afraid, Rape, and Kick questionnaire    Fear of Current or Ex-Partner: No    Emotionally Abused: No    Physically Abused: No    Sexually Abused: No   Social History   Tobacco Use  Smoking Status Never   Passive exposure: Never  Smokeless Tobacco Never   Social History   Substance and Sexual Activity  Alcohol Use No    Family History:  Family History  Problem Relation Age of Onset   Heart disease Mother 68   Heart disease Father 88   Diabetes Sister    Stroke Sister    Cancer Neg Hx    COPD Neg Hx     Past medical history,  surgical history, medications, allergies, family history and social history reviewed with patient today and changes made to appropriate areas of the chart.   Review of Systems  Constitutional: Negative.   HENT: Negative.    Eyes: Negative.        Dry eyes  Respiratory: Negative.    Cardiovascular: Negative.   Gastrointestinal: Negative.        Increased burping with food choices   Genitourinary: Negative.   Musculoskeletal:  Positive for back pain. Negative for falls, joint pain, myalgias and neck pain.  Skin: Negative.   Neurological:  Negative.   Endo/Heme/Allergies:  Positive for environmental allergies. Negative for polydipsia. Does not bruise/bleed easily.  Psychiatric/Behavioral: Negative.     All other ROS negative except what is listed above and in the HPI.      Objective:    BP 111/64   Pulse (!) 55   Temp 98.3 F (36.8 C) (Oral)   Ht 5\' 9"  (1.753 m)   Wt 235 lb (106.6 kg)   SpO2 97%   BMI 34.70 kg/m   Wt Readings from Last 3 Encounters:  06/06/22 235 lb (106.6 kg)  05/03/22 235 lb (106.6 kg)  04/04/22 242 lb (109.8 kg)    Physical Exam Vitals and nursing note reviewed.  Constitutional:      General: He is not in acute distress.    Appearance: Normal appearance. He is obese. He is not ill-appearing, toxic-appearing or diaphoretic.  HENT:     Head: Normocephalic and atraumatic.     Right Ear: Tympanic membrane, ear canal and external ear normal. There is no impacted cerumen.     Left Ear: Tympanic membrane, ear canal and external ear normal. There is no impacted cerumen.     Nose: Nose normal. No congestion or rhinorrhea.     Mouth/Throat:     Mouth: Mucous membranes are moist.     Pharynx: Oropharynx is clear. No oropharyngeal exudate or posterior oropharyngeal erythema.  Eyes:     General: No scleral icterus.       Right eye: No discharge.        Left eye: No discharge.     Extraocular Movements: Extraocular movements intact.     Conjunctiva/sclera:  Conjunctivae normal.     Pupils: Pupils are equal, round, and reactive to light.  Neck:     Vascular: No carotid bruit.  Cardiovascular:     Rate and Rhythm: Normal rate and regular rhythm.     Pulses: Normal pulses.     Heart sounds: No murmur heard.    No friction rub. No gallop.  Pulmonary:     Effort: Pulmonary effort is normal. No respiratory distress.     Breath sounds: Normal breath sounds. No stridor. No wheezing, rhonchi or rales.  Chest:     Chest wall: No tenderness.  Abdominal:     General: Abdomen is flat. Bowel sounds are normal. There is no distension.     Palpations: Abdomen is soft. There is no mass.     Tenderness: There is no abdominal tenderness. There is no right CVA tenderness, left CVA tenderness, guarding or rebound.     Hernia: No hernia is present.  Genitourinary:    Comments: Genital exam deferred with shared decision making Musculoskeletal:        General: No swelling, tenderness, deformity or signs of injury.     Cervical back: Normal range of motion and neck supple. No rigidity. No muscular tenderness.     Right lower leg: No edema.     Left lower leg: No edema.  Lymphadenopathy:     Cervical: No cervical adenopathy.  Skin:    General: Skin is warm and dry.     Capillary Refill: Capillary refill takes less than 2 seconds.     Coloration: Skin is not jaundiced or pale.     Findings: No bruising, erythema, lesion or rash.  Neurological:     General: No focal deficit present.     Mental Status: He is alert and oriented to person, place, and time.  Cranial Nerves: No cranial nerve deficit.     Sensory: No sensory deficit.     Motor: No weakness.     Coordination: Coordination normal.     Gait: Gait normal.     Deep Tendon Reflexes: Reflexes normal.  Psychiatric:        Mood and Affect: Mood normal.        Behavior: Behavior normal.        Thought Content: Thought content normal.        Judgment: Judgment normal.     Results for orders  placed or performed in visit on 05/03/22  BLADDER SCAN AMB NON-IMAGING  Result Value Ref Range   Scan Result 16ml       Assessment & Plan:   Problem List Items Addressed This Visit       Cardiovascular and Mediastinum   Essential hypertension    Under good control on current regimen. Continue current regimen. Continue to monitor. Call with any concerns. Refills given. Labs drawn today.        Relevant Medications   benazepril (LOTENSIN) 10 MG tablet   rosuvastatin (CRESTOR) 20 MG tablet   Other Relevant Orders   TSH   Urinalysis, Routine w reflex microscopic   Microalbumin, Urine Waived     Genitourinary   Benign prostatic hyperplasia with urinary obstruction    Under good control on current regimen. Continue current regimen. Continue to monitor. Call with any concerns. Refills given. Labs drawn today.       Relevant Medications   tamsulosin (FLOMAX) 0.4 MG CAPS capsule   Other Relevant Orders   Comprehensive metabolic panel   CBC with Differential/Platelet   PSA   Urinalysis, Routine w reflex microscopic   CKD (chronic kidney disease) stage 3, GFR 30-59 ml/min (HCC)    Rechecking labs today. Await results. Treat as needed.       Relevant Orders   Comprehensive metabolic panel   CBC with Differential/Platelet     Other   Mixed hyperlipidemia    Under good control on current regimen. Continue current regimen. Continue to monitor. Call with any concerns. Refills given. Labs drawn today.       Relevant Medications   benazepril (LOTENSIN) 10 MG tablet   rosuvastatin (CRESTOR) 20 MG tablet   Other Relevant Orders   Comprehensive metabolic panel   CBC with Differential/Platelet   Lipid Panel w/o Chol/HDL Ratio   Other Visit Diagnoses     Routine general medical examination at a health care facility    -  Primary   Vaccines up to date. Screening labs checked today. Colonoscopy up to date. Continue diet and exercise. Call with any concerns.   Cold sore        Relevant Medications   valACYclovir (VALTREX) 500 MG tablet       LABORATORY TESTING:  Health maintenance labs ordered today as discussed above.   The natural history of prostate cancer and ongoing controversy regarding screening and potential treatment outcomes of prostate cancer has been discussed with the patient. The meaning of a false positive PSA and a false negative PSA has been discussed. He indicates understanding of the limitations of this screening test and wishes to proceed with screening PSA testing.   IMMUNIZATIONS:   - Tdap: Tetanus vaccination status reviewed: last tetanus booster within 10 years. - Influenza: Postponed to flu season - Pneumovax: Up to date - Prevnar: Up to date - COVID: Up to date - HPV: Not applicable - Shingrix vaccine:  Up to date  SCREENING: - Colonoscopy: Up to date  Discussed with patient purpose of the colonoscopy is to detect colon cancer at curable precancerous or early stages   PATIENT COUNSELING:    Sexuality: Discussed sexually transmitted diseases, partner selection, use of condoms, avoidance of unintended pregnancy  and contraceptive alternatives.   Advised to avoid cigarette smoking.  I discussed with the patient that most people either abstain from alcohol or drink within safe limits (<=14/week and <=4 drinks/occasion for males, <=7/weeks and <= 3 drinks/occasion for females) and that the risk for alcohol disorders and other health effects rises proportionally with the number of drinks per week and how often a drinker exceeds daily limits.  Discussed cessation/primary prevention of drug use and availability of treatment for abuse.   Diet: Encouraged to adjust caloric intake to maintain  or achieve ideal body weight, to reduce intake of dietary saturated fat and total fat, to limit sodium intake by avoiding high sodium foods and not adding table salt, and to maintain adequate dietary potassium and calcium preferably from fresh fruits,  vegetables, and low-fat dairy products.    stressed the importance of regular exercise  Injury prevention: Discussed safety belts, safety helmets, smoke detector, smoking near bedding or upholstery.   Dental health: Discussed importance of regular tooth brushing, flossing, and dental visits.   Follow up plan: NEXT PREVENTATIVE PHYSICAL DUE IN 1 YEAR. Return in about 6 months (around 12/07/2022).

## 2022-06-06 NOTE — Assessment & Plan Note (Signed)
Rechecking labs today. Await results. Treat as needed.  °

## 2022-06-07 LAB — TSH: TSH: 0.919 u[IU]/mL (ref 0.450–4.500)

## 2022-06-08 LAB — COMPREHENSIVE METABOLIC PANEL
ALT: 21 IU/L (ref 0–44)
AST: 12 IU/L (ref 0–40)
Albumin/Globulin Ratio: 2.7 — ABNORMAL HIGH (ref 1.2–2.2)
Albumin: 4.3 g/dL (ref 3.8–4.8)
Alkaline Phosphatase: 57 IU/L (ref 44–121)
BUN/Creatinine Ratio: 18 (ref 10–24)
BUN: 20 mg/dL (ref 8–27)
Bilirubin Total: 0.4 mg/dL (ref 0.0–1.2)
CO2: 22 mmol/L (ref 20–29)
Calcium: 9.1 mg/dL (ref 8.6–10.2)
Chloride: 107 mmol/L — ABNORMAL HIGH (ref 96–106)
Creatinine, Ser: 1.1 mg/dL (ref 0.76–1.27)
Globulin, Total: 1.6 g/dL (ref 1.5–4.5)
Glucose: 87 mg/dL (ref 70–99)
Potassium: 4.6 mmol/L (ref 3.5–5.2)
Sodium: 143 mmol/L (ref 134–144)
Total Protein: 5.9 g/dL — ABNORMAL LOW (ref 6.0–8.5)
eGFR: 70 mL/min/{1.73_m2} (ref >59)

## 2022-06-08 LAB — LIPID PANEL W/O CHOL/HDL RATIO
Cholesterol, Total: 106 mg/dL (ref 100–199)
HDL: 44 mg/dL (ref >39)
LDL Chol Calc (NIH): 47 mg/dL (ref 0–99)
Triglycerides: 73 mg/dL (ref 0–149)
VLDL Cholesterol Cal: 15 mg/dL (ref 5–40)

## 2022-06-08 LAB — CBC WITH DIFFERENTIAL/PLATELET
Basophils Absolute: 0 10*3/uL (ref 0.0–0.2)
Basos: 0 %
EOS (ABSOLUTE): 0.1 10*3/uL (ref 0.0–0.4)
Eos: 2 %
Hematocrit: 44.7 % (ref 37.5–51.0)
Hemoglobin: 14.9 g/dL (ref 13.0–17.7)
Immature Grans (Abs): 0 10*3/uL (ref 0.0–0.1)
Immature Granulocytes: 0 %
Lymphocytes Absolute: 1 10*3/uL (ref 0.7–3.1)
Lymphs: 22 %
MCH: 30.2 pg (ref 26.6–33.0)
MCHC: 33.3 g/dL (ref 31.5–35.7)
MCV: 91 fL (ref 79–97)
Monocytes Absolute: 0.4 10*3/uL (ref 0.1–0.9)
Monocytes: 9 %
Neutrophils Absolute: 3 10*3/uL (ref 1.4–7.0)
Neutrophils: 67 %
Platelets: 137 10*3/uL — ABNORMAL LOW (ref 150–450)
RBC: 4.94 x10E6/uL (ref 4.14–5.80)
RDW: 13.3 % (ref 11.6–15.4)
WBC: 4.5 10*3/uL (ref 3.4–10.8)

## 2022-06-08 LAB — PSA: Prostate Specific Ag, Serum: 3.3 ng/mL (ref 0.0–4.0)

## 2022-10-10 ENCOUNTER — Other Ambulatory Visit: Payer: Self-pay | Admitting: Family Medicine

## 2022-10-11 NOTE — Telephone Encounter (Signed)
Reordered 06/06/22 #90 1 RF  Requested Prescriptions  Refused Prescriptions Disp Refills   rosuvastatin (CRESTOR) 20 MG tablet [Pharmacy Med Name: ROSUVASTATIN CALCIUM 20 MG TAB] 90 tablet 1    Sig: TAKE 1 TABLET BY MOUTH EVERY DAY     Cardiovascular:  Antilipid - Statins 2 Failed - 10/10/2022 12:41 PM      Failed - Lipid Panel in normal range within the last 12 months    Cholesterol, Total  Date Value Ref Range Status  06/06/2022 106 100 - 199 mg/dL Final   LDL Chol Calc (NIH)  Date Value Ref Range Status  06/06/2022 47 0 - 99 mg/dL Final   HDL  Date Value Ref Range Status  06/06/2022 44 >39 mg/dL Final   Triglycerides  Date Value Ref Range Status  06/06/2022 73 0 - 149 mg/dL Final         Passed - Cr in normal range and within 360 days    Creatinine, Ser  Date Value Ref Range Status  06/06/2022 1.10 0.76 - 1.27 mg/dL Final         Passed - Patient is not pregnant      Passed - Valid encounter within last 12 months    Recent Outpatient Visits           4 months ago Routine general medical examination at a health care facility   [facility], Megan P, DO   10 months ago Need for shingles vaccine   [place] [facility] Gabriel Cirri, NP   1 year ago Onychomycosis   [place] [facility] Vigg, Avanti, MD   1 year ago Acute non-recurrent maxillary sinusitis   [place] [facility] Benson, Megan P, DO   1 year ago Acute conjunctivitis of both eyes, unspecified acute conjunctivitis type   [place] [facility] Loura Pardon, MD       Future Appointments             In 1 month Deirdre Evener, MD [facility]   In 2 months Dorcas Carrow, DO [place] [facility]

## 2022-10-19 DIAGNOSIS — H43813 Vitreous degeneration, bilateral: Secondary | ICD-10-CM | POA: Diagnosis not present

## 2022-10-19 DIAGNOSIS — M3501 Sicca syndrome with keratoconjunctivitis: Secondary | ICD-10-CM | POA: Diagnosis not present

## 2022-10-19 DIAGNOSIS — H02889 Meibomian gland dysfunction of unspecified eye, unspecified eyelid: Secondary | ICD-10-CM | POA: Diagnosis not present

## 2022-11-16 ENCOUNTER — Ambulatory Visit: Payer: BC Managed Care – PPO | Admitting: Dermatology

## 2022-11-16 ENCOUNTER — Encounter: Payer: Self-pay | Admitting: Dermatology

## 2022-11-16 DIAGNOSIS — Z5111 Encounter for antineoplastic chemotherapy: Secondary | ICD-10-CM

## 2022-11-16 DIAGNOSIS — L570 Actinic keratosis: Secondary | ICD-10-CM | POA: Diagnosis not present

## 2022-11-16 DIAGNOSIS — D1801 Hemangioma of skin and subcutaneous tissue: Secondary | ICD-10-CM

## 2022-11-16 DIAGNOSIS — L820 Inflamed seborrheic keratosis: Secondary | ICD-10-CM

## 2022-11-16 DIAGNOSIS — B353 Tinea pedis: Secondary | ICD-10-CM

## 2022-11-16 DIAGNOSIS — L821 Other seborrheic keratosis: Secondary | ICD-10-CM

## 2022-11-16 DIAGNOSIS — D229 Melanocytic nevi, unspecified: Secondary | ICD-10-CM

## 2022-11-16 DIAGNOSIS — L578 Other skin changes due to chronic exposure to nonionizing radiation: Secondary | ICD-10-CM | POA: Diagnosis not present

## 2022-11-16 DIAGNOSIS — L814 Other melanin hyperpigmentation: Secondary | ICD-10-CM

## 2022-11-16 DIAGNOSIS — W908XXA Exposure to other nonionizing radiation, initial encounter: Secondary | ICD-10-CM | POA: Diagnosis not present

## 2022-11-16 DIAGNOSIS — B351 Tinea unguium: Secondary | ICD-10-CM

## 2022-11-16 DIAGNOSIS — Z79899 Other long term (current) drug therapy: Secondary | ICD-10-CM

## 2022-11-16 DIAGNOSIS — Z7189 Other specified counseling: Secondary | ICD-10-CM

## 2022-11-16 DIAGNOSIS — Z1283 Encounter for screening for malignant neoplasm of skin: Secondary | ICD-10-CM

## 2022-11-16 MED ORDER — KETOCONAZOLE 2 % EX CREA
TOPICAL_CREAM | CUTANEOUS | 6 refills | Status: DC
Start: 2022-11-16 — End: 2023-11-21

## 2022-11-16 MED ORDER — FLUOROURACIL 5 % EX CREA: TOPICAL_CREAM | CUTANEOUS | 2 refills | Status: AC

## 2022-11-16 NOTE — Patient Instructions (Addendum)
Apply 5-fluorouracil/calcipotriene cream twice a day for 10 days to affected areas including scalp and temples. Prescription sent to [facility]. Patient advised they will receive an email to purchase the medication online and have it sent to their home. Patient provided with handout reviewing treatment course and side effects and advised to call or message [place] on MyChart with any concerns.    Instructions for [facility] Medications  One or more of your medications was sent to the [facility] mail order [facility] pharmacy. You will receive an email from them and can purchase the medicine through that link. It will then be mailed to your home at the address you confirmed. If for any reason you do not receive an email from them, please check your spam folder. If you still do not find the email, please let us know. [facility] phone number is [phone].    Continue Ketoconazole 2% cream at bedtime to feet  Cryotherapy Aftercare  Wash gently with soap and water everyday.   Apply Vaseline Jelly daily until healed.    Recommend daily broad spectrum sunscreen SPF 30+ to sun-exposed areas, reapply every 2 hours as needed. Call for new or changing lesions.  Staying in the shade or wearing long sleeves, sun glasses (UVA+UVB protection) and wide brim hats (4-inch brim around the entire circumference of the hat) are also recommended for sun protection.     Melanoma ABCDEs  Melanoma is the most dangerous type of skin cancer, and is the leading cause of death from skin disease.  You are more likely to develop melanoma if you: Have light-colored skin, light-colored eyes, or red or blond hair Spend a lot of time in the sun Tan regularly, either outdoors or in a tanning bed Have had blistering sunburns, especially during childhood Have a close family member who has had a melanoma Have atypical moles or large birthmarks  Early detection of melanoma is key since  treatment is typically straightforward and cure rates are extremely high if we catch it early.   The first sign of melanoma is often a change in a mole or a new dark spot.  The ABCDE system is a way of remembering the signs of melanoma.  A for asymmetry:  The two halves do not match. B for border:  The edges of the growth are irregular. C for color:  A mixture of colors are present instead of an even brown color. D for diameter:  Melanomas are usually (but not always) greater than 6mm - the size of a pencil eraser. E for evolution:  The spot keeps changing in size, shape, and color.  Please check your skin once per month between visits. You can use a small mirror in front and a large mirror behind you to keep an eye on the back side or your body.   If you see any new or changing lesions before your next follow-up, please call to schedule a visit.  Please continue daily skin protection including broad spectrum sunscreen SPF 30+ to sun-exposed areas, reapplying every 2 hours as needed when you're outdoors.   Staying in the shade or wearing long sleeves, sun glasses (UVA+UVB protection) and wide brim hats (4-inch brim around the entire circumference of the hat) are also recommended for sun protection.       Due to recent changes in healthcare laws, you may see results of your pathology and/or laboratory studies on MyChart before the doctors have had a chance to review them. We  understand that in some cases there may be results that are confusing or concerning to you. Please understand that not all results are received at the same time and often the doctors may need to interpret multiple results in order to provide you with the best plan of care or course of treatment. Therefore, we ask that you please give [place] 2 business days to thoroughly review all your results before contacting the office for clarification. Should we see a critical lab result, you will be contacted sooner.   If You Need  Anything After Your Visit  If you have any questions or concerns for your doctor, please call our main line at [phone] and press option 4 to reach your doctor's medical assistant. If no one answers, please leave a voicemail as directed and we will return your call as soon as possible. Messages left after 4 pm will be answered the following business day.   You may also send [place] a message via MyChart. We typically respond to MyChart messages within 1-2 business days.  For prescription refills, please ask your pharmacy to contact our office. Our fax number is [phone].  If you have an urgent issue when the clinic is closed that cannot wait until the next business day, you can page your doctor at the number below.    Please note that while we do our best to be available for urgent issues outside of office hours, we are not available 24/7.   If you have an urgent issue and are unable to reach [place], you may choose to seek medical care at your doctor's office, retail clinic, urgent care center, or emergency room.  If you have a medical emergency, please immediately call 911 or go to the emergency department.  Pager Numbers  - Dr. Gwen Pounds: [phone]  - Dr. Roseanne Reno: [phone]  - Dr. Katrinka Blazing: [phone]   In the event of inclement weather, please call our main line at [phone] for an update on the status of any delays or closures.  Dermatology Medication Tips: Please keep the boxes that topical medications come in in order to help keep track of the instructions about where and how to use these. Pharmacies typically print the medication instructions only on the boxes and not directly on the medication tubes.   If your medication is too expensive, please contact our office at [phone] option 4 or send [place] a message through MyChart.   We are unable to tell what your co-pay for medications will be in advance as this is different depending on your insurance coverage. However, we  may be able to find a substitute medication at lower cost or fill out paperwork to get insurance to cover a needed medication.   If a prior authorization is required to get your medication covered by your insurance company, please allow [place] 1-2 business days to complete this process.  Drug prices often vary depending on where the prescription is filled and some pharmacies may offer cheaper prices.  The website www.goodrx.com contains coupons for medications through different pharmacies. The prices here do not account for what the cost may be with help from insurance (it may be cheaper with your insurance), but the website can give you the price if you did not use any insurance.  - You can print the associated coupon and take it with your prescription to the pharmacy.  - You may also stop by our office during regular business hours and pick up a GoodRx coupon card.  - If  you need your prescription sent electronically to a different pharmacy, notify our office through [facility] or by phone at [phone] option 4.     Si Usted Necesita Algo Despus de Su Visita  Tambin puede enviarnos un mensaje a travs de Clinical cytogeneticist. Por lo general respondemos a los mensajes de MyChart en el transcurso de 1 a 2 das hbiles.  Para renovar recetas, por favor pida a su farmacia que se ponga en contacto con nuestra oficina. Annie Sable de fax es [place] [phone].  Si tiene un asunto urgente cuando la clnica est cerrada y que no puede esperar hasta el siguiente da hbil, puede llamar/localizar a su doctor(a) al nmero que aparece a continuacin.   Por favor, tenga en cuenta que aunque hacemos todo lo posible para estar disponibles para asuntos urgentes fuera del horario de [place], no estamos disponibles las 24 horas del da, los 7 [address]  [address] de la [place].   Si tiene un problema urgente y no puede comunicarse con nosotros, puede optar por buscar atencin mdica  en el consultorio de su doctor(a), en una  clnica privada, en un centro de atencin urgente o en una sala de emergencias.  Si tiene Engineer, drilling, por favor llame inmediatamente al 911 o vaya a la sala de emergencias.  Nmeros de bper  - Dr. Gwen Pounds: [phone]  - Dra. Roseanne Reno: [phone]  - Dr. Katrinka Blazing: [phone]   En caso de inclemencias del tiempo, por favor llame a Lacy Duverney principal al [phone] para una actualizacin sobre el [place] de cualquier retraso o cierre.  Consejos para la medicacin en dermatologa: Por favor, guarde las cajas en las que vienen los medicamentos de uso tpico para ayudarle a seguir las instrucciones sobre dnde y cmo usarlos. Las farmacias generalmente imprimen las instrucciones del medicamento slo en las cajas y no directamente en los tubos del [place].   Si su medicamento es muy caro, por favor, pngase en contacto con Rolm Gala llamando al [phone] y presione la opcin 4 o envenos un mensaje a travs de Clinical cytogeneticist.   No podemos decirle cul ser su copago por los medicamentos por adelantado ya que esto es diferente dependiendo de la cobertura de su seguro. Sin embargo, es posible que podamos encontrar un medicamento sustituto a Audiological scientist un formulario para que el seguro cubra el medicamento que se considera necesario.   Si se requiere una autorizacin previa para que su compaa de seguros [place] su medicamento, por favor permtanos de 1 a 2 das hbiles para completar [address]  Los precios de los medicamentos varan con frecuencia dependiendo del Environmental consultant de dnde se surte la receta y alguna farmacias pueden ofrecer precios ms baratos.  El sitio web www.goodrx.com tiene cupones para medicamentos de Health and safety inspector. Los precios aqu no tienen en cuenta lo que podra costar con la ayuda del seguro (puede ser ms barato con su seguro), pero el sitio web puede darle el precio si no utiliz Tourist information centre manager.  - Puede imprimir el cupn correspondiente  y llevarlo con su receta a la farmacia.  - Tambin puede pasar por nuestra oficina durante el horario de atencin regular y Education officer, museum una tarjeta de cupones de GoodRx.  - Si necesita que su receta se enve electrnicamente a una farmacia diferente, informe a nuestra oficina a travs de MyChart de [place] o por telfono llamando al [phone] y presione la opcin 4.

## 2022-11-16 NOTE — Progress Notes (Signed)
Follow-Up Visit   Subjective  Logan Willis is a 77 y.o. male who presents for the following: Skin Cancer Screening and Full Body Skin Exam  The patient presents for Total-Body Skin Exam (TBSE) for skin cancer screening and mole check. The patient has spots, moles and lesions to be evaluated, some may be new or changing and the patient may have concern these could be cancer.    The following portions of the chart were reviewed this encounter and updated as appropriate: medications, allergies, medical history  Review of Systems:  No other skin or systemic complaints except as noted in HPI or Assessment and Plan.  Objective  Well appearing patient in no apparent distress; mood and affect are within normal limits.  A full examination was performed including scalp, head, eyes, ears, nose, lips, neck, chest, axillae, abdomen, back, buttocks, bilateral upper extremities, bilateral lower extremities, hands, feet, fingers, toes, fingernails, and toenails. All findings within normal limits unless otherwise noted below.   Relevant physical exam findings are noted in the Assessment and Plan.  R forehead x1, R post neck x1 Erythematous keratotic or waxy stuck-on papule or plaque.    Assessment & Plan   SKIN CANCER SCREENING PERFORMED TODAY.  ACTINIC DAMAGE WITH PRECANCEROUS ACTINIC KERATOSES Counseling for Topical Chemotherapy Management: Patient exhibits: - Severe, confluent actinic changes with pre-cancerous actinic keratoses that is secondary to cumulative UV radiation exposure over time - Condition that is severe; chronic, not at goal. - diffuse scaly erythematous macules and papules with underlying dyspigmentation - Discussed Prescription "Field Treatment" topical Chemotherapy for Severe, Chronic Confluent Actinic Changes with Pre-Cancerous Actinic Keratoses Field treatment involves treatment of an entire area of skin that has confluent Actinic Changes (Sun/ Ultraviolet light  damage) and PreCancerous Actinic Keratoses by method of PhotoDynamic Therapy (PDT) and/or prescription Topical Chemotherapy agents such as 5-fluorouracil, 5-fluorouracil/calcipotriene, and/or imiquimod.  The purpose is to decrease the number of clinically evident and subclinical PreCancerous lesions to prevent progression to development of skin cancer by chemically destroying early precancer changes that may or may not be visible.  It has been shown to reduce the risk of developing skin cancer in the treated area. As a result of treatment, redness, scaling, crusting, and open sores may occur during treatment course. One or more than one of these methods may be used and may have to be used several times to control, suppress and eliminate the PreCancerous changes. Discussed treatment course, expected reaction, and possible side effects. - Recommend daily broad spectrum sunscreen SPF 30+ to sun-exposed areas, reapply every 2 hours as needed.  - Staying in the shade or wearing long sleeves, sun glasses (UVA+UVB protection) and wide brim hats (4-inch brim around the entire circumference of the hat) are also recommended. - Call for new or changing lesions.  Apply 5-fluorouracil/calcipotriene cream twice a day for 10 days to affected areas including scalp and temples. Prescription sent to [facility]. Patient advised they will receive an email to purchase the medication online and have it sent to their home. Patient provided with handout reviewing treatment course and side effects and advised to call or message [place] on MyChart with any concerns.   LENTIGINES, SEBORRHEIC KERATOSES, HEMANGIOMAS - Benign normal skin lesions - Benign-appearing - Call for any changes  MELANOCYTIC NEVI - Tan-brown and/or pink-flesh-colored symmetric macules and papules - Benign appearing on exam today - Observation - Call clinic for new or changing moles - Recommend daily use of broad spectrum spf 30+  sunscreen to sun-exposed areas.    TINEA PEDIS Exam: Scaling and maceration web spaces and over distal and lateral soles. Chronic and persistent condition with duration or expected duration over one year. Condition is symptomatic / bothersome to patient. Not to goal.  Treatment Plan: Continue Ketoconazole 2% cream at bedtime to feet.    Patient defers oral medication today and prefers to use topical treatment instead.     Inflamed seborrheic keratosis R forehead x1, R post neck x1  Symptomatic, irritating, patient would like treated.  Destruction of lesion - R forehead x1, R post neck x1 Complexity: simple   Destruction method: cryotherapy   Informed consent: discussed and consent obtained   Timeout:  patient name, date of birth, surgical site, and procedure verified Lesion destroyed using liquid nitrogen: Yes   Region frozen until ice ball extended beyond lesion: Yes   Outcome: patient tolerated procedure well with no complications   Post-procedure details: wound care instructions given    Tinea unguium  Related Medications ketoconazole (NIZORAL) 2 % cream Apply to feet QHS   Return in about 1 year (around 11/16/2023) for TBSE.  I, Lawson Radar, CMA, am acting as scribe for Armida Sans, MD.   Documentation: I have reviewed the above documentation for accuracy and completeness, and I agree with the above.  Armida Sans, MD

## 2022-12-11 ENCOUNTER — Ambulatory Visit: Payer: BC Managed Care – PPO | Admitting: Family Medicine

## 2022-12-19 ENCOUNTER — Encounter: Payer: Self-pay | Admitting: Family Medicine

## 2022-12-19 ENCOUNTER — Ambulatory Visit: Payer: BC Managed Care – PPO | Admitting: Family Medicine

## 2022-12-19 VITALS — BP 114/71 | HR 76 | Ht 69.0 in | Wt 237.4 lb

## 2022-12-19 DIAGNOSIS — N401 Enlarged prostate with lower urinary tract symptoms: Secondary | ICD-10-CM | POA: Diagnosis not present

## 2022-12-19 DIAGNOSIS — N138 Other obstructive and reflux uropathy: Secondary | ICD-10-CM

## 2022-12-19 DIAGNOSIS — N183 Chronic kidney disease, stage 3 unspecified: Secondary | ICD-10-CM | POA: Diagnosis not present

## 2022-12-19 DIAGNOSIS — E782 Mixed hyperlipidemia: Secondary | ICD-10-CM | POA: Diagnosis not present

## 2022-12-19 DIAGNOSIS — I10 Essential (primary) hypertension: Secondary | ICD-10-CM | POA: Diagnosis not present

## 2022-12-19 MED ORDER — ROSUVASTATIN CALCIUM 20 MG PO TABS: 20.0000 mg | ORAL_TABLET | Freq: Every day | ORAL | 1 refills | Status: DC

## 2022-12-19 MED ORDER — TAMSULOSIN HCL 0.4 MG PO CAPS: 0.4000 mg | ORAL_CAPSULE | Freq: Every day | ORAL | 1 refills | Status: DC

## 2022-12-19 MED ORDER — FLUTICASONE PROPIONATE 50 MCG/ACT NA SUSP: 2.0000 | Freq: Every day | NASAL | 6 refills | Status: DC

## 2022-12-19 MED ORDER — BENAZEPRIL HCL 10 MG PO TABS: 10.0000 mg | ORAL_TABLET | Freq: Every day | ORAL | 1 refills | Status: DC

## 2022-12-19 NOTE — Assessment & Plan Note (Signed)
Under good control on current regimen. Continue current regimen. Continue to monitor. Call with any concerns. Refills given. Labs drawn today.   

## 2022-12-19 NOTE — Assessment & Plan Note (Signed)
Under good control on current regimen. Continue current regimen. Continue to monitor. Call with any concerns. Refills given.   

## 2022-12-19 NOTE — Progress Notes (Signed)
BP 114/71   Pulse 76   Ht 5\' 9"  (1.753 m)   Wt 237 lb 6.4 oz (107.7 kg)   SpO2 96%   BMI 35.06 kg/m    Subjective:    Patient ID: Logan Willis, male    DOB: [birth year], 77 y.o.   MRN: [id number]  HPI: Logan Willis is a 77 y.o. male  Chief Complaint  Patient presents with   Benign Prostatic Hypertrophy   Hypertension   HYPERTENSION / HYPERLIPIDEMIA Satisfied with current treatment? yes Duration of hypertension: chronic BP monitoring frequency: not checking BP medication side effects: no Past BP meds: benazepril Duration of hyperlipidemia: chronic Cholesterol medication side effects: no Cholesterol supplements: none, niacin, and red yeast rice Past cholesterol medications: crestor Medication compliance: excellent compliance Aspirin: no Recent stressors: no Recurrent headaches: no Visual changes: no Palpitations: no Dyspnea: no Chest pain: no Lower extremity edema: no Dizzy/lightheaded: no  BPH BPH status: stable Satisfied with current treatment?: yes Medication side effects: no Medication compliance: excellent compliance Duration: chronic Nocturia: 2-3x per night Urinary frequency:no Incomplete voiding: no Urgency: no Weak urinary stream: no Straining to start stream: no Dysuria: no Onset: gradual Severity: mild  Relevant past medical, surgical, family and social history reviewed and updated as indicated. Interim medical history since our last visit reviewed. Allergies and medications reviewed and updated.  Review of Systems  Constitutional: Negative.   Respiratory: Negative.    Cardiovascular: Negative.   Gastrointestinal: Negative.   Musculoskeletal: Negative.   Psychiatric/Behavioral: Negative.      Per HPI unless specifically indicated above     Objective:    BP 114/71   Pulse 76   Ht 5\' 9"  (1.753 m)   Wt 237 lb 6.4 oz (107.7 kg)   SpO2 96%   BMI 35.06 kg/m   Wt Readings from Last 3 Encounters:  12/19/22 237 lb 6.4 oz (107.7  kg)  06/06/22 235 lb (106.6 kg)  05/03/22 235 lb (106.6 kg)    Physical Exam Vitals and nursing note reviewed.  Constitutional:      General: He is not in acute distress.    Appearance: Normal appearance. He is obese. He is not ill-appearing, toxic-appearing or diaphoretic.  HENT:     Head: Normocephalic and atraumatic.     Right Ear: External ear normal.     Left Ear: External ear normal.     Nose: Nose normal.     Mouth/Throat:     Mouth: Mucous membranes are moist.     Pharynx: Oropharynx is clear.  Eyes:     General: No scleral icterus.       Right eye: No discharge.        Left eye: No discharge.     Extraocular Movements: Extraocular movements intact.     Conjunctiva/sclera: Conjunctivae normal.     Pupils: Pupils are equal, round, and reactive to light.  Cardiovascular:     Rate and Rhythm: Normal rate and regular rhythm.     Pulses: Normal pulses.     Heart sounds: Normal heart sounds. No murmur heard.    No friction rub. No gallop.  Pulmonary:     Effort: Pulmonary effort is normal. No respiratory distress.     Breath sounds: Normal breath sounds. No stridor. No wheezing, rhonchi or rales.  Chest:     Chest wall: No tenderness.  Musculoskeletal:        General: Normal range of motion.     Cervical back: Normal range of  motion and neck supple.  Skin:    General: Skin is warm and dry.     Capillary Refill: Capillary refill takes less than 2 seconds.     Coloration: Skin is not jaundiced or pale.     Findings: No bruising, erythema, lesion or rash.  Neurological:     General: No focal deficit present.     Mental Status: He is alert and oriented to person, place, and time. Mental status is at baseline.  Psychiatric:        Mood and Affect: Mood normal.        Behavior: Behavior normal.        Thought Content: Thought content normal.        Judgment: Judgment normal.     Results for orders placed or performed in visit on 06/06/22  Comprehensive metabolic  panel  Result Value Ref Range   Glucose 87 70 - 99 mg/dL   BUN 20 8 - 27 mg/dL   Creatinine, Ser 1.61 0.76 - 1.27 mg/dL   eGFR 70 >09 UE/AVW/0.98   BUN/Creatinine Ratio 18 10 - 24   Sodium 143 134 - 144 mmol/L   Potassium 4.6 3.5 - 5.2 mmol/L   Chloride 107 (H) 96 - 106 mmol/L   CO2 22 20 - 29 mmol/L   Calcium 9.1 8.6 - 10.2 mg/dL   Total Protein 5.9 (L) 6.0 - 8.5 g/dL   Albumin 4.3 3.8 - 4.8 g/dL   Globulin, Total 1.6 1.5 - 4.5 g/dL   Albumin/Globulin Ratio 2.7 (H) 1.2 - 2.2   Bilirubin Total 0.4 0.0 - 1.2 mg/dL   Alkaline Phosphatase 57 44 - 121 IU/L   AST 12 0 - 40 IU/L   ALT 21 0 - 44 IU/L  CBC with Differential/Platelet  Result Value Ref Range   WBC 4.5 3.4 - 10.8 x10E3/uL   RBC 4.94 4.14 - 5.80 x10E6/uL   Hemoglobin 14.9 13.0 - 17.7 g/dL   Hematocrit 11.9 14.7 - 51.0 %   MCV 91 79 - 97 fL   MCH 30.2 26.6 - 33.0 pg   MCHC 33.3 31.5 - 35.7 g/dL   RDW 82.9 56.2 - 13.0 %   Platelets 137 (L) 150 - 450 x10E3/uL   Neutrophils 67 Not Estab. %   Lymphs 22 Not Estab. %   Monocytes 9 Not Estab. %   Eos 2 Not Estab. %   Basos 0 Not Estab. %   Neutrophils Absolute 3.0 1.4 - 7.0 x10E3/uL   Lymphocytes Absolute 1.0 0.7 - 3.1 x10E3/uL   Monocytes Absolute 0.4 0.1 - 0.9 x10E3/uL   EOS (ABSOLUTE) 0.1 0.0 - 0.4 x10E3/uL   Basophils Absolute 0.0 0.0 - 0.2 x10E3/uL   Immature Granulocytes 0 Not Estab. %   Immature Grans (Abs) 0.0 0.0 - 0.1 x10E3/uL  Lipid Panel w/o Chol/HDL Ratio  Result Value Ref Range   Cholesterol, Total 106 100 - 199 mg/dL   Triglycerides 73 0 - 149 mg/dL   HDL 44 >86 mg/dL   VLDL Cholesterol Cal 15 5 - 40 mg/dL   LDL Chol Calc (NIH) 47 0 - 99 mg/dL  PSA  Result Value Ref Range   Prostate Specific Ag, Serum 3.3 0.0 - 4.0 ng/mL  TSH  Result Value Ref Range   TSH 0.919 0.450 - 4.500 uIU/mL  Urinalysis, Routine w reflex microscopic  Result Value Ref Range   Specific Gravity, UA 1.025 1.005 - 1.030   pH, UA 5.5 5.0 - 7.5  Color, UA Yellow Yellow    Appearance Ur Clear Clear   Leukocytes,UA Negative Negative   Protein,UA Trace (A) Negative/Trace   Glucose, UA Negative Negative   Ketones, UA Trace (A) Negative   RBC, UA Negative Negative   Bilirubin, UA Negative Negative   Urobilinogen, Ur 1.0 0.2 - 1.0 mg/dL   Nitrite, UA Negative Negative   Microscopic Examination Comment   Microalbumin, Urine Waived  Result Value Ref Range   Microalb, Ur Waived 80 (H) 0 - 19 mg/L   Creatinine, Urine Waived 100 10 - 300 mg/dL   Microalb/Creat Ratio 30-300 (H) <30 mg/g      Assessment & Plan:   Problem List Items Addressed This Visit       Cardiovascular and Mediastinum   Essential hypertension    Under good control on current regimen. Continue current regimen. Continue to monitor. Call with any concerns. Refills given. Labs drawn today.        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 Continue current regimen. Continue to monitor. Call with any concerns. Refills given.        Relevant Medications   tamsulosin (FLOMAX) 0.4 MG CAPS capsule   CKD (chronic kidney disease) stage 3, GFR 30-59 ml/min (HCC)    Rechecking labs today. Await results. Treat as needed.         Other   Mixed hyperlipidemia    Under good control on current regimen. Continue current regimen. Continue to monitor. Call with any concerns. Refills given. Labs drawn today.       Relevant Medications   benazepril (LOTENSIN) 10 MG tablet   rosuvastatin (CRESTOR) 20 MG tablet   Other Relevant Orders   Lipid Panel w/o Chol/HDL Ratio   Comprehensive metabolic panel     Follow up plan: Return in about 6 months (around 06/19/2023) for physical.

## 2022-12-19 NOTE — Assessment & Plan Note (Signed)
Rechecking labs today. Await results. Treat as needed.  °

## 2022-12-20 LAB — COMPREHENSIVE METABOLIC PANEL
ALT: 26 [IU]/L (ref 0–44)
AST: 18 [IU]/L (ref 0–40)
Albumin: 4.5 g/dL (ref 3.8–4.8)
Alkaline Phosphatase: 59 [IU]/L (ref 44–121)
BUN/Creatinine Ratio: 14 (ref 10–24)
BUN: 16 mg/dL (ref 8–27)
Bilirubin Total: 0.4 mg/dL (ref 0.0–1.2)
CO2: 22 mmol/L (ref 20–29)
Calcium: 9.3 mg/dL (ref 8.6–10.2)
Chloride: 106 mmol/L (ref 96–106)
Creatinine, Ser: 1.13 mg/dL (ref 0.76–1.27)
Globulin, Total: 1.9 g/dL (ref 1.5–4.5)
Glucose: 82 mg/dL (ref 70–99)
Potassium: 4.3 mmol/L (ref 3.5–5.2)
Sodium: 142 mmol/L (ref 134–144)
Total Protein: 6.4 g/dL (ref 6.0–8.5)
eGFR: 68 mL/min/{1.73_m2} (ref >59)

## 2022-12-20 LAB — LIPID PANEL W/O CHOL/HDL RATIO
Cholesterol, Total: 108 mg/dL (ref 100–199)
HDL: 46 mg/dL (ref >39)
LDL Chol Calc (NIH): 46 mg/dL (ref 0–99)
Triglycerides: 76 mg/dL (ref 0–149)
VLDL Cholesterol Cal: 16 mg/dL (ref 5–40)

## 2023-03-17 DIAGNOSIS — R0981 Nasal congestion: Secondary | ICD-10-CM | POA: Diagnosis not present

## 2023-03-17 DIAGNOSIS — J101 Influenza due to other identified influenza virus with other respiratory manifestations: Secondary | ICD-10-CM | POA: Diagnosis not present

## 2023-03-17 DIAGNOSIS — R051 Acute cough: Secondary | ICD-10-CM | POA: Diagnosis not present

## 2023-03-17 DIAGNOSIS — J3489 Other specified disorders of nose and nasal sinuses: Secondary | ICD-10-CM | POA: Diagnosis not present

## 2023-04-10 ENCOUNTER — Ambulatory Visit (INDEPENDENT_AMBULATORY_CARE_PROVIDER_SITE_OTHER): Payer: Self-pay | Admitting: Emergency Medicine

## 2023-04-10 VITALS — Ht 70.0 in | Wt 235.0 lb

## 2023-04-10 DIAGNOSIS — Z0000 Encounter for general adult medical examination without abnormal findings: Secondary | ICD-10-CM

## 2023-04-10 NOTE — Progress Notes (Signed)
 Subjective:   Logan Willis is a 78 y.o. who presents for a Medicare Wellness preventive visit.  Visit Complete: Virtual I connected with  Freddy Jaksch on 04/10/23 by a audio enabled telemedicine application and verified that I am speaking with the correct person using two identifiers.  Patient Location: Home  Provider Location: Office/Clinic  I discussed the limitations of evaluation and management by telemedicine. The patient expressed understanding and agreed to proceed.  Vital Signs: Because this visit was a virtual/telehealth visit, some criteria may be missing or patient reported. Any vitals not documented were not able to be obtained and vitals that have been documented are patient reported.  VideoDeclined- This patient declined Librarian, academic. Therefore the visit was completed with audio only.  Persons Participating in Visit: Patient.  AWV Questionnaire: No: Patient Medicare AWV questionnaire was not completed prior to this visit.  Cardiac Risk Factors include: advanced age (>75men, >28 women);male gender;hypertension;dyslipidemia;obesity (BMI >30kg/m2)     Objective:    Today's Vitals   04/10/23 0959  Weight: 235 lb (106.6 kg)  Height: 5\' 10"  (1.778 m)   Body mass index is 33.72 kg/m.     04/10/2023   10:07 AM 04/04/2022   11:05 AM 03/24/2021    9:51 AM 04/29/2019    8:49 AM 08/22/2018    8:20 AM 08/19/2018   10:26 AM 08/19/2018    5:24 AM  Advanced Directives  Does Patient Have a Medical Advance Directive? No No No No No No No  Would patient like information on creating a medical advance directive? No - Patient declined No - Patient declined No - Patient declined  No - Patient declined No - Patient declined No - Patient declined    Current Medications (verified) Outpatient Encounter Medications as of 04/10/2023  Medication Sig   benazepril (LOTENSIN) 10 MG tablet Take 1 tablet (10 mg total) by mouth daily.    fexofenadine-pseudoephedrine (ALLEGRA-D 24) 180-240 MG 24 hr tablet Take 1 tablet by mouth daily as needed.   fluorouracil (EFUDEX) 5 % cream Apply twice daily for 10 days to scalp and temples   fluticasone (FLONASE) 50 MCG/ACT nasal spray Place 2 sprays into both nostrils daily.   ketoconazole (NIZORAL) 2 % cream Apply to feet QHS   rosuvastatin (CRESTOR) 20 MG tablet Take 1 tablet (20 mg total) by mouth daily.   tamsulosin (FLOMAX) 0.4 MG CAPS capsule Take 1 capsule (0.4 mg total) by mouth daily.   valACYclovir (VALTREX) 500 MG tablet Take 2 tablets (1,000 mg total) by mouth 2 (two) times daily.   No facility-administered encounter medications on file as of 04/10/2023.    Allergies (verified) Patient has no known allergies.   History: Past Medical History:  Diagnosis Date   Actinic keratosis    Body mass index 35.0-35.9, adult    Cold sore    Hypertension    Kidney stone 08/2018   Past Surgical History:  Procedure Laterality Date   COLON SURGERY     colectomy 2008   COLONOSCOPY     COLONOSCOPY WITH PROPOFOL N/A 12/10/2014   Procedure: COLONOSCOPY WITH PROPOFOL;  Surgeon: Midge Minium, MD;  Location: [facility];  Service: Endoscopy;  Laterality: N/A;   COLONOSCOPY WITH PROPOFOL N/A 04/29/2019   Procedure: COLONOSCOPY WITH PROPOFOL;  Surgeon: Midge Minium, MD;  Location: [facility];  Service: Endoscopy;  Laterality: N/A;   EXTRACORPOREAL SHOCK WAVE LITHOTRIPSY Left 08/22/2018   Procedure: EXTRACORPOREAL SHOCK WAVE LITHOTRIPSY (ESWL);  Surgeon: Richardo Hanks,  Laurette Schimke, MD;  Location: [facility];  Service: Urology;  Laterality: Left;   HERNIA REPAIR     Family History  Problem Relation Age of Onset   Heart disease Mother 5   Heart disease Father 47   Diabetes Sister    Stroke Sister    Cancer Neg Hx    COPD Neg Hx    Social History   Socioeconomic History   Marital status: Married    Spouse name: Vickie   Number of children: 1   Years of education: Not on file    Highest education level: Not on file  Occupational History   Not on file  Tobacco Use   Smoking status: Never    Passive exposure: Never   Smokeless tobacco: Never  Vaping Use   Vaping status: Never Used  Substance and Sexual Activity   Alcohol use: No   Drug use: No   Sexual activity: Yes    Birth control/protection: None  Other Topics Concern   Not on file  Social History Narrative   04/10/23 working full time   Social Drivers of Health   Financial Resource Strain: Low Risk  (04/10/2023)   Overall Financial Resource Strain (CARDIA)    Difficulty of Paying Living Expenses: Not hard at all  Food Insecurity: No Food Insecurity (04/10/2023)   Hunger Vital Sign    Worried About Running Out of Food in the Last Year: Never true    Ran Out of Food in the Last Year: Never true  Transportation Needs: No Transportation Needs (04/10/2023)   PRAPARE - Administrator, Civil Service (Medical): No    Lack of Transportation (Non-Medical): No  Physical Activity: Inactive (04/10/2023)   Exercise Vital Sign    Days of Exercise per Week: 0 days    Minutes of Exercise per Session: 0 min  Stress: No Stress Concern Present (04/10/2023)   [employer] of Occupational Health - Occupational Stress Questionnaire    Feeling of Stress : Not at all  Social Connections: Socially Integrated (04/10/2023)   Social Connection and Isolation Panel [NHANES]    Frequency of Communication with Friends and Family: More than three times a week    Frequency of Social Gatherings with Friends and Family: Once a week    Attends Religious Services: More than 4 times per year    Active Member of [employer] or Organizations: Yes    Attends Engineer, structural: More than 4 times per year    Marital Status: Married    Tobacco Counseling Counseling given: Not Answered    Clinical Intake:  Pre-visit preparation completed: Yes  Pain : No/denies pain     BMI - recorded: 33.72 Nutritional Status: BMI  > 30  Obese Nutritional Risks: None Diabetes: No  Lab Results  Component Value Date   HGBA1C 5.2 05/16/2021     How often do you need to have someone help you when you read instructions, pamphlets, or other written materials from your doctor or pharmacy?: 1 - Never  Interpreter Needed?: No  Information entered by :: Tora Kindred, CMA   Activities of Daily Living     04/10/2023   10:01 AM  In your present state of health, do you have any difficulty performing the following activities:  Hearing? 0  Vision? 0  Difficulty concentrating or making decisions? 0  Walking or climbing stairs? 0  Dressing or bathing? 0  Doing errands, shopping? 0  Preparing Food and eating ? N  Using the Toilet? N  In the past six months, have you accidently leaked urine? N  Do you have problems with loss of bowel control? N  Managing your Medications? N  Managing your Finances? N  Housekeeping or managing your Housekeeping? N    Patient Care Team: Dorcas Carrow, DO as PCP - General (Family Medicine) [facility], [facility] (Optometry) Deirdre Evener, MD (Dermatology) Sondra Come, MD as Consulting Physician (Urology) Midge Minium, MD as Consulting Physician (Gastroenterology)  Indicate any recent Medical Services you may have received from other than [facility] providers in the past year (date may be approximate).     Assessment:   This is a routine wellness examination for Khani.  Hearing/Vision screen Hearing Screening - Comments:: Denies hearing loss Vision Screening - Comments:: Gets routine eye exams, [facility] [place], [place]   Goals Addressed             This Visit's Progress    Patient Stated       Lose 10 lbs       Depression Screen     04/10/2023   10:05 AM 12/19/2022    1:41 PM 06/06/2022    8:14 AM 04/04/2022   11:04 AM 12/06/2021    9:49 AM 05/25/2021    8:24 AM 05/10/2021    9:39 AM  PHQ 2/9 Scores  PHQ - 2 Score 0 0 0 0 0 0 0  PHQ- 9 Score 0 0 0 0 1 0 2     Fall Risk     04/10/2023   10:08 AM 12/19/2022    1:41 PM 06/06/2022    8:14 AM 04/04/2022   11:06 AM 12/06/2021    9:49 AM  Fall Risk   Falls in the past year? 0 0 0 0 0  Number falls in past yr: 0 0 0 0 0  Injury with Fall? 0 0 0 0 0  Risk for fall due to : No Fall Risks No Fall Risks No Fall Risks No Fall Risks No Fall Risks  Follow up Falls prevention discussed;Falls evaluation completed Falls evaluation completed Falls evaluation completed Falls prevention discussed;Falls evaluation completed Falls evaluation completed    MEDICARE RISK AT HOME:  Medicare Risk at Home Any stairs in or around the home?: Yes If so, are there any without handrails?: No Home free of loose throw rugs in walkways, pet beds, electrical cords, etc?: Yes Adequate lighting in your home to reduce risk of falls?: Yes Life alert?: No Use of a cane, walker or w/c?: No Grab bars in the bathroom?: No Shower chair or bench in shower?: No Elevated toilet seat or a handicapped toilet?: No  TIMED UP AND GO:  Was the test performed?  No  Cognitive Function: 6CIT completed        04/10/2023   10:09 AM 04/04/2022   11:09 AM  6CIT Screen  What Year? 0 points 0 points  What month? 0 points 0 points  What time? 0 points 0 points  Count back from 20 0 points 0 points  Months in reverse 0 points 0 points  Repeat phrase 0 points 0 points  Total Score 0 points 0 points    Immunizations Immunization History  Administered Date(s) Administered   Fluad Quad(high Dose 65+) 09/24/2018, 10/14/2020, 10/03/2021   Influenza, High Dose Seasonal PF 09/26/2016, 10/02/2017, 09/27/2022   Influenza-Unspecified 09/24/2014, 10/10/2015, 10/10/2019   PFIZER(Purple Top)SARS-COV-2 Vaccination 03/06/2019, 04/01/2019, 12/31/2019   Pfizer Covid-19 Vaccine Bivalent Booster 13yrs &  up 10/27/2020   Pfizer(Comirnaty)Fall Seasonal Vaccine 12 years and older 10/21/2021, 10/02/2022   Pneumococcal Conjugate-13 05/05/2014    Pneumococcal Polysaccharide-23 07/31/2017   Pneumococcal-Unspecified 07/22/2012   Respiratory Syncytial Virus Vaccine,Recomb Aduvanted(Arexvy) 10/28/2021   Td 11/23/2005   Tdap 11/30/2015   Zoster Recombinant(Shingrix) 11/25/2020, 12/06/2021   Zoster, Live 07/27/2011    Screening Tests Health Maintenance  Topic Date Due   COVID-19 Vaccine (7 - Pfizer risk 2024-25 season) 09/10/2023 (Originally 04/01/2023)   INFLUENZA VACCINE  08/10/2023   Colonoscopy  04/28/2024   DTaP/Tdap/Td (3 - Td or Tdap) 11/29/2025   Pneumonia Vaccine 2+ Years old  Completed   Hepatitis C Screening  Completed   Zoster Vaccines- Shingrix  Completed   HPV VACCINES  Aged Out    Health Maintenance  There are no preventive care reminders to display for this patient. Health Maintenance Items Addressed: See Nurse Notes  Additional Screening:  Vision Screening: Recommended annual ophthalmology exams for early detection of glaucoma and other disorders of the eye.  Dental Screening: Recommended annual dental exams for proper oral hygiene  Community Resource Referral / Chronic Care Management: CRR required this visit?  No   CCM required this visit?  No     Plan:     I have personally reviewed and noted the following in the patient's chart:   Medical and social history Use of alcohol, tobacco or illicit drugs  Current medications and supplements including opioid prescriptions. Patient is not currently taking opioid prescriptions. Functional ability and status Nutritional status Physical activity Advanced directives List of other physicians Hospitalizations, surgeries, and ER visits in previous 12 months Vitals Screenings to include cognitive, depression, and falls Referrals and appointments  In addition, I have reviewed and discussed with patient certain preventive protocols, quality metrics, and best practice recommendations. A written personalized care plan for preventive services as well as  general preventive health recommendations were provided to patient.     Tora Kindred, CMA   04/10/2023   After Visit Summary: (MyChart) Due to this being a telephonic visit, the after visit summary with patients personalized plan was offered to patient via MyChart   Notes:  Nothing significant to report at this time.

## 2023-04-10 NOTE — Patient Instructions (Addendum)
 Mr. Logan Willis , Thank you for taking time to come for your Medicare Wellness Visit. I appreciate your ongoing commitment to your health goals. Please review the following plan we discussed and let me know if I can assist you in the future.   Referrals/Orders/Follow-Ups/Clinician Recommendations: Keep up the good work!  This is a list of the screening recommended for you and due dates:  Health Maintenance  Topic Date Due   COVID-19 Vaccine (7 - Pfizer risk 2024-25 season) 09/10/2023*   Flu Shot  08/10/2023   Colon Cancer Screening  04/28/2024   DTaP/Tdap/Td vaccine (3 - Td or Tdap) 11/29/2025   Pneumonia Vaccine  Completed   Hepatitis C Screening  Completed   Zoster (Shingles) Vaccine  Completed   HPV Vaccine  Aged Out  *Topic was postponed. The date shown is not the original due date.    Advanced directives: (Declined) Advance directive discussed with you today. Even though you declined this today, please call our office should you change your mind, and we can give you the proper paperwork for you to fill out.  Next Medicare Annual Wellness Visit scheduled for next year: Yes, 04/22/24 @ 8:00am (phone visit)

## 2023-06-19 ENCOUNTER — Ambulatory Visit: Payer: Self-pay | Admitting: Family Medicine

## 2023-06-23 ENCOUNTER — Other Ambulatory Visit: Payer: Self-pay | Admitting: Family Medicine

## 2023-06-25 NOTE — Telephone Encounter (Signed)
 Requested Prescriptions  Pending Prescriptions Disp Refills   rosuvastatin  (CRESTOR ) 20 MG tablet [Pharmacy Med Name: ROSUVASTATIN  CALCIUM  20 MG TAB] 90 tablet 0    Sig: TAKE 1 TABLET BY MOUTH EVERY DAY     Cardiovascular:  Antilipid - Statins 2 Failed - 06/25/2023 10:35 AM      Failed - Valid encounter within last 12 months    Recent Outpatient Visits   None     Future Appointments             In 2 weeks Solomon Dupre, DO [facility]   In 4 months Elta Halter, MD [facility]            Failed - Lipid Panel in normal range within the last 12 months    Cholesterol, Total  Date Value Ref Range Status  12/19/2022 108 100 - 199 mg/dL Final   LDL Chol Calc (NIH)  Date Value Ref Range Status  12/19/2022 46 0 - 99 mg/dL Final   HDL  Date Value Ref Range Status  12/19/2022 46 >39 mg/dL Final   Triglycerides  Date Value Ref Range Status  12/19/2022 76 0 - 149 mg/dL Final         Passed - Cr in normal range and within 360 days    Creatinine, Ser  Date Value Ref Range Status  12/19/2022 1.13 0.76 - 1.27 mg/dL Final         Passed - Patient is not pregnant

## 2023-07-09 ENCOUNTER — Encounter: Payer: Self-pay | Admitting: Family Medicine

## 2023-07-09 ENCOUNTER — Ambulatory Visit (INDEPENDENT_AMBULATORY_CARE_PROVIDER_SITE_OTHER): Payer: Medicare (Managed Care) | Admitting: Family Medicine

## 2023-07-09 VITALS — BP 107/70 | HR 71 | Temp 98.0°F | Ht 69.0 in | Wt 237.2 lb

## 2023-07-09 DIAGNOSIS — B001 Herpesviral vesicular dermatitis: Secondary | ICD-10-CM | POA: Diagnosis not present

## 2023-07-09 DIAGNOSIS — N138 Other obstructive and reflux uropathy: Secondary | ICD-10-CM

## 2023-07-09 DIAGNOSIS — E782 Mixed hyperlipidemia: Secondary | ICD-10-CM | POA: Diagnosis not present

## 2023-07-09 DIAGNOSIS — I10 Essential (primary) hypertension: Secondary | ICD-10-CM

## 2023-07-09 DIAGNOSIS — Z0000 Encounter for general adult medical examination without abnormal findings: Secondary | ICD-10-CM | POA: Diagnosis not present

## 2023-07-09 DIAGNOSIS — N401 Enlarged prostate with lower urinary tract symptoms: Secondary | ICD-10-CM

## 2023-07-09 DIAGNOSIS — N183 Chronic kidney disease, stage 3 unspecified: Secondary | ICD-10-CM

## 2023-07-09 LAB — MICROALBUMIN, URINE WAIVED
Creatinine, Urine Waived: 100 mg/dL (ref 10–300)
Microalb, Ur Waived: 10 mg/L (ref 0–19)
Microalb/Creat Ratio: <30 mg/g (ref <30)

## 2023-07-09 MED ORDER — ROSUVASTATIN CALCIUM 20 MG PO TABS: 20.0000 mg | ORAL_TABLET | Freq: Every day | ORAL | 1 refills | Status: DC

## 2023-07-09 MED ORDER — FLUTICASONE PROPIONATE 50 MCG/ACT NA SUSP: 2.0000 | Freq: Every day | NASAL | 6 refills | Status: AC

## 2023-07-09 MED ORDER — TAMSULOSIN HCL 0.4 MG PO CAPS: 0.4000 mg | ORAL_CAPSULE | Freq: Every day | ORAL | 1 refills | Status: DC

## 2023-07-09 MED ORDER — VALACYCLOVIR HCL 500 MG PO TABS: 1000.0000 mg | ORAL_TABLET | Freq: Two times a day (BID) | ORAL | 12 refills | Status: DC

## 2023-07-09 MED ORDER — BENAZEPRIL HCL 10 MG PO TABS: 10.0000 mg | ORAL_TABLET | Freq: Every day | ORAL | 1 refills | Status: DC

## 2023-07-09 NOTE — Assessment & Plan Note (Signed)
 Under good control on current regimen. Continue current regimen. Continue to monitor. Call with any concerns. Refills given. Labs drawn today.

## 2023-07-09 NOTE — Assessment & Plan Note (Signed)
 Rechecking labs today. Await results. Treat as needed.

## 2023-07-09 NOTE — Assessment & Plan Note (Signed)
 Takes valtrex  occasionally. Refill given today.

## 2023-07-09 NOTE — Progress Notes (Signed)
 BP 107/70 (BP Location: Left Arm, Patient Position: Sitting, Cuff Size: Normal)   Pulse 71   Temp 98 F (36.7 C) (Oral)   Ht 5' 9 (1.753 m)   Wt 237 lb 3.2 oz (107.6 kg)   SpO2 98%   BMI 35.03 kg/m    Subjective:    Patient ID: Logan Willis, male    DOB: 1945-08-25, 78 y.o.   MRN: [id number]  HPI: Logan Willis is a 78 y.o. male presenting on 07/09/2023 for comprehensive medical examination. Current medical complaints include:  HYPERTENSION / HYPERLIPIDEMIA Satisfied with current treatment? yes Duration of hypertension: chronic BP monitoring frequency: not checking BP medication side effects: no Past BP meds: benazepril  Duration of hyperlipidemia: chronic Cholesterol medication side effects: no Cholesterol supplements: none Past cholesterol medications: crestor  Medication compliance: excellent compliance Aspirin: no Recent stressors: no Recurrent headaches: no Visual changes: no Palpitations: no Dyspnea: no Chest pain: no Lower extremity edema: no Dizzy/lightheaded: no  BPH BPH status: controlled Satisfied with current treatment?: yes Medication side effects: no Medication compliance: excellent compliance Duration: chronic Nocturia: 1/night Urinary frequency:no Incomplete voiding: no Urgency: no Weak urinary stream: no Straining to start stream: no Dysuria: no Onset: gradual Severity: mild  Interim Problems from his last visit: no  Depression Screen done today and results listed below:     04/10/2023   10:05 AM 12/19/2022    1:41 PM 06/06/2022    8:14 AM 04/04/2022   11:04 AM 12/06/2021    9:49 AM  Depression screen PHQ 2/9  Decreased Interest 0 0 0 0 0  Down, Depressed, Hopeless 0 0 0 0 0  PHQ - 2 Score 0 0 0 0 0  Altered sleeping 0 0 0 0 1  Tired, decreased energy 0 0 0 0 0  Change in appetite 0 0 0 0 0  Feeling bad or failure about yourself  0 0 0 0 0  Trouble concentrating 0 0 0 0 0  Moving slowly or fidgety/restless 0 0 0 0 0  Suicidal  thoughts 0 0 0 0 0  PHQ-9 Score 0 0 0 0 1  Difficult doing work/chores Not difficult at all Not difficult at all Not difficult at all Not difficult at all Not difficult at all     Past Medical History:  Past Medical History:  Diagnosis Date   Actinic keratosis    Body mass index 35.0-35.9, adult    Cold sore    Hypertension    Kidney stone 08/2018    Surgical History:  Past Surgical History:  Procedure Laterality Date   COLON SURGERY     colectomy 2008   COLONOSCOPY     COLONOSCOPY WITH PROPOFOL  N/A 12/10/2014   Procedure: COLONOSCOPY WITH PROPOFOL ;  Surgeon: Rogelia Copping, MD;  Location[facility];  Service: Endoscopy;  Laterality: N/A;   COLONOSCOPY WITH PROPOFOL  N/A 04/29/2019   Procedure: COLONOSCOPY WITH PROPOFOL ;  Surgeon: Copping Rogelia, MD;  Location: [facility];  Service: Endoscopy;  Laterality: N/A;   EXTRACORPOREAL SHOCK WAVE LITHOTRIPSY Left 08/22/2018   Procedure: EXTRACORPOREAL SHOCK WAVE LITHOTRIPSY (ESWL);  Surgeon: Francisca Redell BROCKS, MD;  Location: [facility];  Service: Urology;  Laterality: Left;   HERNIA REPAIR      Medications:  Current Outpatient Medications on File Prior to Visit  Medication Sig   fexofenadine-pseudoephedrine (ALLEGRA-D 24) 180-240 MG 24 hr tablet Take 1 tablet by mouth daily as needed.   fluorouracil  (EFUDEX ) 5 % cream Apply twice daily for 10 days  to scalp and temples   ketoconazole  (NIZORAL ) 2 % cream Apply to feet QHS   No current facility-administered medications on file prior to visit.    Allergies:  No Known Allergies  Social History:  Social History   Socioeconomic History   Marital status: Married    Spouse name: Vickie   Number of children: 1   Years of education: Not on file   Highest education level: Not on file  Occupational History   Not on file  Tobacco Use   Smoking status: Never    Passive exposure: Never   Smokeless tobacco: Never  Vaping Use   Vaping status: Never Used  Substance and Sexual  Activity   Alcohol use: No   Drug use: No   Sexual activity: Yes    Birth control/protection: None  Other Topics Concern   Not on file  Social History Narrative   04/10/23 working full time   Social Drivers of Health   Financial Resource Strain: Low Risk  (04/10/2023)   Overall Financial Resource Strain (CARDIA)    Difficulty of Paying Living Expenses: Not hard at all  Food Insecurity: No Food Insecurity (04/10/2023)   Hunger Vital Sign    Worried About Running Out of Food in the Last Year: Never true    Ran Out of Food in the Last Year: Never true  Transportation Needs: No Transportation Needs (04/10/2023)   PRAPARE - Administrator, Civil Service (Medical): No    Lack of Transportation (Non-Medical): No  Physical Activity: Inactive (04/10/2023)   Exercise Vital Sign    Days of Exercise per Week: 0 days    Minutes of Exercise per Session: 0 min  Stress: No Stress Concern Present (04/10/2023)   [employer] of Occupational Health - Occupational Stress Questionnaire    Feeling of Stress : Not at all  Social Connections: Socially Integrated (04/10/2023)   Social Connection and Isolation Panel    Frequency of Communication with Friends and Family: More than three times a week    Frequency of Social Gatherings with Friends and Family: Once a week    Attends Religious Services: More than 4 times per year    Active Member of [employer] or Organizations: Yes    Attends Engineer, structural: More than 4 times per year    Marital Status: Married  Catering manager Violence: Not At Risk (04/10/2023)   Humiliation, Afraid, Rape, and Kick questionnaire    Fear of Current or Ex-Partner: No    Emotionally Abused: No    Physically Abused: No    Sexually Abused: No   Social History   Tobacco Use  Smoking Status Never   Passive exposure: Never  Smokeless Tobacco Never   Social History   Substance and Sexual Activity  Alcohol Use No    Family History:  Family History   Problem Relation Age of Onset   Heart disease Mother 22   Heart disease Father 68   Diabetes Sister    Stroke Sister    Cancer Neg Hx    COPD Neg Hx     Past medical history, surgical history, medications, allergies, family history and social history reviewed with patient today and changes made to appropriate areas of the chart.   Review of Systems  Constitutional: Negative.   HENT: Negative.    Eyes: Negative.   Respiratory: Negative.    Cardiovascular: Negative.   Gastrointestinal:  Positive for heartburn (with food choices). Negative for abdominal pain, blood  in stool, constipation, diarrhea, melena, nausea and vomiting.  Genitourinary: Negative.   Musculoskeletal: Negative.   Skin: Negative.   Neurological: Negative.   Endo/Heme/Allergies: Negative.   Psychiatric/Behavioral: Negative.     All other ROS negative except what is listed above and in the HPI.      Objective:    BP 107/70 (BP Location: Left Arm, Patient Position: Sitting, Cuff Size: Normal)   Pulse 71   Temp 98 F (36.7 C) (Oral)   Ht 5' 9 (1.753 m)   Wt 237 lb 3.2 oz (107.6 kg)   SpO2 98%   BMI 35.03 kg/m   Wt Readings from Last 3 Encounters:  07/09/23 237 lb 3.2 oz (107.6 kg)  04/10/23 235 lb (106.6 kg)  12/19/22 237 lb 6.4 oz (107.7 kg)    Physical Exam Vitals and nursing note reviewed.  Constitutional:      General: He is not in acute distress.    Appearance: Normal appearance. He is obese. He is not ill-appearing, toxic-appearing or diaphoretic.  HENT:     Head: Normocephalic and atraumatic.     Right Ear: Tympanic membrane, ear canal and external ear normal. There is no impacted cerumen.     Left Ear: Tympanic membrane, ear canal and external ear normal. There is no impacted cerumen.     Nose: Nose normal. No congestion or rhinorrhea.     Mouth/Throat:     Mouth: Mucous membranes are moist.     Pharynx: Oropharynx is clear. No oropharyngeal exudate or posterior oropharyngeal erythema.    Eyes:     General: No scleral icterus.       Right eye: No discharge.        Left eye: No discharge.     Extraocular Movements: Extraocular movements intact.     Conjunctiva/sclera: Conjunctivae normal.     Pupils: Pupils are equal, round, and reactive to light.   Neck:     Vascular: No carotid bruit.   Cardiovascular:     Rate and Rhythm: Normal rate and regular rhythm.     Pulses: Normal pulses.     Heart sounds: No murmur heard.    No friction rub. No gallop.  Pulmonary:     Effort: Pulmonary effort is normal. No respiratory distress.     Breath sounds: Normal breath sounds. No stridor. No wheezing, rhonchi or rales.  Chest:     Chest wall: No tenderness.  Abdominal:     General: Abdomen is flat. Bowel sounds are normal. There is no distension.     Palpations: Abdomen is soft. There is no mass.     Tenderness: There is no abdominal tenderness. There is no right CVA tenderness, left CVA tenderness, guarding or rebound.     Hernia: No hernia is present.  Genitourinary:    Comments: Genital exam deferred with shared decision making  Musculoskeletal:        General: No swelling, tenderness, deformity or signs of injury.     Cervical back: Normal range of motion and neck supple. No rigidity. No muscular tenderness.     Right lower leg: No edema.     Left lower leg: No edema.  Lymphadenopathy:     Cervical: No cervical adenopathy.   Skin:    General: Skin is warm and dry.     Capillary Refill: Capillary refill takes less than 2 seconds.     Coloration: Skin is not jaundiced or pale.     Findings: No bruising, erythema, lesion or  rash.   Neurological:     General: No focal deficit present.     Mental Status: He is alert and oriented to person, place, and time.     Cranial Nerves: No cranial nerve deficit.     Sensory: No sensory deficit.     Motor: No weakness.     Coordination: Coordination normal.     Gait: Gait normal.     Deep Tendon Reflexes: Reflexes normal.    Psychiatric:        Mood and Affect: Mood normal.        Behavior: Behavior normal.        Thought Content: Thought content normal.        Judgment: Judgment normal.     Results for orders placed or performed in visit on 12/19/22  Lipid Panel w/o Chol/HDL Ratio   Collection Time: 12/19/22  1:51 PM  Result Value Ref Range   Cholesterol, Total 108 100 - 199 mg/dL   Triglycerides 76 0 - 149 mg/dL   HDL 46 >60 mg/dL   VLDL Cholesterol Cal 16 5 - 40 mg/dL   LDL Chol Calc (NIH) 46 0 - 99 mg/dL  Comprehensive metabolic panel   Collection Time: 12/19/22  1:51 PM  Result Value Ref Range   Glucose 82 70 - 99 mg/dL   BUN 16 8 - 27 mg/dL   Creatinine, Ser 8.86 0.76 - 1.27 mg/dL   eGFR 68 >40 fO/fpw/8.26   BUN/Creatinine Ratio 14 10 - 24   Sodium 142 134 - 144 mmol/L   Potassium 4.3 3.5 - 5.2 mmol/L   Chloride 106 96 - 106 mmol/L   CO2 22 20 - 29 mmol/L   Calcium  9.3 8.6 - 10.2 mg/dL   Total Protein 6.4 6.0 - 8.5 g/dL   Albumin 4.5 3.8 - 4.8 g/dL   Globulin, Total 1.9 1.5 - 4.5 g/dL   Bilirubin Total 0.4 0.0 - 1.2 mg/dL   Alkaline Phosphatase 59 44 - 121 IU/L   AST 18 0 - 40 IU/L   ALT 26 0 - 44 IU/L      Assessment & Plan:   Problem List Items Addressed This Visit       Cardiovascular and Mediastinum   Essential hypertension   Under good control on current regimen. Continue current regimen. Continue to monitor. Call with any concerns. Refills given. Labs drawn today.        Relevant Medications   benazepril  (LOTENSIN ) 10 MG tablet   rosuvastatin  (CRESTOR ) 20 MG tablet   Other Relevant Orders   Comprehensive metabolic panel with GFR   CBC with Differential/Platelet   TSH   Microalbumin, Urine Waived     Digestive   Recurrent cold sores   Takes valtrex  occasionally. Refill given today.       Relevant Medications   valACYclovir  (VALTREX ) 500 MG tablet     Genitourinary   Benign prostatic hyperplasia with urinary obstruction   Under good control on current  regimen. Continue current regimen. Continue to monitor. Call with any concerns. Refills given. Labs drawn today.       Relevant Medications   tamsulosin  (FLOMAX ) 0.4 MG CAPS capsule   Other Relevant Orders   Comprehensive metabolic panel with GFR   PSA   CKD (chronic kidney disease) stage 3, GFR 30-59 ml/min (HCC)   Rechecking labs today. Await results. Treat as needed.         Other   Obesity, morbid (HCC)   Encouraged diet and  exercise with goal of losing 1-2lbs per week. Call with any concerns.       Mixed hyperlipidemia   Under good control on current regimen. Continue current regimen. Continue to monitor. Call with any concerns. Refills given. Labs drawn today.       Relevant Medications   benazepril  (LOTENSIN ) 10 MG tablet   rosuvastatin  (CRESTOR ) 20 MG tablet   Other Relevant Orders   Comprehensive metabolic panel with GFR   CBC with Differential/Platelet   Lipid Panel w/o Chol/HDL Ratio   Other Visit Diagnoses       Routine general medical examination at a health care facility    -  Primary   Vaccines up to date. Screening labs checked today. Colonoscopy up to date. Continue diet and exercise. Call with any concerns.        LABORATORY TESTING:  Health maintenance labs ordered today as discussed above.   The natural history of prostate cancer and ongoing controversy regarding screening and potential treatment outcomes of prostate cancer has been discussed with the patient. The meaning of a false positive PSA and a false negative PSA has been discussed. He indicates understanding of the limitations of this screening test and wishes to proceed with screening PSA testing.   IMMUNIZATIONS:   - Tdap: Tetanus vaccination status reviewed: last tetanus booster within 10 years. - Influenza: Postponed to flu season - Pneumovax: Up to date - Prevnar: Up to date - COVID: Up to date - HPV: Not applicable - Shingrix  vaccine: Up to date  SCREENING: - Colonoscopy: Up to  date  Discussed with patient purpose of the colonoscopy is to detect colon cancer at curable precancerous or early stages   PATIENT COUNSELING:    Sexuality: Discussed sexually transmitted diseases, partner selection, use of condoms, avoidance of unintended pregnancy  and contraceptive alternatives.   Advised to avoid cigarette smoking.  I discussed with the patient that most people either abstain from alcohol or drink within safe limits (<=14/week and <=4 drinks/occasion for males, <=7/weeks and <= 3 drinks/occasion for females) and that the risk for alcohol disorders and other health effects rises proportionally with the number of drinks per week and how often a drinker exceeds daily limits.  Discussed cessation/primary prevention of drug use and availability of treatment for abuse.   Diet: Encouraged to adjust caloric intake to maintain  or achieve ideal body weight, to reduce intake of dietary saturated fat and total fat, to limit sodium intake by avoiding high sodium foods and not adding table salt, and to maintain adequate dietary potassium and calcium  preferably from fresh fruits, vegetables, and low-fat dairy products.    stressed the importance of regular exercise  Injury prevention: Discussed safety belts, safety helmets, smoke detector, smoking near bedding or upholstery.   Dental health: Discussed importance of regular tooth brushing, flossing, and dental visits.   Follow up plan: NEXT PREVENTATIVE PHYSICAL DUE IN 1 YEAR. Return in about 6 months (around 01/08/2024).

## 2023-07-09 NOTE — Assessment & Plan Note (Signed)
 Encouraged diet and exercise with goal of losing 1-2lbs per week. Call with any concerns.

## 2023-07-10 LAB — CBC WITH DIFFERENTIAL/PLATELET
Basophils Absolute: 0 10*3/uL (ref 0.0–0.2)
Basos: 0 %
EOS (ABSOLUTE): 0.1 10*3/uL (ref 0.0–0.4)
Eos: 2 %
Hematocrit: 44.5 % (ref 37.5–51.0)
Hemoglobin: 14.9 g/dL (ref 13.0–17.7)
Immature Grans (Abs): 0 10*3/uL (ref 0.0–0.1)
Immature Granulocytes: 0 %
Lymphocytes Absolute: 1.3 10*3/uL (ref 0.7–3.1)
Lymphs: 28 %
MCH: 30 pg (ref 26.6–33.0)
MCHC: 33.5 g/dL (ref 31.5–35.7)
MCV: 90 fL (ref 79–97)
Monocytes Absolute: 0.3 10*3/uL (ref 0.1–0.9)
Monocytes: 6 %
Neutrophils Absolute: 2.9 10*3/uL (ref 1.4–7.0)
Neutrophils: 64 %
Platelets: 150 10*3/uL (ref 150–450)
RBC: 4.96 x10E6/uL (ref 4.14–5.80)
RDW: 13.4 % (ref 11.6–15.4)
WBC: 4.7 10*3/uL (ref 3.4–10.8)

## 2023-07-10 LAB — LIPID PANEL W/O CHOL/HDL RATIO
Cholesterol, Total: 108 mg/dL (ref 100–199)
HDL: 44 mg/dL (ref >39)
LDL Chol Calc (NIH): 46 mg/dL (ref 0–99)
Triglycerides: 95 mg/dL (ref 0–149)
VLDL Cholesterol Cal: 18 mg/dL (ref 5–40)

## 2023-07-10 LAB — COMPREHENSIVE METABOLIC PANEL WITH GFR
ALT: 23 IU/L (ref 0–44)
AST: 19 IU/L (ref 0–40)
Albumin: 4.3 g/dL (ref 3.8–4.8)
Alkaline Phosphatase: 58 IU/L (ref 44–121)
BUN/Creatinine Ratio: 17 (ref 10–24)
BUN: 18 mg/dL (ref 8–27)
Bilirubin Total: 0.4 mg/dL (ref 0.0–1.2)
CO2: 20 mmol/L (ref 20–29)
Calcium: 9.1 mg/dL (ref 8.6–10.2)
Chloride: 106 mmol/L (ref 96–106)
Creatinine, Ser: 1.07 mg/dL (ref 0.76–1.27)
Globulin, Total: 1.5 g/dL (ref 1.5–4.5)
Glucose: 140 mg/dL — ABNORMAL HIGH (ref 70–99)
Potassium: 4.1 mmol/L (ref 3.5–5.2)
Sodium: 140 mmol/L (ref 134–144)
Total Protein: 5.8 g/dL — ABNORMAL LOW (ref 6.0–8.5)
eGFR: 72 mL/min/{1.73_m2} (ref >59)

## 2023-07-10 LAB — TSH: TSH: 1.2 u[IU]/mL (ref 0.450–4.500)

## 2023-07-10 LAB — PSA: Prostate Specific Ag, Serum: 2.7 ng/mL (ref 0.0–4.0)

## 2023-07-12 ENCOUNTER — Ambulatory Visit: Payer: Self-pay | Admitting: Family Medicine

## 2023-10-22 DIAGNOSIS — M3501 Sicca syndrome with keratoconjunctivitis: Secondary | ICD-10-CM | POA: Diagnosis not present

## 2023-10-22 DIAGNOSIS — H43813 Vitreous degeneration, bilateral: Secondary | ICD-10-CM | POA: Diagnosis not present

## 2023-10-22 DIAGNOSIS — H02889 Meibomian gland dysfunction of unspecified eye, unspecified eyelid: Secondary | ICD-10-CM | POA: Diagnosis not present

## 2023-10-22 DIAGNOSIS — H2513 Age-related nuclear cataract, bilateral: Secondary | ICD-10-CM | POA: Diagnosis not present

## 2023-11-21 ENCOUNTER — Encounter: Payer: Self-pay | Admitting: Dermatology

## 2023-11-21 ENCOUNTER — Ambulatory Visit: Payer: BC Managed Care – PPO | Admitting: Dermatology

## 2023-11-21 DIAGNOSIS — Z5111 Encounter for antineoplastic chemotherapy: Secondary | ICD-10-CM

## 2023-11-21 DIAGNOSIS — B351 Tinea unguium: Secondary | ICD-10-CM | POA: Diagnosis not present

## 2023-11-21 DIAGNOSIS — L578 Other skin changes due to chronic exposure to nonionizing radiation: Secondary | ICD-10-CM

## 2023-11-21 DIAGNOSIS — B353 Tinea pedis: Secondary | ICD-10-CM

## 2023-11-21 DIAGNOSIS — Z79899 Other long term (current) drug therapy: Secondary | ICD-10-CM

## 2023-11-21 DIAGNOSIS — D229 Melanocytic nevi, unspecified: Secondary | ICD-10-CM

## 2023-11-21 DIAGNOSIS — W908XXA Exposure to other nonionizing radiation, initial encounter: Secondary | ICD-10-CM | POA: Diagnosis not present

## 2023-11-21 DIAGNOSIS — D1801 Hemangioma of skin and subcutaneous tissue: Secondary | ICD-10-CM

## 2023-11-21 DIAGNOSIS — Z7189 Other specified counseling: Secondary | ICD-10-CM

## 2023-11-21 DIAGNOSIS — Z1283 Encounter for screening for malignant neoplasm of skin: Secondary | ICD-10-CM

## 2023-11-21 DIAGNOSIS — L820 Inflamed seborrheic keratosis: Secondary | ICD-10-CM

## 2023-11-21 DIAGNOSIS — L814 Other melanin hyperpigmentation: Secondary | ICD-10-CM

## 2023-11-21 DIAGNOSIS — L570 Actinic keratosis: Secondary | ICD-10-CM | POA: Diagnosis not present

## 2023-11-21 DIAGNOSIS — L821 Other seborrheic keratosis: Secondary | ICD-10-CM

## 2023-11-21 MED ORDER — TERBINAFINE HCL 250 MG PO TABS
250.0000 mg | ORAL_TABLET | Freq: Every day | ORAL | 0 refills | Status: AC
Start: 2023-11-21 — End: ?

## 2023-11-21 MED ORDER — KETOCONAZOLE 2 % EX CREA: TOPICAL_CREAM | CUTANEOUS | 6 refills | Status: AC

## 2023-11-21 NOTE — Progress Notes (Signed)
 Follow-Up Visit   Subjective  Logan Willis is a 78 y.o. male who presents for the following: Skin Cancer Screening and Full Body Skin Exam. Hx of AKs. No personal Hx of skin cancer. Did  not use 5FU/Calcipotriene cream.   The patient presents for Total-Body Skin Exam (TBSE) for skin cancer screening and mole check. The patient has spots, moles and lesions to be evaluated, some may be new or changing and the patient may have concern these could be cancer.  The following portions of the chart were reviewed this encounter and updated as appropriate: medications, allergies, medical history  Review of Systems:  No other skin or systemic complaints except as noted in HPI or Assessment and Plan.  Objective  Well appearing patient in no apparent distress; mood and affect are within normal limits.  A full examination was performed including scalp, head, eyes, ears, nose, lips, neck, chest, axillae, abdomen, back, buttocks, bilateral upper extremities, bilateral lower extremities, hands, feet, fingers, toes, fingernails, and toenails. All findings within normal limits unless otherwise noted below.   Relevant physical exam findings are noted in the Assessment and Plan.  Right Forearm x3, L chest x1 (4) Erythematous keratotic or waxy stuck-on papule or plaque. face x4 (4) Erythematous thin papules/macules with gritty scale.   Assessment & Plan   SKIN CANCER SCREENING PERFORMED TODAY.  ACTINIC DAMAGE WITH PRECANCEROUS ACTINIC KERATOSES Counseling for Topical Chemotherapy Management: Patient exhibits: - Severe, confluent actinic changes with pre-cancerous actinic keratoses that is secondary to cumulative UV radiation exposure over time - Condition that is severe; chronic, not at goal. - diffuse scaly erythematous macules and papules with underlying dyspigmentation - Discussed Prescription Field Treatment topical Chemotherapy for Severe, Chronic Confluent Actinic Changes with Pre-Cancerous  Actinic Keratoses Field treatment involves treatment of an entire area of skin that has confluent Actinic Changes (Sun/ Ultraviolet light damage) and PreCancerous Actinic Keratoses by method of PhotoDynamic Therapy (PDT) and/or prescription Topical Chemotherapy agents such as 5-fluorouracil , 5-fluorouracil /calcipotriene, and/or imiquimod.  The purpose is to decrease the number of clinically evident and subclinical PreCancerous lesions to prevent progression to development of skin cancer by chemically destroying early precancer changes that may or may not be visible.  It has been shown to reduce the risk of developing skin cancer in the treated area. As a result of treatment, redness, scaling, crusting, and open sores may occur during treatment course. One or more than one of these methods may be used and may have to be used several times to control, suppress and eliminate the PreCancerous changes. Discussed treatment course, expected reaction, and possible side effects. - Recommend daily broad spectrum sunscreen SPF 30+ to sun-exposed areas, reapply every 2 hours as needed.  - Staying in the shade or wearing long sleeves, sun glasses (UVA+UVB protection) and wide brim hats (4-inch brim around the entire circumference of the hat) are also recommended. - Call for new or changing lesions. - Will schedule photodynamic therapy with debridement to the scalp   LENTIGINES, SEBORRHEIC KERATOSES, HEMANGIOMAS - Benign normal skin lesions - Benign-appearing - Call for any changes  MELANOCYTIC NEVI - Tan-brown and/or pink-flesh-colored symmetric macules and papules - Benign appearing on exam today - Observation - Call clinic for new or changing moles - Recommend daily use of broad spectrum spf 30+ sunscreen to sun-exposed areas.    ONYCHOMYCOSIS Exam: Thickened toenails with subungal debris c/w onychomycosis Chronic and persistent condition with duration or expected duration over one year. Condition is  symptomatic/ bothersome to  patient. Not currently at goal. Reviewed labs from 07/09/2023. LFT WNL to restart oral Terbinafine .  Treatment Plan: Start Terbinafine  250 mg one tablet once daily. Call in 1 month with update on tolerance of oral Terbinafine .   TINEA PEDIS Exam: Scaling and maceration web spaces and over distal and lateral soles. Treatment Plan: Continue Ketoconazole  2% cream nightly to feet.  INFLAMED SEBORRHEIC KERATOSIS (4) Right Forearm x3, L chest x1 (4) Symptomatic, irritating, patient would like treated. Destruction of lesion - Right Forearm x3, L chest x1 (4) Complexity: simple   Destruction method: cryotherapy   Informed consent: discussed and consent obtained   Timeout:  patient name, date of birth, surgical site, and procedure verified Lesion destroyed using liquid nitrogen: Yes   Region frozen until ice ball extended beyond lesion: Yes   Outcome: patient tolerated procedure well with no complications   Post-procedure details: wound care instructions given   Additional details:  Prior to procedure, discussed risks of blister formation, small wound, skin dyspigmentation, or rare scar following cryotherapy. Recommend Vaseline ointment to treated areas while healing.   AK (ACTINIC KERATOSIS) (4) face x4 (4) Actinic keratoses are precancerous spots that appear secondary to cumulative UV radiation exposure/sun exposure over time. They are chronic with expected duration over 1 year. A portion of actinic keratoses will progress to squamous cell carcinoma of the skin. It is not possible to reliably predict which spots will progress to skin cancer and so treatment is recommended to prevent development of skin cancer.  Recommend daily broad spectrum sunscreen SPF 30+ to sun-exposed areas, reapply every 2 hours as needed.  Recommend staying in the shade or wearing long sleeves, sun glasses (UVA+UVB protection) and wide brim hats (4-inch brim around the entire circumference of  the hat). Call for new or changing lesions. Destruction of lesion - face x4 (4) Complexity: simple   Destruction method: cryotherapy   Informed consent: discussed and consent obtained   Timeout:  patient name, date of birth, surgical site, and procedure verified Lesion destroyed using liquid nitrogen: Yes   Region frozen until ice ball extended beyond lesion: Yes   Outcome: patient tolerated procedure well with no complications   Post-procedure details: wound care instructions given   Additional details:  Prior to procedure, discussed risks of blister formation, small wound, skin dyspigmentation, or rare scar following cryotherapy. Recommend Vaseline ointment to treated areas while healing.   TINEA UNGUIUM   Related Medications ketoconazole  (NIZORAL ) 2 % cream Apply to feet QHS Return in about 1 year (around 11/20/2024) for TBSE, PDT Next Available With debridement to scalp.  I, Jill Parcell, CMA, am acting as scribe for Alm Rhyme, MD.   Documentation: I have reviewed the above documentation for accuracy and completeness, and I agree with the above.  Alm Rhyme, MD

## 2023-11-21 NOTE — Patient Instructions (Addendum)
 Photodynamic Therapy- Blue or Red Light Therapy  Actinic keratoses are the dry, red scaly spots on the skin caused by sun damage. A portion of these spots can turn into skin cancer with time, and treating them can help prevent development of skin cancer.   Treatment of these spots requires removal of the defective skin cells. There are various ways to remove actinic keratoses, including freezing with liquid nitrogen, treatment with creams, or treatment with a blue light procedure in the office.   Photodynamic Therapy (PDT), also known as blue or red light therapy is an in office procedure used to treat actinic keratoses. It works by targeting precancerous cells. After treatment, these cells peel off and are replaced by healthy ones.   For your phototherapy appointment, you will have two appointments on the day of your treatment. The first appointment will be to apply a cream to the treatment area. You will leave this cream on for 1-2 hours depending on the area being treated. The second appointment will be to shine a blue or red light on the area for 16-20 minutes to kill off the precancer cells. It is common to experience a burning sensation during the treatment.  After your treatment, it will be important to keep the treated areas of skin out of the sun completely for 48-72 hours (2-3 days) to prevent having a reaction.   Common side effects include: - Burning or stinging, which may be severe and can last up to 24-72 hours after your treatment - Scaling and crusting which may last up to 2 weeks - Redness, swelling and/or peeling which can last up to 4 weeks  To Care for Your Skin After PDT/Blue/Red Light Therapy: - Wash with soap, water  and shampoo as normal. - If needed, you can use cold compresses (e.g. ice packs) for comfort - If okay with your primary care doctor, you may use analgesics such as acetaminophen  (tylenol ) every 4-6 hours, not to exceed recommended dose - You may apply  Cerave Healing Ointment, Vaseline or Aquaphor as needed - If you have a lot of swelling you may take a Benadryl  to help with this (this may cause drowsiness), not to exceed recommended dose. This may increase the risk of falls in people over 65 and may slow reaction time while driving, so it is not recommended to take before driving or operating machinery. - Sun Precautions - Wear a wide brim hat for the next week if outside  - Wear a sunblock with zinc or titanium dioxide at least SPF 50 daily  If you have any questions or concerns, please call the office and ask to speak with a nurse.   --------------------------------------------------------------------------------------------------------------   Cryotherapy Aftercare  Wash gently with soap and water  everyday.   Apply Vaseline Jelly daily until healed.    Recommend daily broad spectrum sunscreen SPF 30+ to sun-exposed areas, reapply every 2 hours as needed. Call for new or changing lesions.  Staying in the shade or wearing long sleeves, sun glasses (UVA+UVB protection) and wide brim hats (4-inch brim around the entire circumference of the hat) are also recommended for sun protection.      Melanoma ABCDEs  Melanoma is the most dangerous type of skin cancer, and is the leading cause of death from skin disease.  You are more likely to develop melanoma if you: Have light-colored skin, light-colored eyes, or red or blond hair Spend a lot of time in the sun Tan regularly, either outdoors or in a tanning bed  Have had blistering sunburns, especially during childhood Have a close family member who has had a melanoma Have atypical moles or large birthmarks  Early detection of melanoma is key since treatment is typically straightforward and cure rates are extremely high if we catch it early.   The first sign of melanoma is often a change in a mole or a new dark spot.  The ABCDE system is a way of remembering the signs of melanoma.  A for  asymmetry:  The two halves do not match. B for border:  The edges of the growth are irregular. C for color:  A mixture of colors are present instead of an even brown color. D for diameter:  Melanomas are usually (but not always) greater than 6mm - the size of a pencil eraser. E for evolution:  The spot keeps changing in size, shape, and color.  Please check your skin once per month between visits. You can use a small mirror in front and a large mirror behind you to keep an eye on the back side or your body.   If you see any new or changing lesions before your next follow-up, please call to schedule a visit.  Please continue daily skin protection including broad spectrum sunscreen SPF 30+ to sun-exposed areas, reapplying every 2 hours as needed when you're outdoors.   Staying in the shade or wearing long sleeves, sun glasses (UVA+UVB protection) and wide brim hats (4-inch brim around the entire circumference of the hat) are also recommended for sun protection.      Due to recent changes in healthcare laws, you may see results of your pathology and/or laboratory studies on MyChart before the doctors have had a chance to review them. We understand that in some cases there may be results that are confusing or concerning to you. Please understand that not all results are received at the same time and often the doctors may need to interpret multiple results in order to provide you with the best plan of care or course of treatment. Therefore, we ask that you please give us  2 business days to thoroughly review all your results before contacting the office for clarification. Should we see a critical lab result, you will be contacted sooner.   If You Need Anything After Your Visit  If you have any questions or concerns for your doctor, please call our main line at [phone] and press option 4 to reach your doctor's medical assistant. If no one answers, please leave a voicemail as directed and we will  return your call as soon as possible. Messages left after 4 pm will be answered the following business day.   You may also send us  a message via MyChart. We typically respond to MyChart messages within 1-2 business days.  For prescription refills, please ask your pharmacy to contact our office. Our fax number is [phone].  If you have an urgent issue when the clinic is closed that cannot wait until the next business day, you can page your doctor at the number below.    Please note that while we do our best to be available for urgent issues outside of office hours, we are not available 24/7.   If you have an urgent issue and are unable to reach us , you may choose to seek medical care at your doctor's office, retail clinic, urgent care center, or emergency room.  If you have a medical emergency, please immediately call 911 or go to the emergency department.  Pager  Numbers  - Dr. Hester: [phone]  - Dr. Jackquline: [phone]  - Dr. Claudene: [phone]   - Dr. Raymund: [phone]  In the event of inclement weather, please call our main line at [phone] for an update on the status of any delays or closures.  Dermatology Medication Tips: Please keep the boxes that topical medications come in in order to help keep track of the instructions about where and how to use these. Pharmacies typically print the medication instructions only on the boxes and not directly on the medication tubes.   If your medication is too expensive, please contact our office at [phone] option 4 or send us  a message through MyChart.   We are unable to tell what your co-pay for medications will be in advance as this is different depending on your insurance coverage. However, we may be able to find a substitute medication at lower cost or fill out paperwork to get insurance to cover a needed medication.   If a prior authorization is required to get your medication covered by your insurance company,  please allow us  1-2 business days to complete this process.  Drug prices often vary depending on where the prescription is filled and some pharmacies may offer cheaper prices.  The website www.goodrx.com contains coupons for medications through different pharmacies. The prices here do not account for what the cost may be with help from insurance (it may be cheaper with your insurance), but the website can give you the price if you did not use any insurance.  - You can print the associated coupon and take it with your prescription to the pharmacy.  - You may also stop by our office during regular business hours and pick up a GoodRx coupon card.  - If you need your prescription sent electronically to a different pharmacy, notify our office through [facility] or by phone at [phone] option 4.     Si Usted Necesita Algo Despus de Su Visita  Tambin puede enviarnos un mensaje a travs de Clinical Cytogeneticist. Por lo general respondemos a los mensajes de MyChart en el transcurso de 1 a 2 das hbiles.  Para renovar recetas, por favor pida a su farmacia que se ponga en contacto con nuestra oficina. Randi lakes de fax es [place] [phone].  Si tiene un asunto urgente cuando la clnica est cerrada y que no puede esperar hasta el siguiente da hbil, puede llamar/localizar a su doctor(a) al nmero que aparece a continuacin.   Por favor, tenga en cuenta que aunque hacemos todo lo posible para estar disponibles para asuntos urgentes fuera del horario de [place], no estamos disponibles las 24 horas del da, los 7 [address]  [phone] de la [place].   Si tiene un problema urgente y no puede comunicarse con nosotros, puede optar por buscar atencin mdica  en el consultorio de su doctor(a), en una clnica privada, en un centro de atencin urgente o en una sala de emergencias.  Si tiene engineer, drilling, por favor llame inmediatamente al 911 o vaya a la sala de emergencias.  Nmeros de bper  - Dr. Hester:  [phone]  - Dra. Jackquline: [phone]  - Dr. Claudene: [phone]  - Dra. Kitts: [phone]  En caso de inclemencias del [place], por favor llame a nuestra lnea principal al [phone] para una actualizacin sobre el estado de cualquier retraso o cierre.  Consejos para la medicacin en dermatologa: Por favor, guarde las cajas en las que vienen los medicamentos de uso tpico para ayudarle a designer, jewellery las  instrucciones sobre dnde y cmo usarlos. Las farmacias generalmente imprimen las instrucciones del medicamento slo en las cajas y no directamente en los tubos del [place].   Si su medicamento es muy caro, por favor, pngase en contacto con landry rieger llamando al [phone] y presione la opcin 4 o envenos un mensaje a travs de Clinical Cytogeneticist.   No podemos decirle cul ser su copago por los medicamentos por adelantado ya que esto es diferente dependiendo de la cobertura de su seguro. Sin embargo, es posible que podamos encontrar un medicamento sustituto a audiological scientist un formulario para que el seguro cubra el medicamento que se considera necesario.   Si se requiere una autorizacin previa para que su compaa de seguros cubra su medicamento, por favor permtanos de 1 a 2 das hbiles para completar este proceso.  Los precios de los medicamentos varan con frecuencia dependiendo del environmental consultant de dnde se surte la receta y alguna farmacias pueden ofrecer precios ms baratos.  El sitio web www.goodrx.com tiene cupones para medicamentos de health and safety inspector. Los precios aqu no tienen en cuenta lo que podra costar con la ayuda del seguro (puede ser ms barato con su seguro), pero el sitio web puede darle el precio si no utiliz tourist information centre manager.  - Puede imprimir el cupn correspondiente y llevarlo con su receta a la farmacia.  - Tambin puede pasar por nuestra oficina durante el horario de atencin regular y education officer, museum una tarjeta de cupones de GoodRx.  - Si necesita que su receta  se enve electrnicamente a una farmacia diferente, informe a nuestra oficina a travs de MyChart de [place] o por telfono llamando al [phone] y presione la opcin 4.

## 2023-12-20 ENCOUNTER — Other Ambulatory Visit: Payer: Self-pay | Admitting: Dermatology

## 2023-12-21 ENCOUNTER — Encounter: Payer: Self-pay | Admitting: Family Medicine

## 2024-01-08 ENCOUNTER — Ambulatory Visit: Payer: Medicare (Managed Care) | Admitting: Family Medicine

## 2024-01-09 ENCOUNTER — Other Ambulatory Visit: Payer: Self-pay | Admitting: Family Medicine

## 2024-01-09 DIAGNOSIS — I10 Essential (primary) hypertension: Secondary | ICD-10-CM

## 2024-01-17 ENCOUNTER — Encounter: Payer: Self-pay | Admitting: Dermatology

## 2024-01-17 ENCOUNTER — Ambulatory Visit (INDEPENDENT_AMBULATORY_CARE_PROVIDER_SITE_OTHER): Payer: Medicare (Managed Care) | Admitting: Dermatology

## 2024-01-17 DIAGNOSIS — L570 Actinic keratosis: Secondary | ICD-10-CM

## 2024-01-17 MED ORDER — AMINOLEVULINIC ACID HCL 10 % EX GEL
2000.0000 mg | Freq: Once | CUTANEOUS | Status: AC
  Administered 2024-01-17: 2000 mg via TOPICAL

## 2024-01-17 NOTE — Progress Notes (Signed)
 Patient completed red light phototherapy with debridement today.  ACTINIC KERATOSES Exam: Erythematous thin papules/macules with gritty scale.  Treatment Plan:  Red Light Photodynamic therapy  Procedure discussed: discussed risks, benefits, side effects. and alternatives   Prep: site scrubbed/prepped with acetone   Debridement needed: Yes (performed by Physician with sand paper.  (CPT [id number])) Location:  scalp Number of lesions:  Multiple (> 15) Type of treatment:  Red light Aminolevulinic Acid (see MAR for details): Ameluz  Aminolevulinic Acid comment:  [id number] Amount of Ameluz  (mg):  1 Incubation time (minutes):  120 Number of minutes under lamp:  10 Cooling:  Fan Outcome: patient tolerated procedure well with no complications   Post-procedure details: sunscreen applied and aftercare instructions given to patient    Related Medications Aminolevulinic Acid HCl 10 % GEL 2,000 mg  Amanda White, RMA  I personally debrided area prior to application of aminolevulinic acid   Documentation: I have reviewed the above documentation for accuracy and completeness, and I agree with the above.  Alm Rhyme, MD

## 2024-01-17 NOTE — Patient Instructions (Signed)

## 2024-01-22 ENCOUNTER — Encounter: Payer: Self-pay | Admitting: Family Medicine

## 2024-01-22 ENCOUNTER — Ambulatory Visit: Payer: Medicare (Managed Care) | Admitting: Family Medicine

## 2024-01-22 VITALS — BP 122/82 | HR 68 | Temp 97.4°F | Ht 69.0 in | Wt 240.0 lb

## 2024-01-22 DIAGNOSIS — N401 Enlarged prostate with lower urinary tract symptoms: Secondary | ICD-10-CM

## 2024-01-22 DIAGNOSIS — B001 Herpesviral vesicular dermatitis: Secondary | ICD-10-CM

## 2024-01-22 DIAGNOSIS — K2100 Gastro-esophageal reflux disease with esophagitis, without bleeding: Secondary | ICD-10-CM

## 2024-01-22 DIAGNOSIS — N183 Chronic kidney disease, stage 3 unspecified: Secondary | ICD-10-CM

## 2024-01-22 DIAGNOSIS — N138 Other obstructive and reflux uropathy: Secondary | ICD-10-CM

## 2024-01-22 DIAGNOSIS — I10 Essential (primary) hypertension: Secondary | ICD-10-CM

## 2024-01-22 DIAGNOSIS — M79605 Pain in left leg: Secondary | ICD-10-CM | POA: Diagnosis not present

## 2024-01-22 DIAGNOSIS — E782 Mixed hyperlipidemia: Secondary | ICD-10-CM

## 2024-01-22 MED ORDER — VALACYCLOVIR HCL 500 MG PO TABS: 1000.0000 mg | ORAL_TABLET | Freq: Two times a day (BID) | ORAL | 12 refills | Status: AC

## 2024-01-22 MED ORDER — NAPROXEN 500 MG PO TABS: 500.0000 mg | ORAL_TABLET | Freq: Two times a day (BID) | ORAL | 3 refills | Status: AC

## 2024-01-22 MED ORDER — ROSUVASTATIN CALCIUM 20 MG PO TABS: 20.0000 mg | ORAL_TABLET | Freq: Every day | ORAL | 1 refills | Status: AC

## 2024-01-22 MED ORDER — TAMSULOSIN HCL 0.4 MG PO CAPS: 0.4000 mg | ORAL_CAPSULE | Freq: Every day | ORAL | 1 refills | Status: AC

## 2024-01-22 MED ORDER — OMEPRAZOLE 20 MG PO CPDR: 20.0000 mg | DELAYED_RELEASE_CAPSULE | Freq: Every day | ORAL | 3 refills | Status: AC

## 2024-01-22 MED ORDER — BENAZEPRIL HCL 10 MG PO TABS: 10.0000 mg | ORAL_TABLET | Freq: Every day | ORAL | 1 refills | Status: AC

## 2024-01-22 NOTE — Assessment & Plan Note (Signed)
 Under good control on current regimen. Continue current regimen. Continue to monitor. Call with any concerns. Refills given. Labs drawn today.

## 2024-01-22 NOTE — Progress Notes (Signed)
 "  BP 122/82   Pulse 68   Temp (!) 97.4 F (36.3 C) (Oral)   Ht 5' 9 (1.753 m)   Wt 240 lb (108.9 kg)   SpO2 97%   BMI 35.44 kg/m    Subjective:    Patient ID: Logan Willis, male    DOB: 11/06/1945, 79 y.o.   MRN: [id number]  HPI: Logan Willis is a 79 y.o. male  Chief Complaint  Patient presents with   Gastroesophageal Reflux    Worsening   Muscle Pain    Left calf. Onset a couple months ago. Concerns of possible pulled muscle   Hypertension   GERD GERD control status: worse Satisfied with current treatment? no Heartburn frequency: at least 1x week Medication side effects: no  Previous GERD medications: tums Antacid use frequency:  when it happens Duration: about a month Nature: burping Dysphagia: no Odynophagia:  no Hematemesis: no Blood in stool: no EGD: no  HYPERTENSION / HYPERLIPIDEMIA Satisfied with current treatment? yes Duration of hypertension: chronic BP monitoring frequency: not checking BP medication side effects: no Past BP meds: benazepril  Duration of hyperlipidemia: chronic Cholesterol medication side effects: no Cholesterol supplements: none Past cholesterol medications: crestor  Medication compliance: excellent compliance Aspirin: no Recent stressors: no Recurrent headaches: no Visual changes: no Palpitations: no Dyspnea: no Chest pain: no Lower extremity edema: no Dizzy/lightheaded: no  BPH BPH status: controlled Satisfied with current treatment?: yes Medication side effects: no Medication compliance: excellent compliance Duration: chronic Nocturia: 1/night Urinary frequency:no Incomplete voiding: no Urgency: no Weak urinary stream: no Straining to start stream: no Dysuria: no Onset: gradual Severity: mild  LEG CRAMPS Duration: couple of months Pain: yes Severity: moderate  Quality:  aching Location:  L calf Bilateral:  no Onset: gradual Frequency: getting off the floor Paresthesias:   no Decreased sensation:   no Weakness:   no Status: stable    Relevant past medical, surgical, family and social history reviewed and updated as indicated. Interim medical history since our last visit reviewed. Allergies and medications reviewed and updated.  Review of Systems  Constitutional: Negative.   Respiratory: Negative.    Cardiovascular: Negative.   Musculoskeletal:  Positive for myalgias. Negative for arthralgias, back pain, gait problem, joint swelling, neck pain and neck stiffness.  Skin: Negative.   Neurological: Negative.   Psychiatric/Behavioral: Negative.      Per HPI unless specifically indicated above     Objective:    BP 122/82   Pulse 68   Temp (!) 97.4 F (36.3 C) (Oral)   Ht 5' 9 (1.753 m)   Wt 240 lb (108.9 kg)   SpO2 97%   BMI 35.44 kg/m   Wt Readings from Last 3 Encounters:  01/22/24 240 lb (108.9 kg)  07/09/23 237 lb 3.2 oz (107.6 kg)  04/10/23 235 lb (106.6 kg)    Physical Exam Vitals and nursing note reviewed.  Constitutional:      General: He is not in acute distress.    Appearance: Normal appearance. He is obese. He is not ill-appearing, toxic-appearing or diaphoretic.  HENT:     Head: Normocephalic and atraumatic.     Right Ear: External ear normal.     Left Ear: External ear normal.     Nose: Nose normal.     Mouth/Throat:     Mouth: Mucous membranes are moist.     Pharynx: Oropharynx is clear.  Eyes:     General: No scleral icterus.       Right  eye: No discharge.        Left eye: No discharge.     Extraocular Movements: Extraocular movements intact.     Conjunctiva/sclera: Conjunctivae normal.     Pupils: Pupils are equal, round, and reactive to light.  Cardiovascular:     Rate and Rhythm: Normal rate and regular rhythm.     Pulses: Normal pulses.     Heart sounds: Normal heart sounds. No murmur heard.    No friction rub. No gallop.  Pulmonary:     Effort: Pulmonary effort is normal. No respiratory distress.     Breath sounds: Normal breath  sounds. No stridor. No wheezing, rhonchi or rales.  Chest:     Chest wall: No tenderness.  Musculoskeletal:        General: Normal range of motion.     Cervical back: Normal range of motion and neck supple.  Skin:    General: Skin is warm and dry.     Capillary Refill: Capillary refill takes less than 2 seconds.     Coloration: Skin is not jaundiced or pale.     Findings: No bruising, erythema, lesion or rash.  Neurological:     General: No focal deficit present.     Mental Status: He is alert and oriented to person, place, and time. Mental status is at baseline.  Psychiatric:        Mood and Affect: Mood normal.        Behavior: Behavior normal.        Thought Content: Thought content normal.        Judgment: Judgment normal.     Results for orders placed or performed in visit on 07/09/23  Microalbumin, Urine Waived   Collection Time: 07/09/23  1:23 PM  Result Value Ref Range   Microalb, Ur Waived 10 0 - 19 mg/L   Creatinine, Urine Waived 100 10 - 300 mg/dL   Microalb/Creat Ratio <30 <30 mg/g  Comprehensive metabolic panel with GFR   Collection Time: 07/09/23  1:24 PM  Result Value Ref Range   Glucose 140 (H) 70 - 99 mg/dL   BUN 18 8 - 27 mg/dL   Creatinine, Ser 8.92 0.76 - 1.27 mg/dL   eGFR 72 >40 fO/fpw/8.26   BUN/Creatinine Ratio 17 10 - 24   Sodium 140 134 - 144 mmol/L   Potassium 4.1 3.5 - 5.2 mmol/L   Chloride 106 96 - 106 mmol/L   CO2 20 20 - 29 mmol/L   Calcium  9.1 8.6 - 10.2 mg/dL   Total Protein 5.8 (L) 6.0 - 8.5 g/dL   Albumin 4.3 3.8 - 4.8 g/dL   Globulin, Total 1.5 1.5 - 4.5 g/dL   Bilirubin Total 0.4 0.0 - 1.2 mg/dL   Alkaline Phosphatase 58 44 - 121 IU/L   AST 19 0 - 40 IU/L   ALT 23 0 - 44 IU/L  CBC with Differential/Platelet   Collection Time: 07/09/23  1:24 PM  Result Value Ref Range   WBC 4.7 3.4 - 10.8 x10E3/uL   RBC 4.96 4.14 - 5.80 x10E6/uL   Hemoglobin 14.9 13.0 - 17.7 g/dL   Hematocrit 55.4 62.4 - 51.0 %   MCV 90 79 - 97 fL   MCH 30.0  26.6 - 33.0 pg   MCHC 33.5 31.5 - 35.7 g/dL   RDW 86.5 88.3 - 84.5 %   Platelets 150 150 - 450 x10E3/uL   Neutrophils 64 Not Estab. %   Lymphs 28 Not Estab. %  Monocytes 6 Not Estab. %   Eos 2 Not Estab. %   Basos 0 Not Estab. %   Neutrophils Absolute 2.9 1.4 - 7.0 x10E3/uL   Lymphocytes Absolute 1.3 0.7 - 3.1 x10E3/uL   Monocytes Absolute 0.3 0.1 - 0.9 x10E3/uL   EOS (ABSOLUTE) 0.1 0.0 - 0.4 x10E3/uL   Basophils Absolute 0.0 0.0 - 0.2 x10E3/uL   Immature Granulocytes 0 Not Estab. %   Immature Grans (Abs) 0.0 0.0 - 0.1 x10E3/uL  Lipid Panel w/o Chol/HDL Ratio   Collection Time: 07/09/23  1:24 PM  Result Value Ref Range   Cholesterol, Total 108 100 - 199 mg/dL   Triglycerides 95 0 - 149 mg/dL   HDL 44 >60 mg/dL   VLDL Cholesterol Cal 18 5 - 40 mg/dL   LDL Chol Calc (NIH) 46 0 - 99 mg/dL  PSA   Collection Time: 07/09/23  1:24 PM  Result Value Ref Range   Prostate Specific Ag, Serum 2.7 0.0 - 4.0 ng/mL  TSH   Collection Time: 07/09/23  1:24 PM  Result Value Ref Range   TSH 1.200 0.450 - 4.500 uIU/mL      Assessment & Plan:   Problem List Items Addressed This Visit       Cardiovascular and Mediastinum   Essential hypertension   Under good control on current regimen. Continue current regimen. Continue to monitor. Call with any concerns. Refills given. Labs drawn today.        Relevant Medications   benazepril  (LOTENSIN ) 10 MG tablet   rosuvastatin  (CRESTOR ) 20 MG tablet   Other Relevant Orders   Comprehensive metabolic panel with GFR     Digestive   Recurrent cold sores   Under good control on current regimen. Continue current regimen. Continue to monitor. Call with any concerns. Refills given. Labs drawn today.       Relevant Medications   valACYclovir  (VALTREX ) 500 MG tablet   Other Relevant Orders   Comprehensive metabolic panel with GFR   Reflux esophagitis - Primary   Will treat with omeprazole . Call with any concerns or if not getting better.        Relevant Orders   CBC with Differential/Platelet   Comprehensive metabolic panel with GFR     Genitourinary   Benign prostatic hyperplasia with urinary obstruction   Under good control on current regimen. Continue current regimen. Continue to monitor. Call with any concerns. Refills given. Labs drawn today.       Relevant Medications   tamsulosin  (FLOMAX ) 0.4 MG CAPS capsule   Other Relevant Orders   Comprehensive metabolic panel with GFR   PSA   CKD (chronic kidney disease) stage 3, GFR 30-59 ml/min (HCC)   Rechecking labs today. Await results. Treat as needed.       Relevant Orders   Comprehensive metabolic panel with GFR     Other   Obesity, morbid (HCC)    Due to HTN and HLD- will work on diet and exercise with goal of losing 1-2lbs per week.       Mixed hyperlipidemia   Under good control on current regimen. Continue current regimen. Continue to monitor. Call with any concerns. Refills given. Labs drawn today.       Relevant Medications   benazepril  (LOTENSIN ) 10 MG tablet   rosuvastatin  (CRESTOR ) 20 MG tablet   Other Relevant Orders   Lipid Panel w/o Chol/HDL Ratio   Comprehensive metabolic panel with GFR   Other Visit Diagnoses  Left leg pain       Will start stretches and naproxen . Call if not getting better or getting worse. Continue to monitor.        Follow up plan: Return in about 6 months (around 07/21/2024) for physical.      "

## 2024-01-22 NOTE — Assessment & Plan Note (Signed)
"   Due to HTN and HLD- will work on diet and exercise with goal of losing 1-2lbs per week.  "

## 2024-01-22 NOTE — Assessment & Plan Note (Signed)
 Rechecking labs today. Await results. Treat as needed.

## 2024-01-22 NOTE — Assessment & Plan Note (Signed)
 Will treat with omeprazole . Call with any concerns or if not getting better.

## 2024-01-23 ENCOUNTER — Ambulatory Visit: Payer: Self-pay | Admitting: Family Medicine

## 2024-01-23 LAB — CBC WITH DIFFERENTIAL/PLATELET
Basophils Absolute: 0 x10E3/uL (ref 0.0–0.2)
Basos: 0 %
EOS (ABSOLUTE): 0.1 x10E3/uL (ref 0.0–0.4)
Eos: 2 %
Hematocrit: 46 % (ref 37.5–51.0)
Hemoglobin: 15.3 g/dL (ref 13.0–17.7)
Immature Grans (Abs): 0 x10E3/uL (ref 0.0–0.1)
Immature Granulocytes: 0 %
Lymphocytes Absolute: 1.1 x10E3/uL (ref 0.7–3.1)
Lymphs: 24 %
MCH: 30.2 pg (ref 26.6–33.0)
MCHC: 33.3 g/dL (ref 31.5–35.7)
MCV: 91 fL (ref 79–97)
Monocytes Absolute: 0.4 x10E3/uL (ref 0.1–0.9)
Monocytes: 9 %
Neutrophils Absolute: 3 x10E3/uL (ref 1.4–7.0)
Neutrophils: 65 %
Platelets: 160 x10E3/uL (ref 150–450)
RBC: 5.07 x10E6/uL (ref 4.14–5.80)
RDW: 13.2 % (ref 11.6–15.4)
WBC: 4.6 x10E3/uL (ref 3.4–10.8)

## 2024-01-23 LAB — COMPREHENSIVE METABOLIC PANEL WITH GFR
ALT: 28 IU/L (ref 0–44)
AST: 18 IU/L (ref 0–40)
Albumin: 4.5 g/dL (ref 3.8–4.8)
Alkaline Phosphatase: 63 IU/L (ref 47–123)
BUN/Creatinine Ratio: 17 (ref 10–24)
BUN: 19 mg/dL (ref 8–27)
Bilirubin Total: 0.4 mg/dL (ref 0.0–1.2)
CO2: 22 mmol/L (ref 20–29)
Calcium: 9.6 mg/dL (ref 8.6–10.2)
Chloride: 104 mmol/L (ref 96–106)
Creatinine, Ser: 1.12 mg/dL (ref 0.76–1.27)
Globulin, Total: 1.8 g/dL (ref 1.5–4.5)
Glucose: 81 mg/dL (ref 70–99)
Potassium: 4.5 mmol/L (ref 3.5–5.2)
Sodium: 142 mmol/L (ref 134–144)
Total Protein: 6.3 g/dL (ref 6.0–8.5)
eGFR: 68 mL/min/1.73

## 2024-01-23 LAB — LIPID PANEL W/O CHOL/HDL RATIO
Cholesterol, Total: 113 mg/dL (ref 100–199)
HDL: 44 mg/dL
LDL Chol Calc (NIH): 53 mg/dL (ref 0–99)
Triglycerides: 78 mg/dL (ref 0–149)
VLDL Cholesterol Cal: 16 mg/dL (ref 5–40)

## 2024-01-23 LAB — PSA: Prostate Specific Ag, Serum: 2.5 ng/mL (ref 0.0–4.0)

## 2024-04-22 ENCOUNTER — Ambulatory Visit

## 2024-07-23 ENCOUNTER — Encounter: Admitting: Family Medicine

## 2024-11-27 ENCOUNTER — Ambulatory Visit: Payer: Medicare (Managed Care) | Admitting: Dermatology
